# Patient Record
Sex: Male | Born: 1945
Health system: Southern US, Community
[De-identification: ages and names within clinical notes are randomized; demographics above are authoritative.]

## PROBLEM LIST (undated history)

## (undated) DIAGNOSIS — I2510 Atherosclerotic heart disease of native coronary artery without angina pectoris: Secondary | ICD-10-CM

## (undated) DIAGNOSIS — F32A Depression, unspecified: Secondary | ICD-10-CM

## (undated) DIAGNOSIS — G20A1 Parkinson's disease without dyskinesia, without mention of fluctuations: Secondary | ICD-10-CM

## (undated) DIAGNOSIS — I4891 Unspecified atrial fibrillation: Secondary | ICD-10-CM

## (undated) DIAGNOSIS — R0600 Dyspnea, unspecified: Secondary | ICD-10-CM

## (undated) DIAGNOSIS — F329 Major depressive disorder, single episode, unspecified: Secondary | ICD-10-CM

## (undated) DIAGNOSIS — M1990 Unspecified osteoarthritis, unspecified site: Secondary | ICD-10-CM

## (undated) DIAGNOSIS — I499 Cardiac arrhythmia, unspecified: Secondary | ICD-10-CM

## (undated) DIAGNOSIS — E119 Type 2 diabetes mellitus without complications: Secondary | ICD-10-CM

## (undated) DIAGNOSIS — I639 Cerebral infarction, unspecified: Secondary | ICD-10-CM

## (undated) DIAGNOSIS — F419 Anxiety disorder, unspecified: Secondary | ICD-10-CM

## (undated) DIAGNOSIS — K219 Gastro-esophageal reflux disease without esophagitis: Secondary | ICD-10-CM

## (undated) DIAGNOSIS — E785 Hyperlipidemia, unspecified: Secondary | ICD-10-CM

## (undated) DIAGNOSIS — J189 Pneumonia, unspecified organism: Secondary | ICD-10-CM

## (undated) DIAGNOSIS — G20 Parkinson's disease: Secondary | ICD-10-CM

## (undated) DIAGNOSIS — G4730 Sleep apnea, unspecified: Secondary | ICD-10-CM

## (undated) DIAGNOSIS — G709 Myoneural disorder, unspecified: Secondary | ICD-10-CM

## (undated) DIAGNOSIS — C801 Malignant (primary) neoplasm, unspecified: Secondary | ICD-10-CM

## (undated) DIAGNOSIS — I10 Essential (primary) hypertension: Secondary | ICD-10-CM

## (undated) DIAGNOSIS — I509 Heart failure, unspecified: Secondary | ICD-10-CM

## (undated) HISTORY — PX: SKIN CANCER EXCISION: SHX779

## (undated) HISTORY — DX: Atherosclerotic heart disease of native coronary artery without angina pectoris: I25.10

## (undated) HISTORY — DX: Malignant (primary) neoplasm, unspecified: C80.1

## (undated) HISTORY — DX: Essential (primary) hypertension: I10

## (undated) HISTORY — PX: ANKLE FUSION: SHX881

## (undated) HISTORY — DX: Hyperlipidemia, unspecified: E78.5

## (undated) HISTORY — DX: Type 2 diabetes mellitus without complications: E11.9

## (undated) HISTORY — DX: Parkinson's disease without dyskinesia, without mention of fluctuations: G20.A1

## (undated) HISTORY — DX: Cerebral infarction, unspecified: I63.9

## (undated) HISTORY — PX: EYE SURGERY: SHX253

## (undated) HISTORY — DX: Heart failure, unspecified: I50.9

## (undated) HISTORY — DX: Parkinson's disease: G20

## (undated) HISTORY — PX: REPLACEMENT TOTAL KNEE BILATERAL: SUR1225

## (undated) HISTORY — PX: CARDIAC CATHETERIZATION: SHX172

## (undated) HISTORY — DX: Unspecified atrial fibrillation: I48.91

---

## 2014-06-23 HISTORY — PX: JOINT REPLACEMENT: SHX530

## 2014-06-23 HISTORY — PX: LUNG LOBECTOMY: SHX167

## 2014-07-18 DIAGNOSIS — Q6100 Congenital renal cyst, unspecified: Secondary | ICD-10-CM | POA: Diagnosis not present

## 2014-07-18 DIAGNOSIS — L900 Lichen sclerosus et atrophicus: Secondary | ICD-10-CM | POA: Diagnosis not present

## 2014-07-18 DIAGNOSIS — N3941 Urge incontinence: Secondary | ICD-10-CM | POA: Diagnosis not present

## 2014-07-18 DIAGNOSIS — R3915 Urgency of urination: Secondary | ICD-10-CM | POA: Diagnosis not present

## 2014-07-18 DIAGNOSIS — R350 Frequency of micturition: Secondary | ICD-10-CM | POA: Diagnosis not present

## 2014-07-18 DIAGNOSIS — R358 Other polyuria: Secondary | ICD-10-CM | POA: Diagnosis not present

## 2014-07-18 DIAGNOSIS — R351 Nocturia: Secondary | ICD-10-CM | POA: Diagnosis not present

## 2014-07-18 DIAGNOSIS — Q5564 Hidden penis: Secondary | ICD-10-CM | POA: Diagnosis not present

## 2014-07-27 DIAGNOSIS — N4883 Acquired buried penis: Secondary | ICD-10-CM | POA: Diagnosis not present

## 2014-08-08 DIAGNOSIS — R918 Other nonspecific abnormal finding of lung field: Secondary | ICD-10-CM | POA: Diagnosis not present

## 2014-08-08 DIAGNOSIS — C3492 Malignant neoplasm of unspecified part of left bronchus or lung: Secondary | ICD-10-CM | POA: Diagnosis not present

## 2014-08-08 DIAGNOSIS — E039 Hypothyroidism, unspecified: Secondary | ICD-10-CM | POA: Diagnosis not present

## 2014-08-08 DIAGNOSIS — R0602 Shortness of breath: Secondary | ICD-10-CM | POA: Diagnosis not present

## 2014-08-08 DIAGNOSIS — I509 Heart failure, unspecified: Secondary | ICD-10-CM | POA: Diagnosis not present

## 2014-08-08 DIAGNOSIS — Z902 Acquired absence of lung [part of]: Secondary | ICD-10-CM | POA: Diagnosis not present

## 2014-08-08 DIAGNOSIS — Z7982 Long term (current) use of aspirin: Secondary | ICD-10-CM | POA: Diagnosis not present

## 2014-08-08 DIAGNOSIS — C3412 Malignant neoplasm of upper lobe, left bronchus or lung: Secondary | ICD-10-CM | POA: Diagnosis not present

## 2014-08-08 DIAGNOSIS — Z8589 Personal history of malignant neoplasm of other organs and systems: Secondary | ICD-10-CM | POA: Diagnosis not present

## 2014-08-08 DIAGNOSIS — E780 Pure hypercholesterolemia: Secondary | ICD-10-CM | POA: Diagnosis not present

## 2014-08-08 DIAGNOSIS — E119 Type 2 diabetes mellitus without complications: Secondary | ICD-10-CM | POA: Diagnosis not present

## 2014-08-08 DIAGNOSIS — I2510 Atherosclerotic heart disease of native coronary artery without angina pectoris: Secondary | ICD-10-CM | POA: Diagnosis not present

## 2014-08-08 DIAGNOSIS — R05 Cough: Secondary | ICD-10-CM | POA: Diagnosis not present

## 2014-08-17 DIAGNOSIS — R358 Other polyuria: Secondary | ICD-10-CM | POA: Diagnosis not present

## 2014-08-17 DIAGNOSIS — Q5564 Hidden penis: Secondary | ICD-10-CM | POA: Diagnosis not present

## 2014-08-17 DIAGNOSIS — L900 Lichen sclerosus et atrophicus: Secondary | ICD-10-CM | POA: Diagnosis not present

## 2014-08-17 DIAGNOSIS — R351 Nocturia: Secondary | ICD-10-CM | POA: Diagnosis not present

## 2014-08-17 DIAGNOSIS — E669 Obesity, unspecified: Secondary | ICD-10-CM | POA: Diagnosis not present

## 2014-08-17 DIAGNOSIS — R3915 Urgency of urination: Secondary | ICD-10-CM | POA: Diagnosis not present

## 2014-08-17 DIAGNOSIS — R350 Frequency of micturition: Secondary | ICD-10-CM | POA: Diagnosis not present

## 2014-08-17 DIAGNOSIS — N3941 Urge incontinence: Secondary | ICD-10-CM | POA: Diagnosis not present

## 2014-08-30 DIAGNOSIS — I2510 Atherosclerotic heart disease of native coronary artery without angina pectoris: Secondary | ICD-10-CM | POA: Diagnosis not present

## 2014-08-30 DIAGNOSIS — I4891 Unspecified atrial fibrillation: Secondary | ICD-10-CM | POA: Diagnosis not present

## 2014-09-14 DIAGNOSIS — I4891 Unspecified atrial fibrillation: Secondary | ICD-10-CM | POA: Diagnosis not present

## 2014-09-14 DIAGNOSIS — I2510 Atherosclerotic heart disease of native coronary artery without angina pectoris: Secondary | ICD-10-CM | POA: Diagnosis not present

## 2014-12-05 DIAGNOSIS — I10 Essential (primary) hypertension: Secondary | ICD-10-CM | POA: Diagnosis not present

## 2014-12-05 DIAGNOSIS — E119 Type 2 diabetes mellitus without complications: Secondary | ICD-10-CM | POA: Diagnosis not present

## 2014-12-05 DIAGNOSIS — E780 Pure hypercholesterolemia: Secondary | ICD-10-CM | POA: Diagnosis not present

## 2014-12-05 DIAGNOSIS — Z7982 Long term (current) use of aspirin: Secondary | ICD-10-CM | POA: Diagnosis not present

## 2014-12-05 DIAGNOSIS — Z0389 Encounter for observation for other suspected diseases and conditions ruled out: Secondary | ICD-10-CM | POA: Diagnosis not present

## 2014-12-05 DIAGNOSIS — C3492 Malignant neoplasm of unspecified part of left bronchus or lung: Secondary | ICD-10-CM | POA: Diagnosis not present

## 2014-12-05 DIAGNOSIS — R0602 Shortness of breath: Secondary | ICD-10-CM | POA: Diagnosis not present

## 2014-12-05 DIAGNOSIS — R918 Other nonspecific abnormal finding of lung field: Secondary | ICD-10-CM | POA: Diagnosis not present

## 2014-12-05 DIAGNOSIS — Z8673 Personal history of transient ischemic attack (TIA), and cerebral infarction without residual deficits: Secondary | ICD-10-CM | POA: Diagnosis not present

## 2014-12-05 DIAGNOSIS — Z86718 Personal history of other venous thrombosis and embolism: Secondary | ICD-10-CM | POA: Diagnosis not present

## 2014-12-05 DIAGNOSIS — Z7901 Long term (current) use of anticoagulants: Secondary | ICD-10-CM | POA: Diagnosis not present

## 2014-12-05 DIAGNOSIS — I509 Heart failure, unspecified: Secondary | ICD-10-CM | POA: Diagnosis not present

## 2014-12-05 DIAGNOSIS — E039 Hypothyroidism, unspecified: Secondary | ICD-10-CM | POA: Diagnosis not present

## 2014-12-05 DIAGNOSIS — I4891 Unspecified atrial fibrillation: Secondary | ICD-10-CM | POA: Diagnosis not present

## 2015-01-04 DIAGNOSIS — Q5564 Hidden penis: Secondary | ICD-10-CM | POA: Diagnosis not present

## 2015-01-04 DIAGNOSIS — L900 Lichen sclerosus et atrophicus: Secondary | ICD-10-CM | POA: Diagnosis not present

## 2015-01-04 DIAGNOSIS — R3915 Urgency of urination: Secondary | ICD-10-CM | POA: Diagnosis not present

## 2015-01-04 DIAGNOSIS — N3941 Urge incontinence: Secondary | ICD-10-CM | POA: Diagnosis not present

## 2015-01-04 DIAGNOSIS — E669 Obesity, unspecified: Secondary | ICD-10-CM | POA: Diagnosis not present

## 2015-01-04 DIAGNOSIS — R350 Frequency of micturition: Secondary | ICD-10-CM | POA: Diagnosis not present

## 2015-01-04 DIAGNOSIS — R351 Nocturia: Secondary | ICD-10-CM | POA: Diagnosis not present

## 2015-01-17 DIAGNOSIS — Q5564 Hidden penis: Secondary | ICD-10-CM | POA: Diagnosis not present

## 2015-02-06 DIAGNOSIS — E1165 Type 2 diabetes mellitus with hyperglycemia: Secondary | ICD-10-CM | POA: Diagnosis not present

## 2015-02-06 DIAGNOSIS — Z7901 Long term (current) use of anticoagulants: Secondary | ICD-10-CM | POA: Diagnosis not present

## 2015-02-06 DIAGNOSIS — I10 Essential (primary) hypertension: Secondary | ICD-10-CM | POA: Diagnosis not present

## 2015-02-06 DIAGNOSIS — E782 Mixed hyperlipidemia: Secondary | ICD-10-CM | POA: Diagnosis not present

## 2015-02-06 DIAGNOSIS — I259 Chronic ischemic heart disease, unspecified: Secondary | ICD-10-CM | POA: Diagnosis not present

## 2015-02-06 DIAGNOSIS — I4891 Unspecified atrial fibrillation: Secondary | ICD-10-CM | POA: Diagnosis not present

## 2015-02-28 DIAGNOSIS — M19012 Primary osteoarthritis, left shoulder: Secondary | ICD-10-CM | POA: Diagnosis not present

## 2015-04-12 DIAGNOSIS — Z23 Encounter for immunization: Secondary | ICD-10-CM | POA: Diagnosis not present

## 2015-04-12 DIAGNOSIS — E1165 Type 2 diabetes mellitus with hyperglycemia: Secondary | ICD-10-CM | POA: Diagnosis not present

## 2015-04-12 DIAGNOSIS — J209 Acute bronchitis, unspecified: Secondary | ICD-10-CM | POA: Diagnosis not present

## 2015-04-12 DIAGNOSIS — Z125 Encounter for screening for malignant neoplasm of prostate: Secondary | ICD-10-CM | POA: Diagnosis not present

## 2015-04-12 DIAGNOSIS — Z7901 Long term (current) use of anticoagulants: Secondary | ICD-10-CM | POA: Diagnosis not present

## 2015-04-25 DIAGNOSIS — I10 Essential (primary) hypertension: Secondary | ICD-10-CM | POA: Insufficient documentation

## 2015-04-25 DIAGNOSIS — E782 Mixed hyperlipidemia: Secondary | ICD-10-CM | POA: Insufficient documentation

## 2015-04-25 DIAGNOSIS — E119 Type 2 diabetes mellitus without complications: Secondary | ICD-10-CM | POA: Insufficient documentation

## 2015-04-25 DIAGNOSIS — I4891 Unspecified atrial fibrillation: Secondary | ICD-10-CM

## 2015-04-25 DIAGNOSIS — C3492 Malignant neoplasm of unspecified part of left bronchus or lung: Secondary | ICD-10-CM

## 2015-04-25 DIAGNOSIS — I482 Chronic atrial fibrillation, unspecified: Secondary | ICD-10-CM

## 2015-04-25 HISTORY — DX: Malignant neoplasm of unspecified part of left bronchus or lung: C34.92

## 2015-04-25 HISTORY — DX: Mixed hyperlipidemia: E78.2

## 2015-04-25 HISTORY — DX: Essential (primary) hypertension: I10

## 2015-04-25 HISTORY — DX: Type 2 diabetes mellitus without complications: E11.9

## 2015-04-25 HISTORY — DX: Unspecified atrial fibrillation: I48.91

## 2015-04-25 HISTORY — DX: Chronic atrial fibrillation, unspecified: I48.20

## 2015-05-15 DIAGNOSIS — S8012XA Contusion of left lower leg, initial encounter: Secondary | ICD-10-CM | POA: Diagnosis not present

## 2015-05-22 DIAGNOSIS — I482 Chronic atrial fibrillation: Secondary | ICD-10-CM | POA: Diagnosis not present

## 2015-06-05 DIAGNOSIS — E7800 Pure hypercholesterolemia, unspecified: Secondary | ICD-10-CM | POA: Diagnosis not present

## 2015-06-05 DIAGNOSIS — Z7984 Long term (current) use of oral hypoglycemic drugs: Secondary | ICD-10-CM | POA: Diagnosis not present

## 2015-06-05 DIAGNOSIS — I2510 Atherosclerotic heart disease of native coronary artery without angina pectoris: Secondary | ICD-10-CM | POA: Diagnosis not present

## 2015-06-05 DIAGNOSIS — Z08 Encounter for follow-up examination after completed treatment for malignant neoplasm: Secondary | ICD-10-CM | POA: Diagnosis not present

## 2015-06-05 DIAGNOSIS — E039 Hypothyroidism, unspecified: Secondary | ICD-10-CM | POA: Diagnosis not present

## 2015-06-05 DIAGNOSIS — I110 Hypertensive heart disease with heart failure: Secondary | ICD-10-CM | POA: Diagnosis not present

## 2015-06-05 DIAGNOSIS — Z86711 Personal history of pulmonary embolism: Secondary | ICD-10-CM | POA: Diagnosis not present

## 2015-06-05 DIAGNOSIS — Z7982 Long term (current) use of aspirin: Secondary | ICD-10-CM | POA: Diagnosis not present

## 2015-06-05 DIAGNOSIS — Z902 Acquired absence of lung [part of]: Secondary | ICD-10-CM | POA: Diagnosis not present

## 2015-06-05 DIAGNOSIS — E119 Type 2 diabetes mellitus without complications: Secondary | ICD-10-CM | POA: Diagnosis not present

## 2015-06-05 DIAGNOSIS — Z85118 Personal history of other malignant neoplasm of bronchus and lung: Secondary | ICD-10-CM | POA: Diagnosis not present

## 2015-06-05 DIAGNOSIS — Z7901 Long term (current) use of anticoagulants: Secondary | ICD-10-CM | POA: Diagnosis not present

## 2015-06-05 DIAGNOSIS — Z9221 Personal history of antineoplastic chemotherapy: Secondary | ICD-10-CM | POA: Diagnosis not present

## 2015-06-05 DIAGNOSIS — Z923 Personal history of irradiation: Secondary | ICD-10-CM | POA: Diagnosis not present

## 2015-06-05 DIAGNOSIS — I509 Heart failure, unspecified: Secondary | ICD-10-CM | POA: Diagnosis not present

## 2015-06-05 DIAGNOSIS — I4891 Unspecified atrial fibrillation: Secondary | ICD-10-CM | POA: Diagnosis not present

## 2015-06-05 DIAGNOSIS — C3492 Malignant neoplasm of unspecified part of left bronchus or lung: Secondary | ICD-10-CM | POA: Diagnosis not present

## 2015-07-09 DIAGNOSIS — Z5181 Encounter for therapeutic drug level monitoring: Secondary | ICD-10-CM | POA: Diagnosis not present

## 2015-07-09 DIAGNOSIS — E782 Mixed hyperlipidemia: Secondary | ICD-10-CM | POA: Diagnosis not present

## 2015-07-09 DIAGNOSIS — N390 Urinary tract infection, site not specified: Secondary | ICD-10-CM | POA: Diagnosis not present

## 2015-07-09 DIAGNOSIS — E1165 Type 2 diabetes mellitus with hyperglycemia: Secondary | ICD-10-CM | POA: Diagnosis not present

## 2015-07-09 DIAGNOSIS — I10 Essential (primary) hypertension: Secondary | ICD-10-CM | POA: Diagnosis not present

## 2015-08-02 DIAGNOSIS — M19012 Primary osteoarthritis, left shoulder: Secondary | ICD-10-CM | POA: Diagnosis not present

## 2015-08-06 DIAGNOSIS — M19012 Primary osteoarthritis, left shoulder: Secondary | ICD-10-CM | POA: Diagnosis not present

## 2015-08-06 DIAGNOSIS — M7552 Bursitis of left shoulder: Secondary | ICD-10-CM | POA: Diagnosis not present

## 2015-08-06 DIAGNOSIS — M25412 Effusion, left shoulder: Secondary | ICD-10-CM | POA: Diagnosis not present

## 2015-08-06 DIAGNOSIS — Z79899 Other long term (current) drug therapy: Secondary | ICD-10-CM | POA: Diagnosis not present

## 2015-08-06 DIAGNOSIS — Z01818 Encounter for other preprocedural examination: Secondary | ICD-10-CM | POA: Diagnosis not present

## 2015-08-06 DIAGNOSIS — M25512 Pain in left shoulder: Secondary | ICD-10-CM | POA: Diagnosis not present

## 2015-08-06 DIAGNOSIS — Z01810 Encounter for preprocedural cardiovascular examination: Secondary | ICD-10-CM | POA: Diagnosis not present

## 2015-08-06 DIAGNOSIS — E559 Vitamin D deficiency, unspecified: Secondary | ICD-10-CM | POA: Diagnosis not present

## 2015-08-06 DIAGNOSIS — M79609 Pain in unspecified limb: Secondary | ICD-10-CM | POA: Diagnosis not present

## 2015-08-07 DIAGNOSIS — Z01818 Encounter for other preprocedural examination: Secondary | ICD-10-CM | POA: Diagnosis not present

## 2015-08-15 DIAGNOSIS — R52 Pain, unspecified: Secondary | ICD-10-CM | POA: Diagnosis not present

## 2015-08-15 DIAGNOSIS — Z01818 Encounter for other preprocedural examination: Secondary | ICD-10-CM | POA: Diagnosis not present

## 2015-08-15 DIAGNOSIS — E559 Vitamin D deficiency, unspecified: Secondary | ICD-10-CM | POA: Diagnosis not present

## 2015-08-15 DIAGNOSIS — Z79899 Other long term (current) drug therapy: Secondary | ICD-10-CM | POA: Diagnosis not present

## 2015-08-20 DIAGNOSIS — E119 Type 2 diabetes mellitus without complications: Secondary | ICD-10-CM | POA: Diagnosis not present

## 2015-08-20 DIAGNOSIS — I10 Essential (primary) hypertension: Secondary | ICD-10-CM | POA: Diagnosis not present

## 2015-08-20 DIAGNOSIS — I482 Chronic atrial fibrillation: Secondary | ICD-10-CM | POA: Diagnosis not present

## 2015-08-20 DIAGNOSIS — E782 Mixed hyperlipidemia: Secondary | ICD-10-CM | POA: Diagnosis not present

## 2015-08-20 DIAGNOSIS — Z87891 Personal history of nicotine dependence: Secondary | ICD-10-CM | POA: Insufficient documentation

## 2015-08-20 DIAGNOSIS — C3492 Malignant neoplasm of unspecified part of left bronchus or lung: Secondary | ICD-10-CM | POA: Diagnosis not present

## 2015-08-20 DIAGNOSIS — Z01810 Encounter for preprocedural cardiovascular examination: Secondary | ICD-10-CM | POA: Diagnosis not present

## 2015-08-20 HISTORY — DX: Personal history of nicotine dependence: Z87.891

## 2015-08-23 DIAGNOSIS — Z87891 Personal history of nicotine dependence: Secondary | ICD-10-CM | POA: Diagnosis not present

## 2015-08-23 DIAGNOSIS — I482 Chronic atrial fibrillation: Secondary | ICD-10-CM | POA: Diagnosis not present

## 2015-08-23 DIAGNOSIS — Z01810 Encounter for preprocedural cardiovascular examination: Secondary | ICD-10-CM | POA: Diagnosis not present

## 2015-08-23 DIAGNOSIS — E119 Type 2 diabetes mellitus without complications: Secondary | ICD-10-CM | POA: Diagnosis not present

## 2015-08-23 DIAGNOSIS — I10 Essential (primary) hypertension: Secondary | ICD-10-CM | POA: Diagnosis not present

## 2015-08-23 DIAGNOSIS — C3492 Malignant neoplasm of unspecified part of left bronchus or lung: Secondary | ICD-10-CM | POA: Diagnosis not present

## 2015-08-23 DIAGNOSIS — E782 Mixed hyperlipidemia: Secondary | ICD-10-CM | POA: Diagnosis not present

## 2015-08-30 DIAGNOSIS — I10 Essential (primary) hypertension: Secondary | ICD-10-CM | POA: Diagnosis not present

## 2015-08-30 DIAGNOSIS — I482 Chronic atrial fibrillation: Secondary | ICD-10-CM | POA: Diagnosis not present

## 2015-08-30 DIAGNOSIS — Z01810 Encounter for preprocedural cardiovascular examination: Secondary | ICD-10-CM | POA: Diagnosis not present

## 2015-08-30 DIAGNOSIS — C3492 Malignant neoplasm of unspecified part of left bronchus or lung: Secondary | ICD-10-CM | POA: Diagnosis not present

## 2015-08-30 DIAGNOSIS — E119 Type 2 diabetes mellitus without complications: Secondary | ICD-10-CM | POA: Diagnosis not present

## 2015-08-30 DIAGNOSIS — Z87891 Personal history of nicotine dependence: Secondary | ICD-10-CM | POA: Diagnosis not present

## 2015-08-30 DIAGNOSIS — E782 Mixed hyperlipidemia: Secondary | ICD-10-CM | POA: Diagnosis not present

## 2015-09-05 DIAGNOSIS — E782 Mixed hyperlipidemia: Secondary | ICD-10-CM | POA: Diagnosis not present

## 2015-09-05 DIAGNOSIS — C3492 Malignant neoplasm of unspecified part of left bronchus or lung: Secondary | ICD-10-CM | POA: Diagnosis not present

## 2015-09-05 DIAGNOSIS — I25118 Atherosclerotic heart disease of native coronary artery with other forms of angina pectoris: Secondary | ICD-10-CM | POA: Diagnosis not present

## 2015-09-05 DIAGNOSIS — Z87891 Personal history of nicotine dependence: Secondary | ICD-10-CM | POA: Diagnosis not present

## 2015-09-05 DIAGNOSIS — E119 Type 2 diabetes mellitus without complications: Secondary | ICD-10-CM | POA: Diagnosis not present

## 2015-09-05 DIAGNOSIS — I10 Essential (primary) hypertension: Secondary | ICD-10-CM | POA: Diagnosis not present

## 2015-09-05 DIAGNOSIS — I501 Left ventricular failure: Secondary | ICD-10-CM | POA: Diagnosis not present

## 2015-09-05 DIAGNOSIS — Z9889 Other specified postprocedural states: Secondary | ICD-10-CM | POA: Diagnosis not present

## 2015-09-05 DIAGNOSIS — I482 Chronic atrial fibrillation: Secondary | ICD-10-CM | POA: Diagnosis not present

## 2015-09-05 DIAGNOSIS — Z7984 Long term (current) use of oral hypoglycemic drugs: Secondary | ICD-10-CM | POA: Diagnosis not present

## 2015-09-05 DIAGNOSIS — Z7982 Long term (current) use of aspirin: Secondary | ICD-10-CM | POA: Diagnosis not present

## 2015-09-05 DIAGNOSIS — Z79899 Other long term (current) drug therapy: Secondary | ICD-10-CM | POA: Diagnosis not present

## 2015-09-06 DIAGNOSIS — I482 Chronic atrial fibrillation: Secondary | ICD-10-CM | POA: Diagnosis not present

## 2015-09-06 DIAGNOSIS — I25118 Atherosclerotic heart disease of native coronary artery with other forms of angina pectoris: Secondary | ICD-10-CM | POA: Diagnosis not present

## 2015-09-06 DIAGNOSIS — E119 Type 2 diabetes mellitus without complications: Secondary | ICD-10-CM | POA: Diagnosis not present

## 2015-09-06 DIAGNOSIS — I10 Essential (primary) hypertension: Secondary | ICD-10-CM | POA: Diagnosis not present

## 2015-09-06 DIAGNOSIS — E782 Mixed hyperlipidemia: Secondary | ICD-10-CM | POA: Diagnosis not present

## 2015-09-06 DIAGNOSIS — I2510 Atherosclerotic heart disease of native coronary artery without angina pectoris: Secondary | ICD-10-CM | POA: Diagnosis not present

## 2015-09-06 DIAGNOSIS — Z87891 Personal history of nicotine dependence: Secondary | ICD-10-CM | POA: Diagnosis not present

## 2015-09-07 DIAGNOSIS — I482 Chronic atrial fibrillation: Secondary | ICD-10-CM | POA: Diagnosis not present

## 2015-09-07 DIAGNOSIS — I2510 Atherosclerotic heart disease of native coronary artery without angina pectoris: Secondary | ICD-10-CM | POA: Insufficient documentation

## 2015-09-07 DIAGNOSIS — E119 Type 2 diabetes mellitus without complications: Secondary | ICD-10-CM | POA: Diagnosis not present

## 2015-09-07 DIAGNOSIS — I10 Essential (primary) hypertension: Secondary | ICD-10-CM | POA: Diagnosis not present

## 2015-09-07 DIAGNOSIS — E782 Mixed hyperlipidemia: Secondary | ICD-10-CM | POA: Diagnosis not present

## 2015-09-07 DIAGNOSIS — I25118 Atherosclerotic heart disease of native coronary artery with other forms of angina pectoris: Secondary | ICD-10-CM | POA: Diagnosis not present

## 2015-09-07 DIAGNOSIS — Z87891 Personal history of nicotine dependence: Secondary | ICD-10-CM | POA: Diagnosis not present

## 2015-09-07 HISTORY — DX: Atherosclerotic heart disease of native coronary artery without angina pectoris: I25.10

## 2015-09-12 DIAGNOSIS — I482 Chronic atrial fibrillation: Secondary | ICD-10-CM | POA: Diagnosis not present

## 2015-09-12 DIAGNOSIS — Z7901 Long term (current) use of anticoagulants: Secondary | ICD-10-CM | POA: Diagnosis not present

## 2015-09-14 DIAGNOSIS — Z7901 Long term (current) use of anticoagulants: Secondary | ICD-10-CM | POA: Diagnosis not present

## 2015-09-14 DIAGNOSIS — I482 Chronic atrial fibrillation: Secondary | ICD-10-CM | POA: Diagnosis not present

## 2015-09-17 DIAGNOSIS — Z7901 Long term (current) use of anticoagulants: Secondary | ICD-10-CM | POA: Diagnosis not present

## 2015-09-17 DIAGNOSIS — I482 Chronic atrial fibrillation: Secondary | ICD-10-CM | POA: Diagnosis not present

## 2015-09-20 DIAGNOSIS — E119 Type 2 diabetes mellitus without complications: Secondary | ICD-10-CM | POA: Diagnosis not present

## 2015-09-20 DIAGNOSIS — Z794 Long term (current) use of insulin: Secondary | ICD-10-CM | POA: Diagnosis not present

## 2015-09-20 DIAGNOSIS — I10 Essential (primary) hypertension: Secondary | ICD-10-CM | POA: Diagnosis not present

## 2015-09-20 DIAGNOSIS — I482 Chronic atrial fibrillation: Secondary | ICD-10-CM | POA: Diagnosis not present

## 2015-09-20 DIAGNOSIS — I2510 Atherosclerotic heart disease of native coronary artery without angina pectoris: Secondary | ICD-10-CM | POA: Diagnosis not present

## 2015-10-01 DIAGNOSIS — E119 Type 2 diabetes mellitus without complications: Secondary | ICD-10-CM | POA: Diagnosis not present

## 2015-10-01 DIAGNOSIS — C3492 Malignant neoplasm of unspecified part of left bronchus or lung: Secondary | ICD-10-CM | POA: Diagnosis not present

## 2015-10-01 DIAGNOSIS — Z7901 Long term (current) use of anticoagulants: Secondary | ICD-10-CM | POA: Diagnosis not present

## 2015-10-01 DIAGNOSIS — I2510 Atherosclerotic heart disease of native coronary artery without angina pectoris: Secondary | ICD-10-CM | POA: Diagnosis not present

## 2015-10-01 DIAGNOSIS — Z87891 Personal history of nicotine dependence: Secondary | ICD-10-CM | POA: Diagnosis not present

## 2015-10-01 DIAGNOSIS — I482 Chronic atrial fibrillation: Secondary | ICD-10-CM | POA: Diagnosis not present

## 2015-10-01 DIAGNOSIS — E782 Mixed hyperlipidemia: Secondary | ICD-10-CM | POA: Diagnosis not present

## 2015-10-01 DIAGNOSIS — I10 Essential (primary) hypertension: Secondary | ICD-10-CM | POA: Diagnosis not present

## 2015-10-02 DIAGNOSIS — I2510 Atherosclerotic heart disease of native coronary artery without angina pectoris: Secondary | ICD-10-CM | POA: Diagnosis not present

## 2015-10-02 DIAGNOSIS — R079 Chest pain, unspecified: Secondary | ICD-10-CM | POA: Diagnosis not present

## 2015-10-15 DIAGNOSIS — M19012 Primary osteoarthritis, left shoulder: Secondary | ICD-10-CM | POA: Diagnosis not present

## 2015-10-22 DIAGNOSIS — D513 Other dietary vitamin B12 deficiency anemia: Secondary | ICD-10-CM | POA: Diagnosis not present

## 2015-10-22 DIAGNOSIS — E119 Type 2 diabetes mellitus without complications: Secondary | ICD-10-CM | POA: Diagnosis not present

## 2015-10-22 DIAGNOSIS — E782 Mixed hyperlipidemia: Secondary | ICD-10-CM | POA: Diagnosis not present

## 2015-10-22 DIAGNOSIS — I4892 Unspecified atrial flutter: Secondary | ICD-10-CM | POA: Diagnosis not present

## 2015-10-22 DIAGNOSIS — Z87891 Personal history of nicotine dependence: Secondary | ICD-10-CM | POA: Diagnosis not present

## 2015-10-22 DIAGNOSIS — Z7901 Long term (current) use of anticoagulants: Secondary | ICD-10-CM | POA: Diagnosis not present

## 2015-10-22 DIAGNOSIS — C3492 Malignant neoplasm of unspecified part of left bronchus or lung: Secondary | ICD-10-CM | POA: Diagnosis not present

## 2015-10-22 DIAGNOSIS — I2510 Atherosclerotic heart disease of native coronary artery without angina pectoris: Secondary | ICD-10-CM | POA: Diagnosis not present

## 2015-10-22 DIAGNOSIS — R5382 Chronic fatigue, unspecified: Secondary | ICD-10-CM | POA: Diagnosis not present

## 2015-10-22 DIAGNOSIS — I10 Essential (primary) hypertension: Secondary | ICD-10-CM | POA: Diagnosis not present

## 2015-10-22 DIAGNOSIS — I482 Chronic atrial fibrillation: Secondary | ICD-10-CM | POA: Diagnosis not present

## 2015-10-25 DIAGNOSIS — E1165 Type 2 diabetes mellitus with hyperglycemia: Secondary | ICD-10-CM | POA: Diagnosis not present

## 2015-10-25 DIAGNOSIS — I10 Essential (primary) hypertension: Secondary | ICD-10-CM | POA: Diagnosis not present

## 2015-10-25 DIAGNOSIS — I4891 Unspecified atrial fibrillation: Secondary | ICD-10-CM | POA: Diagnosis not present

## 2015-10-25 DIAGNOSIS — J449 Chronic obstructive pulmonary disease, unspecified: Secondary | ICD-10-CM | POA: Diagnosis not present

## 2015-10-25 DIAGNOSIS — E785 Hyperlipidemia, unspecified: Secondary | ICD-10-CM | POA: Diagnosis not present

## 2015-10-25 DIAGNOSIS — G8929 Other chronic pain: Secondary | ICD-10-CM | POA: Diagnosis not present

## 2015-10-25 DIAGNOSIS — F419 Anxiety disorder, unspecified: Secondary | ICD-10-CM | POA: Diagnosis not present

## 2015-11-05 DIAGNOSIS — I482 Chronic atrial fibrillation: Secondary | ICD-10-CM | POA: Diagnosis not present

## 2015-11-05 DIAGNOSIS — Z7901 Long term (current) use of anticoagulants: Secondary | ICD-10-CM | POA: Diagnosis not present

## 2015-11-06 DIAGNOSIS — H00019 Hordeolum externum unspecified eye, unspecified eyelid: Secondary | ICD-10-CM | POA: Diagnosis not present

## 2015-11-08 DIAGNOSIS — Z7901 Long term (current) use of anticoagulants: Secondary | ICD-10-CM | POA: Diagnosis not present

## 2015-11-08 DIAGNOSIS — I482 Chronic atrial fibrillation: Secondary | ICD-10-CM | POA: Diagnosis not present

## 2015-11-22 DIAGNOSIS — I482 Chronic atrial fibrillation: Secondary | ICD-10-CM | POA: Diagnosis not present

## 2015-11-22 DIAGNOSIS — Z7901 Long term (current) use of anticoagulants: Secondary | ICD-10-CM | POA: Diagnosis not present

## 2015-11-29 DIAGNOSIS — Z7901 Long term (current) use of anticoagulants: Secondary | ICD-10-CM | POA: Diagnosis not present

## 2015-11-29 DIAGNOSIS — I482 Chronic atrial fibrillation: Secondary | ICD-10-CM | POA: Diagnosis not present

## 2015-12-04 DIAGNOSIS — Z8673 Personal history of transient ischemic attack (TIA), and cerebral infarction without residual deficits: Secondary | ICD-10-CM | POA: Diagnosis not present

## 2015-12-04 DIAGNOSIS — E119 Type 2 diabetes mellitus without complications: Secondary | ICD-10-CM | POA: Diagnosis not present

## 2015-12-04 DIAGNOSIS — C3492 Malignant neoplasm of unspecified part of left bronchus or lung: Secondary | ICD-10-CM | POA: Diagnosis not present

## 2015-12-04 DIAGNOSIS — Z7901 Long term (current) use of anticoagulants: Secondary | ICD-10-CM | POA: Diagnosis not present

## 2015-12-04 DIAGNOSIS — Z7982 Long term (current) use of aspirin: Secondary | ICD-10-CM | POA: Diagnosis not present

## 2015-12-04 DIAGNOSIS — I10 Essential (primary) hypertension: Secondary | ICD-10-CM | POA: Diagnosis not present

## 2015-12-04 DIAGNOSIS — I509 Heart failure, unspecified: Secondary | ICD-10-CM | POA: Diagnosis not present

## 2015-12-04 DIAGNOSIS — Z86711 Personal history of pulmonary embolism: Secondary | ICD-10-CM | POA: Diagnosis not present

## 2015-12-04 DIAGNOSIS — E039 Hypothyroidism, unspecified: Secondary | ICD-10-CM | POA: Diagnosis not present

## 2015-12-04 DIAGNOSIS — Z79899 Other long term (current) drug therapy: Secondary | ICD-10-CM | POA: Diagnosis not present

## 2015-12-04 DIAGNOSIS — Z7984 Long term (current) use of oral hypoglycemic drugs: Secondary | ICD-10-CM | POA: Diagnosis not present

## 2015-12-04 DIAGNOSIS — I2510 Atherosclerotic heart disease of native coronary artery without angina pectoris: Secondary | ICD-10-CM | POA: Diagnosis not present

## 2015-12-04 DIAGNOSIS — I4891 Unspecified atrial fibrillation: Secondary | ICD-10-CM | POA: Diagnosis not present

## 2015-12-04 DIAGNOSIS — E7800 Pure hypercholesterolemia, unspecified: Secondary | ICD-10-CM | POA: Diagnosis not present

## 2015-12-13 DIAGNOSIS — R05 Cough: Secondary | ICD-10-CM | POA: Diagnosis not present

## 2015-12-13 DIAGNOSIS — I482 Chronic atrial fibrillation: Secondary | ICD-10-CM | POA: Diagnosis not present

## 2015-12-13 DIAGNOSIS — K5730 Diverticulosis of large intestine without perforation or abscess without bleeding: Secondary | ICD-10-CM | POA: Diagnosis not present

## 2015-12-13 DIAGNOSIS — R0602 Shortness of breath: Secondary | ICD-10-CM | POA: Diagnosis not present

## 2015-12-13 DIAGNOSIS — N390 Urinary tract infection, site not specified: Secondary | ICD-10-CM | POA: Diagnosis not present

## 2015-12-13 DIAGNOSIS — Z7901 Long term (current) use of anticoagulants: Secondary | ICD-10-CM | POA: Diagnosis not present

## 2015-12-13 DIAGNOSIS — N3001 Acute cystitis with hematuria: Secondary | ICD-10-CM | POA: Diagnosis not present

## 2015-12-13 DIAGNOSIS — J441 Chronic obstructive pulmonary disease with (acute) exacerbation: Secondary | ICD-10-CM | POA: Diagnosis not present

## 2015-12-20 DIAGNOSIS — Z87891 Personal history of nicotine dependence: Secondary | ICD-10-CM | POA: Diagnosis not present

## 2015-12-20 DIAGNOSIS — I10 Essential (primary) hypertension: Secondary | ICD-10-CM | POA: Diagnosis not present

## 2015-12-20 DIAGNOSIS — I2510 Atherosclerotic heart disease of native coronary artery without angina pectoris: Secondary | ICD-10-CM | POA: Diagnosis not present

## 2015-12-20 DIAGNOSIS — E119 Type 2 diabetes mellitus without complications: Secondary | ICD-10-CM | POA: Diagnosis not present

## 2015-12-20 DIAGNOSIS — I482 Chronic atrial fibrillation: Secondary | ICD-10-CM | POA: Diagnosis not present

## 2015-12-20 DIAGNOSIS — E782 Mixed hyperlipidemia: Secondary | ICD-10-CM | POA: Diagnosis not present

## 2015-12-20 DIAGNOSIS — Z794 Long term (current) use of insulin: Secondary | ICD-10-CM | POA: Diagnosis not present

## 2015-12-20 DIAGNOSIS — C3492 Malignant neoplasm of unspecified part of left bronchus or lung: Secondary | ICD-10-CM | POA: Diagnosis not present

## 2015-12-31 DIAGNOSIS — N4883 Acquired buried penis: Secondary | ICD-10-CM | POA: Diagnosis not present

## 2015-12-31 DIAGNOSIS — N471 Phimosis: Secondary | ICD-10-CM | POA: Diagnosis not present

## 2015-12-31 DIAGNOSIS — N400 Enlarged prostate without lower urinary tract symptoms: Secondary | ICD-10-CM | POA: Diagnosis not present

## 2016-01-03 DIAGNOSIS — D500 Iron deficiency anemia secondary to blood loss (chronic): Secondary | ICD-10-CM | POA: Diagnosis not present

## 2016-01-03 DIAGNOSIS — R351 Nocturia: Secondary | ICD-10-CM | POA: Diagnosis not present

## 2016-01-03 DIAGNOSIS — N401 Enlarged prostate with lower urinary tract symptoms: Secondary | ICD-10-CM | POA: Diagnosis not present

## 2016-01-03 DIAGNOSIS — I110 Hypertensive heart disease with heart failure: Secondary | ICD-10-CM | POA: Diagnosis not present

## 2016-01-03 DIAGNOSIS — E039 Hypothyroidism, unspecified: Secondary | ICD-10-CM | POA: Diagnosis not present

## 2016-01-03 DIAGNOSIS — E1165 Type 2 diabetes mellitus with hyperglycemia: Secondary | ICD-10-CM | POA: Diagnosis not present

## 2016-01-10 DIAGNOSIS — I482 Chronic atrial fibrillation: Secondary | ICD-10-CM | POA: Diagnosis not present

## 2016-01-10 DIAGNOSIS — Z7901 Long term (current) use of anticoagulants: Secondary | ICD-10-CM | POA: Diagnosis not present

## 2016-01-17 DIAGNOSIS — M19011 Primary osteoarthritis, right shoulder: Secondary | ICD-10-CM | POA: Diagnosis not present

## 2016-01-17 DIAGNOSIS — M19012 Primary osteoarthritis, left shoulder: Secondary | ICD-10-CM | POA: Diagnosis not present

## 2016-01-31 DIAGNOSIS — E119 Type 2 diabetes mellitus without complications: Secondary | ICD-10-CM | POA: Diagnosis not present

## 2016-01-31 DIAGNOSIS — H25811 Combined forms of age-related cataract, right eye: Secondary | ICD-10-CM | POA: Diagnosis not present

## 2016-01-31 DIAGNOSIS — H26492 Other secondary cataract, left eye: Secondary | ICD-10-CM | POA: Diagnosis not present

## 2016-02-04 DIAGNOSIS — Z0000 Encounter for general adult medical examination without abnormal findings: Secondary | ICD-10-CM | POA: Diagnosis not present

## 2016-02-04 DIAGNOSIS — E559 Vitamin D deficiency, unspecified: Secondary | ICD-10-CM | POA: Diagnosis not present

## 2016-02-04 DIAGNOSIS — I2510 Atherosclerotic heart disease of native coronary artery without angina pectoris: Secondary | ICD-10-CM | POA: Diagnosis not present

## 2016-02-04 DIAGNOSIS — Z9861 Coronary angioplasty status: Secondary | ICD-10-CM | POA: Diagnosis not present

## 2016-02-04 DIAGNOSIS — D500 Iron deficiency anemia secondary to blood loss (chronic): Secondary | ICD-10-CM | POA: Diagnosis not present

## 2016-02-04 DIAGNOSIS — G629 Polyneuropathy, unspecified: Secondary | ICD-10-CM | POA: Diagnosis not present

## 2016-02-07 DIAGNOSIS — M19012 Primary osteoarthritis, left shoulder: Secondary | ICD-10-CM | POA: Diagnosis not present

## 2016-02-08 DIAGNOSIS — Z7901 Long term (current) use of anticoagulants: Secondary | ICD-10-CM | POA: Diagnosis not present

## 2016-02-08 DIAGNOSIS — I482 Chronic atrial fibrillation: Secondary | ICD-10-CM | POA: Diagnosis not present

## 2016-02-19 DIAGNOSIS — Z794 Long term (current) use of insulin: Secondary | ICD-10-CM | POA: Diagnosis not present

## 2016-02-19 DIAGNOSIS — I2510 Atherosclerotic heart disease of native coronary artery without angina pectoris: Secondary | ICD-10-CM | POA: Diagnosis not present

## 2016-02-19 DIAGNOSIS — I482 Chronic atrial fibrillation: Secondary | ICD-10-CM | POA: Diagnosis not present

## 2016-02-19 DIAGNOSIS — E119 Type 2 diabetes mellitus without complications: Secondary | ICD-10-CM | POA: Diagnosis not present

## 2016-02-19 DIAGNOSIS — I10 Essential (primary) hypertension: Secondary | ICD-10-CM | POA: Diagnosis not present

## 2016-02-19 DIAGNOSIS — E782 Mixed hyperlipidemia: Secondary | ICD-10-CM | POA: Diagnosis not present

## 2016-02-19 DIAGNOSIS — C3492 Malignant neoplasm of unspecified part of left bronchus or lung: Secondary | ICD-10-CM | POA: Diagnosis not present

## 2016-03-03 DIAGNOSIS — H25811 Combined forms of age-related cataract, right eye: Secondary | ICD-10-CM | POA: Diagnosis not present

## 2016-03-03 DIAGNOSIS — Z7901 Long term (current) use of anticoagulants: Secondary | ICD-10-CM | POA: Diagnosis not present

## 2016-03-03 DIAGNOSIS — H2511 Age-related nuclear cataract, right eye: Secondary | ICD-10-CM | POA: Diagnosis not present

## 2016-03-03 DIAGNOSIS — E119 Type 2 diabetes mellitus without complications: Secondary | ICD-10-CM | POA: Diagnosis not present

## 2016-03-07 DIAGNOSIS — I482 Chronic atrial fibrillation: Secondary | ICD-10-CM | POA: Diagnosis not present

## 2016-03-07 DIAGNOSIS — Z7901 Long term (current) use of anticoagulants: Secondary | ICD-10-CM | POA: Diagnosis not present

## 2016-03-17 DIAGNOSIS — I110 Hypertensive heart disease with heart failure: Secondary | ICD-10-CM | POA: Diagnosis not present

## 2016-03-17 DIAGNOSIS — B9689 Other specified bacterial agents as the cause of diseases classified elsewhere: Secondary | ICD-10-CM | POA: Diagnosis not present

## 2016-03-17 DIAGNOSIS — J208 Acute bronchitis due to other specified organisms: Secondary | ICD-10-CM | POA: Diagnosis not present

## 2016-03-21 DIAGNOSIS — Z7901 Long term (current) use of anticoagulants: Secondary | ICD-10-CM | POA: Diagnosis not present

## 2016-03-21 DIAGNOSIS — I482 Chronic atrial fibrillation: Secondary | ICD-10-CM | POA: Diagnosis not present

## 2016-04-14 DIAGNOSIS — I2510 Atherosclerotic heart disease of native coronary artery without angina pectoris: Secondary | ICD-10-CM | POA: Diagnosis not present

## 2016-04-14 DIAGNOSIS — Z23 Encounter for immunization: Secondary | ICD-10-CM | POA: Diagnosis not present

## 2016-04-14 DIAGNOSIS — Z9861 Coronary angioplasty status: Secondary | ICD-10-CM | POA: Diagnosis not present

## 2016-04-14 DIAGNOSIS — E1129 Type 2 diabetes mellitus with other diabetic kidney complication: Secondary | ICD-10-CM | POA: Diagnosis not present

## 2016-04-14 DIAGNOSIS — I110 Hypertensive heart disease with heart failure: Secondary | ICD-10-CM | POA: Diagnosis not present

## 2016-04-28 DIAGNOSIS — Z7901 Long term (current) use of anticoagulants: Secondary | ICD-10-CM | POA: Diagnosis not present

## 2016-04-28 DIAGNOSIS — I482 Chronic atrial fibrillation: Secondary | ICD-10-CM | POA: Diagnosis not present

## 2016-05-07 DIAGNOSIS — M19011 Primary osteoarthritis, right shoulder: Secondary | ICD-10-CM | POA: Diagnosis not present

## 2016-05-20 DIAGNOSIS — I482 Chronic atrial fibrillation: Secondary | ICD-10-CM | POA: Diagnosis not present

## 2016-05-20 DIAGNOSIS — Z7901 Long term (current) use of anticoagulants: Secondary | ICD-10-CM | POA: Diagnosis not present

## 2016-05-26 DIAGNOSIS — I2510 Atherosclerotic heart disease of native coronary artery without angina pectoris: Secondary | ICD-10-CM | POA: Diagnosis not present

## 2016-05-26 DIAGNOSIS — E782 Mixed hyperlipidemia: Secondary | ICD-10-CM | POA: Diagnosis not present

## 2016-05-26 DIAGNOSIS — I10 Essential (primary) hypertension: Secondary | ICD-10-CM | POA: Diagnosis not present

## 2016-05-26 DIAGNOSIS — I482 Chronic atrial fibrillation: Secondary | ICD-10-CM | POA: Diagnosis not present

## 2016-05-26 DIAGNOSIS — Z794 Long term (current) use of insulin: Secondary | ICD-10-CM | POA: Diagnosis not present

## 2016-05-26 DIAGNOSIS — E119 Type 2 diabetes mellitus without complications: Secondary | ICD-10-CM | POA: Diagnosis not present

## 2016-05-26 DIAGNOSIS — C3492 Malignant neoplasm of unspecified part of left bronchus or lung: Secondary | ICD-10-CM | POA: Diagnosis not present

## 2016-05-30 DIAGNOSIS — L900 Lichen sclerosus et atrophicus: Secondary | ICD-10-CM | POA: Diagnosis not present

## 2016-05-30 DIAGNOSIS — R3915 Urgency of urination: Secondary | ICD-10-CM | POA: Diagnosis not present

## 2016-05-30 DIAGNOSIS — R350 Frequency of micturition: Secondary | ICD-10-CM | POA: Diagnosis not present

## 2016-05-30 DIAGNOSIS — N4883 Acquired buried penis: Secondary | ICD-10-CM | POA: Diagnosis not present

## 2016-05-30 DIAGNOSIS — R309 Painful micturition, unspecified: Secondary | ICD-10-CM | POA: Diagnosis not present

## 2016-05-30 DIAGNOSIS — E669 Obesity, unspecified: Secondary | ICD-10-CM | POA: Diagnosis not present

## 2016-05-30 DIAGNOSIS — R351 Nocturia: Secondary | ICD-10-CM | POA: Diagnosis not present

## 2016-05-30 DIAGNOSIS — R358 Other polyuria: Secondary | ICD-10-CM | POA: Diagnosis not present

## 2016-05-30 DIAGNOSIS — N3941 Urge incontinence: Secondary | ICD-10-CM | POA: Diagnosis not present

## 2016-06-03 DIAGNOSIS — J329 Chronic sinusitis, unspecified: Secondary | ICD-10-CM | POA: Diagnosis not present

## 2016-06-03 DIAGNOSIS — E7800 Pure hypercholesterolemia, unspecified: Secondary | ICD-10-CM | POA: Diagnosis not present

## 2016-06-03 DIAGNOSIS — Z7984 Long term (current) use of oral hypoglycemic drugs: Secondary | ICD-10-CM | POA: Diagnosis not present

## 2016-06-03 DIAGNOSIS — Z79899 Other long term (current) drug therapy: Secondary | ICD-10-CM | POA: Diagnosis not present

## 2016-06-03 DIAGNOSIS — C3492 Malignant neoplasm of unspecified part of left bronchus or lung: Secondary | ICD-10-CM | POA: Diagnosis not present

## 2016-06-03 DIAGNOSIS — I509 Heart failure, unspecified: Secondary | ICD-10-CM | POA: Diagnosis not present

## 2016-06-03 DIAGNOSIS — I110 Hypertensive heart disease with heart failure: Secondary | ICD-10-CM | POA: Diagnosis not present

## 2016-06-03 DIAGNOSIS — I2510 Atherosclerotic heart disease of native coronary artery without angina pectoris: Secondary | ICD-10-CM | POA: Diagnosis not present

## 2016-06-03 DIAGNOSIS — Z7982 Long term (current) use of aspirin: Secondary | ICD-10-CM | POA: Diagnosis not present

## 2016-06-03 DIAGNOSIS — Z7902 Long term (current) use of antithrombotics/antiplatelets: Secondary | ICD-10-CM | POA: Diagnosis not present

## 2016-06-03 DIAGNOSIS — Z7901 Long term (current) use of anticoagulants: Secondary | ICD-10-CM | POA: Diagnosis not present

## 2016-06-03 DIAGNOSIS — Z8673 Personal history of transient ischemic attack (TIA), and cerebral infarction without residual deficits: Secondary | ICD-10-CM | POA: Diagnosis not present

## 2016-06-03 DIAGNOSIS — E119 Type 2 diabetes mellitus without complications: Secondary | ICD-10-CM | POA: Diagnosis not present

## 2016-06-03 DIAGNOSIS — I4891 Unspecified atrial fibrillation: Secondary | ICD-10-CM | POA: Diagnosis not present

## 2016-06-03 DIAGNOSIS — E039 Hypothyroidism, unspecified: Secondary | ICD-10-CM | POA: Diagnosis not present

## 2016-06-05 DIAGNOSIS — E7800 Pure hypercholesterolemia, unspecified: Secondary | ICD-10-CM | POA: Insufficient documentation

## 2016-06-05 DIAGNOSIS — E039 Hypothyroidism, unspecified: Secondary | ICD-10-CM | POA: Insufficient documentation

## 2016-06-05 DIAGNOSIS — F411 Generalized anxiety disorder: Secondary | ICD-10-CM | POA: Insufficient documentation

## 2016-06-05 DIAGNOSIS — F419 Anxiety disorder, unspecified: Secondary | ICD-10-CM | POA: Insufficient documentation

## 2016-06-05 HISTORY — DX: Generalized anxiety disorder: F41.1

## 2016-06-05 HISTORY — DX: Pure hypercholesterolemia, unspecified: E78.00

## 2016-06-05 HISTORY — DX: Hypothyroidism, unspecified: E03.9

## 2016-06-09 DIAGNOSIS — E119 Type 2 diabetes mellitus without complications: Secondary | ICD-10-CM | POA: Diagnosis not present

## 2016-06-09 DIAGNOSIS — E782 Mixed hyperlipidemia: Secondary | ICD-10-CM | POA: Diagnosis not present

## 2016-06-09 DIAGNOSIS — I2510 Atherosclerotic heart disease of native coronary artery without angina pectoris: Secondary | ICD-10-CM | POA: Diagnosis not present

## 2016-06-09 DIAGNOSIS — I482 Chronic atrial fibrillation: Secondary | ICD-10-CM | POA: Diagnosis not present

## 2016-06-09 DIAGNOSIS — I10 Essential (primary) hypertension: Secondary | ICD-10-CM | POA: Diagnosis not present

## 2016-06-10 DIAGNOSIS — E119 Type 2 diabetes mellitus without complications: Secondary | ICD-10-CM | POA: Diagnosis not present

## 2016-06-10 DIAGNOSIS — E782 Mixed hyperlipidemia: Secondary | ICD-10-CM | POA: Diagnosis not present

## 2016-06-10 DIAGNOSIS — I2510 Atherosclerotic heart disease of native coronary artery without angina pectoris: Secondary | ICD-10-CM | POA: Diagnosis not present

## 2016-06-10 DIAGNOSIS — I10 Essential (primary) hypertension: Secondary | ICD-10-CM | POA: Diagnosis not present

## 2016-06-10 DIAGNOSIS — I482 Chronic atrial fibrillation: Secondary | ICD-10-CM | POA: Diagnosis not present

## 2016-06-10 DIAGNOSIS — R079 Chest pain, unspecified: Secondary | ICD-10-CM | POA: Diagnosis not present

## 2016-06-12 DIAGNOSIS — I25119 Atherosclerotic heart disease of native coronary artery with unspecified angina pectoris: Secondary | ICD-10-CM | POA: Insufficient documentation

## 2016-06-12 DIAGNOSIS — R9439 Abnormal result of other cardiovascular function study: Secondary | ICD-10-CM

## 2016-06-12 HISTORY — DX: Abnormal result of other cardiovascular function study: R94.39

## 2016-06-12 HISTORY — DX: Atherosclerotic heart disease of native coronary artery with unspecified angina pectoris: I25.119

## 2016-06-17 DIAGNOSIS — Z7901 Long term (current) use of anticoagulants: Secondary | ICD-10-CM | POA: Diagnosis not present

## 2016-06-17 DIAGNOSIS — I25119 Atherosclerotic heart disease of native coronary artery with unspecified angina pectoris: Secondary | ICD-10-CM | POA: Diagnosis not present

## 2016-06-17 DIAGNOSIS — I2510 Atherosclerotic heart disease of native coronary artery without angina pectoris: Secondary | ICD-10-CM | POA: Diagnosis not present

## 2016-06-17 DIAGNOSIS — I482 Chronic atrial fibrillation: Secondary | ICD-10-CM | POA: Diagnosis not present

## 2016-06-17 DIAGNOSIS — I10 Essential (primary) hypertension: Secondary | ICD-10-CM | POA: Diagnosis not present

## 2016-06-17 DIAGNOSIS — R9439 Abnormal result of other cardiovascular function study: Secondary | ICD-10-CM | POA: Diagnosis not present

## 2016-06-17 DIAGNOSIS — E782 Mixed hyperlipidemia: Secondary | ICD-10-CM | POA: Diagnosis not present

## 2016-06-17 DIAGNOSIS — R918 Other nonspecific abnormal finding of lung field: Secondary | ICD-10-CM | POA: Diagnosis not present

## 2016-06-19 DIAGNOSIS — E119 Type 2 diabetes mellitus without complications: Secondary | ICD-10-CM | POA: Diagnosis not present

## 2016-06-19 DIAGNOSIS — I482 Chronic atrial fibrillation: Secondary | ICD-10-CM | POA: Diagnosis not present

## 2016-06-19 DIAGNOSIS — C3492 Malignant neoplasm of unspecified part of left bronchus or lung: Secondary | ICD-10-CM | POA: Diagnosis not present

## 2016-06-19 DIAGNOSIS — R079 Chest pain, unspecified: Secondary | ICD-10-CM | POA: Diagnosis not present

## 2016-06-19 DIAGNOSIS — I25118 Atherosclerotic heart disease of native coronary artery with other forms of angina pectoris: Secondary | ICD-10-CM | POA: Diagnosis not present

## 2016-06-19 DIAGNOSIS — I2510 Atherosclerotic heart disease of native coronary artery without angina pectoris: Secondary | ICD-10-CM | POA: Diagnosis not present

## 2016-06-19 DIAGNOSIS — I10 Essential (primary) hypertension: Secondary | ICD-10-CM | POA: Diagnosis not present

## 2016-06-19 DIAGNOSIS — I712 Thoracic aortic aneurysm, without rupture: Secondary | ICD-10-CM | POA: Diagnosis not present

## 2016-06-19 DIAGNOSIS — R0602 Shortness of breath: Secondary | ICD-10-CM | POA: Diagnosis not present

## 2016-06-19 DIAGNOSIS — Z79899 Other long term (current) drug therapy: Secondary | ICD-10-CM | POA: Diagnosis not present

## 2016-06-27 DIAGNOSIS — Z7901 Long term (current) use of anticoagulants: Secondary | ICD-10-CM | POA: Diagnosis not present

## 2016-06-27 DIAGNOSIS — I482 Chronic atrial fibrillation: Secondary | ICD-10-CM | POA: Diagnosis not present

## 2016-07-11 DIAGNOSIS — G4733 Obstructive sleep apnea (adult) (pediatric): Secondary | ICD-10-CM

## 2016-07-11 DIAGNOSIS — K219 Gastro-esophageal reflux disease without esophagitis: Secondary | ICD-10-CM | POA: Insufficient documentation

## 2016-07-11 HISTORY — DX: Obstructive sleep apnea (adult) (pediatric): G47.33

## 2016-07-14 DIAGNOSIS — Q5564 Hidden penis: Secondary | ICD-10-CM | POA: Diagnosis not present

## 2016-07-14 DIAGNOSIS — E7800 Pure hypercholesterolemia, unspecified: Secondary | ICD-10-CM | POA: Diagnosis not present

## 2016-07-14 DIAGNOSIS — E669 Obesity, unspecified: Secondary | ICD-10-CM | POA: Diagnosis not present

## 2016-07-14 DIAGNOSIS — G4733 Obstructive sleep apnea (adult) (pediatric): Secondary | ICD-10-CM | POA: Diagnosis not present

## 2016-07-14 DIAGNOSIS — N400 Enlarged prostate without lower urinary tract symptoms: Secondary | ICD-10-CM | POA: Diagnosis not present

## 2016-07-14 DIAGNOSIS — Z87891 Personal history of nicotine dependence: Secondary | ICD-10-CM | POA: Diagnosis not present

## 2016-07-14 DIAGNOSIS — I2510 Atherosclerotic heart disease of native coronary artery without angina pectoris: Secondary | ICD-10-CM | POA: Diagnosis not present

## 2016-07-14 DIAGNOSIS — Z85118 Personal history of other malignant neoplasm of bronchus and lung: Secondary | ICD-10-CM | POA: Diagnosis not present

## 2016-07-14 DIAGNOSIS — Z7902 Long term (current) use of antithrombotics/antiplatelets: Secondary | ICD-10-CM | POA: Diagnosis not present

## 2016-07-14 DIAGNOSIS — F329 Major depressive disorder, single episode, unspecified: Secondary | ICD-10-CM | POA: Diagnosis not present

## 2016-07-14 DIAGNOSIS — Z955 Presence of coronary angioplasty implant and graft: Secondary | ICD-10-CM | POA: Diagnosis not present

## 2016-07-14 DIAGNOSIS — L905 Scar conditions and fibrosis of skin: Secondary | ICD-10-CM | POA: Diagnosis not present

## 2016-07-14 DIAGNOSIS — K219 Gastro-esophageal reflux disease without esophagitis: Secondary | ICD-10-CM | POA: Diagnosis not present

## 2016-07-14 DIAGNOSIS — E039 Hypothyroidism, unspecified: Secondary | ICD-10-CM | POA: Diagnosis not present

## 2016-07-14 DIAGNOSIS — F419 Anxiety disorder, unspecified: Secondary | ICD-10-CM | POA: Diagnosis not present

## 2016-07-14 DIAGNOSIS — I509 Heart failure, unspecified: Secondary | ICD-10-CM | POA: Diagnosis not present

## 2016-07-14 DIAGNOSIS — Z6841 Body Mass Index (BMI) 40.0 and over, adult: Secondary | ICD-10-CM | POA: Diagnosis not present

## 2016-07-14 DIAGNOSIS — I110 Hypertensive heart disease with heart failure: Secondary | ICD-10-CM | POA: Diagnosis not present

## 2016-07-14 DIAGNOSIS — Z7982 Long term (current) use of aspirin: Secondary | ICD-10-CM | POA: Diagnosis not present

## 2016-07-14 DIAGNOSIS — Z8673 Personal history of transient ischemic attack (TIA), and cerebral infarction without residual deficits: Secondary | ICD-10-CM | POA: Diagnosis not present

## 2016-07-14 DIAGNOSIS — Z7901 Long term (current) use of anticoagulants: Secondary | ICD-10-CM | POA: Diagnosis not present

## 2016-07-14 DIAGNOSIS — N4883 Acquired buried penis: Secondary | ICD-10-CM | POA: Diagnosis not present

## 2016-07-14 DIAGNOSIS — Z79899 Other long term (current) drug therapy: Secondary | ICD-10-CM | POA: Diagnosis not present

## 2016-07-14 DIAGNOSIS — I4891 Unspecified atrial fibrillation: Secondary | ICD-10-CM | POA: Diagnosis not present

## 2016-07-14 DIAGNOSIS — E119 Type 2 diabetes mellitus without complications: Secondary | ICD-10-CM | POA: Diagnosis not present

## 2016-07-14 DIAGNOSIS — M1990 Unspecified osteoarthritis, unspecified site: Secondary | ICD-10-CM | POA: Diagnosis not present

## 2016-07-14 DIAGNOSIS — N4889 Other specified disorders of penis: Secondary | ICD-10-CM | POA: Diagnosis not present

## 2016-07-21 DIAGNOSIS — C3492 Malignant neoplasm of unspecified part of left bronchus or lung: Secondary | ICD-10-CM | POA: Diagnosis not present

## 2016-07-21 DIAGNOSIS — I10 Essential (primary) hypertension: Secondary | ICD-10-CM | POA: Diagnosis not present

## 2016-07-21 DIAGNOSIS — I2510 Atherosclerotic heart disease of native coronary artery without angina pectoris: Secondary | ICD-10-CM | POA: Diagnosis not present

## 2016-07-21 DIAGNOSIS — Z794 Long term (current) use of insulin: Secondary | ICD-10-CM | POA: Diagnosis not present

## 2016-07-21 DIAGNOSIS — E119 Type 2 diabetes mellitus without complications: Secondary | ICD-10-CM | POA: Diagnosis not present

## 2016-07-21 DIAGNOSIS — E782 Mixed hyperlipidemia: Secondary | ICD-10-CM | POA: Diagnosis not present

## 2016-07-21 DIAGNOSIS — I482 Chronic atrial fibrillation: Secondary | ICD-10-CM | POA: Diagnosis not present

## 2016-07-28 DIAGNOSIS — L300 Nummular dermatitis: Secondary | ICD-10-CM | POA: Diagnosis not present

## 2016-07-28 DIAGNOSIS — L578 Other skin changes due to chronic exposure to nonionizing radiation: Secondary | ICD-10-CM | POA: Diagnosis not present

## 2016-07-28 DIAGNOSIS — L570 Actinic keratosis: Secondary | ICD-10-CM | POA: Diagnosis not present

## 2016-07-28 DIAGNOSIS — L219 Seborrheic dermatitis, unspecified: Secondary | ICD-10-CM | POA: Diagnosis not present

## 2016-07-30 DIAGNOSIS — R52 Pain, unspecified: Secondary | ICD-10-CM | POA: Diagnosis not present

## 2016-07-30 DIAGNOSIS — M25511 Pain in right shoulder: Secondary | ICD-10-CM | POA: Diagnosis not present

## 2016-07-30 DIAGNOSIS — M79609 Pain in unspecified limb: Secondary | ICD-10-CM | POA: Diagnosis not present

## 2016-07-30 DIAGNOSIS — Z01818 Encounter for other preprocedural examination: Secondary | ICD-10-CM | POA: Diagnosis not present

## 2016-07-30 DIAGNOSIS — E559 Vitamin D deficiency, unspecified: Secondary | ICD-10-CM | POA: Diagnosis not present

## 2016-07-30 DIAGNOSIS — M25512 Pain in left shoulder: Secondary | ICD-10-CM | POA: Diagnosis not present

## 2016-07-30 DIAGNOSIS — G8929 Other chronic pain: Secondary | ICD-10-CM | POA: Diagnosis not present

## 2016-07-30 DIAGNOSIS — Z79899 Other long term (current) drug therapy: Secondary | ICD-10-CM | POA: Diagnosis not present

## 2016-07-31 DIAGNOSIS — Z01810 Encounter for preprocedural cardiovascular examination: Secondary | ICD-10-CM | POA: Diagnosis not present

## 2016-07-31 DIAGNOSIS — Z79899 Other long term (current) drug therapy: Secondary | ICD-10-CM | POA: Diagnosis not present

## 2016-07-31 DIAGNOSIS — Z01818 Encounter for other preprocedural examination: Secondary | ICD-10-CM | POA: Diagnosis not present

## 2016-07-31 DIAGNOSIS — E559 Vitamin D deficiency, unspecified: Secondary | ICD-10-CM | POA: Diagnosis not present

## 2016-07-31 DIAGNOSIS — Z01812 Encounter for preprocedural laboratory examination: Secondary | ICD-10-CM | POA: Diagnosis not present

## 2016-07-31 DIAGNOSIS — M79609 Pain in unspecified limb: Secondary | ICD-10-CM | POA: Diagnosis not present

## 2016-07-31 DIAGNOSIS — R918 Other nonspecific abnormal finding of lung field: Secondary | ICD-10-CM | POA: Diagnosis not present

## 2016-09-02 DIAGNOSIS — I482 Chronic atrial fibrillation: Secondary | ICD-10-CM | POA: Diagnosis not present

## 2016-09-02 DIAGNOSIS — Z7901 Long term (current) use of anticoagulants: Secondary | ICD-10-CM | POA: Diagnosis not present

## 2016-09-04 DIAGNOSIS — M19012 Primary osteoarthritis, left shoulder: Secondary | ICD-10-CM | POA: Diagnosis not present

## 2016-09-09 DIAGNOSIS — E7800 Pure hypercholesterolemia, unspecified: Secondary | ICD-10-CM | POA: Diagnosis present

## 2016-09-09 DIAGNOSIS — F418 Other specified anxiety disorders: Secondary | ICD-10-CM | POA: Diagnosis present

## 2016-09-09 DIAGNOSIS — Z955 Presence of coronary angioplasty implant and graft: Secondary | ICD-10-CM | POA: Diagnosis not present

## 2016-09-09 DIAGNOSIS — E039 Hypothyroidism, unspecified: Secondary | ICD-10-CM | POA: Diagnosis present

## 2016-09-09 DIAGNOSIS — Z96612 Presence of left artificial shoulder joint: Secondary | ICD-10-CM | POA: Diagnosis not present

## 2016-09-09 DIAGNOSIS — Z471 Aftercare following joint replacement surgery: Secondary | ICD-10-CM | POA: Diagnosis not present

## 2016-09-09 DIAGNOSIS — Z7902 Long term (current) use of antithrombotics/antiplatelets: Secondary | ICD-10-CM | POA: Diagnosis not present

## 2016-09-09 DIAGNOSIS — M19012 Primary osteoarthritis, left shoulder: Secondary | ICD-10-CM | POA: Diagnosis not present

## 2016-09-09 DIAGNOSIS — E669 Obesity, unspecified: Secondary | ICD-10-CM | POA: Diagnosis present

## 2016-09-09 DIAGNOSIS — Z8673 Personal history of transient ischemic attack (TIA), and cerebral infarction without residual deficits: Secondary | ICD-10-CM | POA: Diagnosis not present

## 2016-09-09 DIAGNOSIS — Z79899 Other long term (current) drug therapy: Secondary | ICD-10-CM | POA: Diagnosis not present

## 2016-09-09 DIAGNOSIS — Z85118 Personal history of other malignant neoplasm of bronchus and lung: Secondary | ICD-10-CM | POA: Diagnosis not present

## 2016-09-09 DIAGNOSIS — I2510 Atherosclerotic heart disease of native coronary artery without angina pectoris: Secondary | ICD-10-CM | POA: Diagnosis not present

## 2016-09-09 DIAGNOSIS — I119 Hypertensive heart disease without heart failure: Secondary | ICD-10-CM | POA: Diagnosis present

## 2016-09-09 DIAGNOSIS — E119 Type 2 diabetes mellitus without complications: Secondary | ICD-10-CM | POA: Diagnosis not present

## 2016-09-09 DIAGNOSIS — K219 Gastro-esophageal reflux disease without esophagitis: Secondary | ICD-10-CM | POA: Diagnosis present

## 2016-09-09 DIAGNOSIS — Z7982 Long term (current) use of aspirin: Secondary | ICD-10-CM | POA: Diagnosis not present

## 2016-09-09 DIAGNOSIS — G8918 Other acute postprocedural pain: Secondary | ICD-10-CM | POA: Diagnosis not present

## 2016-09-09 DIAGNOSIS — Z681 Body mass index (BMI) 19 or less, adult: Secondary | ICD-10-CM | POA: Diagnosis not present

## 2016-09-09 DIAGNOSIS — Z87891 Personal history of nicotine dependence: Secondary | ICD-10-CM | POA: Diagnosis not present

## 2016-09-09 DIAGNOSIS — Z7901 Long term (current) use of anticoagulants: Secondary | ICD-10-CM | POA: Diagnosis not present

## 2016-09-09 DIAGNOSIS — I10 Essential (primary) hypertension: Secondary | ICD-10-CM | POA: Diagnosis not present

## 2016-09-09 DIAGNOSIS — Z902 Acquired absence of lung [part of]: Secondary | ICD-10-CM | POA: Diagnosis not present

## 2016-09-09 DIAGNOSIS — I482 Chronic atrial fibrillation: Secondary | ICD-10-CM | POA: Diagnosis not present

## 2016-09-09 DIAGNOSIS — E1169 Type 2 diabetes mellitus with other specified complication: Secondary | ICD-10-CM | POA: Diagnosis not present

## 2016-09-23 DIAGNOSIS — M25512 Pain in left shoulder: Secondary | ICD-10-CM | POA: Diagnosis not present

## 2016-09-23 DIAGNOSIS — M19012 Primary osteoarthritis, left shoulder: Secondary | ICD-10-CM | POA: Diagnosis not present

## 2016-09-23 DIAGNOSIS — M6281 Muscle weakness (generalized): Secondary | ICD-10-CM | POA: Diagnosis not present

## 2016-09-25 DIAGNOSIS — M6281 Muscle weakness (generalized): Secondary | ICD-10-CM | POA: Diagnosis not present

## 2016-09-25 DIAGNOSIS — M25512 Pain in left shoulder: Secondary | ICD-10-CM | POA: Diagnosis not present

## 2016-09-25 DIAGNOSIS — R0600 Dyspnea, unspecified: Secondary | ICD-10-CM | POA: Diagnosis not present

## 2016-09-25 DIAGNOSIS — E1165 Type 2 diabetes mellitus with hyperglycemia: Secondary | ICD-10-CM | POA: Diagnosis not present

## 2016-09-25 DIAGNOSIS — E872 Acidosis: Secondary | ICD-10-CM | POA: Diagnosis not present

## 2016-09-25 DIAGNOSIS — M19012 Primary osteoarthritis, left shoulder: Secondary | ICD-10-CM | POA: Diagnosis not present

## 2016-09-25 DIAGNOSIS — N3000 Acute cystitis without hematuria: Secondary | ICD-10-CM | POA: Diagnosis not present

## 2016-09-25 DIAGNOSIS — R0602 Shortness of breath: Secondary | ICD-10-CM | POA: Diagnosis not present

## 2016-09-30 DIAGNOSIS — M25512 Pain in left shoulder: Secondary | ICD-10-CM | POA: Diagnosis not present

## 2016-09-30 DIAGNOSIS — M19012 Primary osteoarthritis, left shoulder: Secondary | ICD-10-CM | POA: Diagnosis not present

## 2016-09-30 DIAGNOSIS — M6281 Muscle weakness (generalized): Secondary | ICD-10-CM | POA: Diagnosis not present

## 2016-10-02 DIAGNOSIS — M25512 Pain in left shoulder: Secondary | ICD-10-CM | POA: Diagnosis not present

## 2016-10-02 DIAGNOSIS — M6281 Muscle weakness (generalized): Secondary | ICD-10-CM | POA: Diagnosis not present

## 2016-10-02 DIAGNOSIS — M19012 Primary osteoarthritis, left shoulder: Secondary | ICD-10-CM | POA: Diagnosis not present

## 2016-10-07 DIAGNOSIS — M19012 Primary osteoarthritis, left shoulder: Secondary | ICD-10-CM | POA: Diagnosis not present

## 2016-10-07 DIAGNOSIS — M6281 Muscle weakness (generalized): Secondary | ICD-10-CM | POA: Diagnosis not present

## 2016-10-07 DIAGNOSIS — M25512 Pain in left shoulder: Secondary | ICD-10-CM | POA: Diagnosis not present

## 2016-10-14 DIAGNOSIS — M25512 Pain in left shoulder: Secondary | ICD-10-CM | POA: Diagnosis not present

## 2016-10-14 DIAGNOSIS — M6281 Muscle weakness (generalized): Secondary | ICD-10-CM | POA: Diagnosis not present

## 2016-10-14 DIAGNOSIS — M19012 Primary osteoarthritis, left shoulder: Secondary | ICD-10-CM | POA: Diagnosis not present

## 2016-10-15 DIAGNOSIS — J449 Chronic obstructive pulmonary disease, unspecified: Secondary | ICD-10-CM | POA: Diagnosis not present

## 2016-10-15 DIAGNOSIS — I482 Chronic atrial fibrillation: Secondary | ICD-10-CM | POA: Diagnosis not present

## 2016-10-15 DIAGNOSIS — Z6841 Body Mass Index (BMI) 40.0 and over, adult: Secondary | ICD-10-CM | POA: Diagnosis not present

## 2016-10-15 DIAGNOSIS — Z7901 Long term (current) use of anticoagulants: Secondary | ICD-10-CM | POA: Diagnosis not present

## 2016-10-15 DIAGNOSIS — R0602 Shortness of breath: Secondary | ICD-10-CM | POA: Diagnosis not present

## 2016-10-21 DIAGNOSIS — M19012 Primary osteoarthritis, left shoulder: Secondary | ICD-10-CM | POA: Diagnosis not present

## 2016-10-21 DIAGNOSIS — M25512 Pain in left shoulder: Secondary | ICD-10-CM | POA: Diagnosis not present

## 2016-10-21 DIAGNOSIS — M6281 Muscle weakness (generalized): Secondary | ICD-10-CM | POA: Diagnosis not present

## 2016-10-22 DIAGNOSIS — Z96612 Presence of left artificial shoulder joint: Secondary | ICD-10-CM | POA: Diagnosis not present

## 2016-10-28 DIAGNOSIS — M25512 Pain in left shoulder: Secondary | ICD-10-CM | POA: Diagnosis not present

## 2016-10-28 DIAGNOSIS — M6281 Muscle weakness (generalized): Secondary | ICD-10-CM | POA: Diagnosis not present

## 2016-10-28 DIAGNOSIS — M19012 Primary osteoarthritis, left shoulder: Secondary | ICD-10-CM | POA: Diagnosis not present

## 2016-11-11 DIAGNOSIS — M25512 Pain in left shoulder: Secondary | ICD-10-CM | POA: Diagnosis not present

## 2016-11-11 DIAGNOSIS — M19012 Primary osteoarthritis, left shoulder: Secondary | ICD-10-CM | POA: Diagnosis not present

## 2016-11-11 DIAGNOSIS — M6281 Muscle weakness (generalized): Secondary | ICD-10-CM | POA: Diagnosis not present

## 2016-11-18 DIAGNOSIS — M19012 Primary osteoarthritis, left shoulder: Secondary | ICD-10-CM | POA: Diagnosis not present

## 2016-11-18 DIAGNOSIS — I482 Chronic atrial fibrillation: Secondary | ICD-10-CM | POA: Diagnosis not present

## 2016-11-18 DIAGNOSIS — I110 Hypertensive heart disease with heart failure: Secondary | ICD-10-CM | POA: Diagnosis not present

## 2016-11-18 DIAGNOSIS — J449 Chronic obstructive pulmonary disease, unspecified: Secondary | ICD-10-CM | POA: Diagnosis not present

## 2016-11-18 DIAGNOSIS — M25512 Pain in left shoulder: Secondary | ICD-10-CM | POA: Diagnosis not present

## 2016-11-18 DIAGNOSIS — M6281 Muscle weakness (generalized): Secondary | ICD-10-CM | POA: Diagnosis not present

## 2016-11-18 DIAGNOSIS — Z7901 Long term (current) use of anticoagulants: Secondary | ICD-10-CM | POA: Diagnosis not present

## 2016-11-18 DIAGNOSIS — I5022 Chronic systolic (congestive) heart failure: Secondary | ICD-10-CM | POA: Diagnosis not present

## 2016-11-20 DIAGNOSIS — E782 Mixed hyperlipidemia: Secondary | ICD-10-CM | POA: Diagnosis not present

## 2016-11-20 DIAGNOSIS — I482 Chronic atrial fibrillation: Secondary | ICD-10-CM | POA: Diagnosis not present

## 2016-11-20 DIAGNOSIS — E119 Type 2 diabetes mellitus without complications: Secondary | ICD-10-CM | POA: Diagnosis not present

## 2016-11-20 DIAGNOSIS — I2510 Atherosclerotic heart disease of native coronary artery without angina pectoris: Secondary | ICD-10-CM | POA: Diagnosis not present

## 2016-11-20 DIAGNOSIS — I25119 Atherosclerotic heart disease of native coronary artery with unspecified angina pectoris: Secondary | ICD-10-CM | POA: Diagnosis not present

## 2016-12-27 DIAGNOSIS — R102 Pelvic and perineal pain: Secondary | ICD-10-CM | POA: Diagnosis not present

## 2016-12-27 DIAGNOSIS — M5441 Lumbago with sciatica, right side: Secondary | ICD-10-CM | POA: Diagnosis not present

## 2016-12-27 DIAGNOSIS — M545 Low back pain: Secondary | ICD-10-CM | POA: Diagnosis not present

## 2016-12-27 DIAGNOSIS — M5442 Lumbago with sciatica, left side: Secondary | ICD-10-CM | POA: Diagnosis not present

## 2016-12-27 DIAGNOSIS — M160 Bilateral primary osteoarthritis of hip: Secondary | ICD-10-CM | POA: Diagnosis not present

## 2016-12-27 DIAGNOSIS — M47816 Spondylosis without myelopathy or radiculopathy, lumbar region: Secondary | ICD-10-CM | POA: Diagnosis not present

## 2017-01-05 DIAGNOSIS — Z1212 Encounter for screening for malignant neoplasm of rectum: Secondary | ICD-10-CM | POA: Diagnosis not present

## 2017-01-05 DIAGNOSIS — Z1211 Encounter for screening for malignant neoplasm of colon: Secondary | ICD-10-CM | POA: Diagnosis not present

## 2017-01-08 DIAGNOSIS — M25551 Pain in right hip: Secondary | ICD-10-CM | POA: Diagnosis not present

## 2017-01-08 DIAGNOSIS — M25552 Pain in left hip: Secondary | ICD-10-CM | POA: Diagnosis not present

## 2017-01-08 DIAGNOSIS — M5136 Other intervertebral disc degeneration, lumbar region: Secondary | ICD-10-CM | POA: Diagnosis not present

## 2017-01-09 DIAGNOSIS — M545 Low back pain: Secondary | ICD-10-CM | POA: Diagnosis not present

## 2017-01-13 DIAGNOSIS — Z902 Acquired absence of lung [part of]: Secondary | ICD-10-CM | POA: Diagnosis not present

## 2017-01-13 DIAGNOSIS — C3492 Malignant neoplasm of unspecified part of left bronchus or lung: Secondary | ICD-10-CM | POA: Insufficient documentation

## 2017-01-13 HISTORY — DX: Malignant neoplasm of unspecified part of left bronchus or lung: C34.92

## 2017-01-14 DIAGNOSIS — M545 Low back pain: Secondary | ICD-10-CM | POA: Diagnosis not present

## 2017-01-20 DIAGNOSIS — M545 Low back pain: Secondary | ICD-10-CM | POA: Diagnosis not present

## 2017-01-26 DIAGNOSIS — Z5181 Encounter for therapeutic drug level monitoring: Secondary | ICD-10-CM | POA: Diagnosis not present

## 2017-01-26 DIAGNOSIS — R195 Other fecal abnormalities: Secondary | ICD-10-CM | POA: Diagnosis not present

## 2017-01-26 DIAGNOSIS — Z7901 Long term (current) use of anticoagulants: Secondary | ICD-10-CM | POA: Diagnosis not present

## 2017-02-12 DIAGNOSIS — Z0000 Encounter for general adult medical examination without abnormal findings: Secondary | ICD-10-CM | POA: Diagnosis not present

## 2017-02-12 DIAGNOSIS — E1129 Type 2 diabetes mellitus with other diabetic kidney complication: Secondary | ICD-10-CM | POA: Diagnosis not present

## 2017-02-12 DIAGNOSIS — Z1159 Encounter for screening for other viral diseases: Secondary | ICD-10-CM | POA: Diagnosis not present

## 2017-02-12 DIAGNOSIS — E039 Hypothyroidism, unspecified: Secondary | ICD-10-CM | POA: Diagnosis not present

## 2017-02-12 DIAGNOSIS — I482 Chronic atrial fibrillation: Secondary | ICD-10-CM | POA: Diagnosis not present

## 2017-02-12 DIAGNOSIS — J449 Chronic obstructive pulmonary disease, unspecified: Secondary | ICD-10-CM | POA: Diagnosis not present

## 2017-02-12 DIAGNOSIS — I2510 Atherosclerotic heart disease of native coronary artery without angina pectoris: Secondary | ICD-10-CM | POA: Diagnosis not present

## 2017-02-12 DIAGNOSIS — I110 Hypertensive heart disease with heart failure: Secondary | ICD-10-CM | POA: Diagnosis not present

## 2017-02-12 DIAGNOSIS — R809 Proteinuria, unspecified: Secondary | ICD-10-CM | POA: Diagnosis not present

## 2017-02-12 DIAGNOSIS — I5022 Chronic systolic (congestive) heart failure: Secondary | ICD-10-CM | POA: Diagnosis not present

## 2017-02-18 DIAGNOSIS — Z7901 Long term (current) use of anticoagulants: Secondary | ICD-10-CM | POA: Diagnosis not present

## 2017-02-19 DIAGNOSIS — Z8673 Personal history of transient ischemic attack (TIA), and cerebral infarction without residual deficits: Secondary | ICD-10-CM | POA: Diagnosis not present

## 2017-02-19 DIAGNOSIS — I10 Essential (primary) hypertension: Secondary | ICD-10-CM | POA: Diagnosis not present

## 2017-02-19 DIAGNOSIS — K5730 Diverticulosis of large intestine without perforation or abscess without bleeding: Secondary | ICD-10-CM | POA: Diagnosis not present

## 2017-02-19 DIAGNOSIS — C20 Malignant neoplasm of rectum: Secondary | ICD-10-CM | POA: Diagnosis not present

## 2017-02-19 DIAGNOSIS — R195 Other fecal abnormalities: Secondary | ICD-10-CM | POA: Diagnosis not present

## 2017-02-19 DIAGNOSIS — E119 Type 2 diabetes mellitus without complications: Secondary | ICD-10-CM | POA: Diagnosis not present

## 2017-02-19 DIAGNOSIS — Z955 Presence of coronary angioplasty implant and graft: Secondary | ICD-10-CM | POA: Diagnosis not present

## 2017-02-19 DIAGNOSIS — D128 Benign neoplasm of rectum: Secondary | ICD-10-CM | POA: Diagnosis not present

## 2017-02-25 DIAGNOSIS — Z7901 Long term (current) use of anticoagulants: Secondary | ICD-10-CM | POA: Diagnosis not present

## 2017-02-27 DIAGNOSIS — Z85048 Personal history of other malignant neoplasm of rectum, rectosigmoid junction, and anus: Secondary | ICD-10-CM | POA: Diagnosis not present

## 2017-02-27 DIAGNOSIS — Z7984 Long term (current) use of oral hypoglycemic drugs: Secondary | ICD-10-CM | POA: Diagnosis not present

## 2017-02-27 DIAGNOSIS — Z7901 Long term (current) use of anticoagulants: Secondary | ICD-10-CM | POA: Diagnosis not present

## 2017-02-27 DIAGNOSIS — I10 Essential (primary) hypertension: Secondary | ICD-10-CM | POA: Diagnosis not present

## 2017-02-27 DIAGNOSIS — E119 Type 2 diabetes mellitus without complications: Secondary | ICD-10-CM | POA: Diagnosis not present

## 2017-02-27 DIAGNOSIS — C801 Malignant (primary) neoplasm, unspecified: Secondary | ICD-10-CM | POA: Diagnosis not present

## 2017-02-27 DIAGNOSIS — Z85118 Personal history of other malignant neoplasm of bronchus and lung: Secondary | ICD-10-CM | POA: Diagnosis not present

## 2017-02-27 DIAGNOSIS — Z8673 Personal history of transient ischemic attack (TIA), and cerebral infarction without residual deficits: Secondary | ICD-10-CM | POA: Diagnosis not present

## 2017-02-27 DIAGNOSIS — Z79899 Other long term (current) drug therapy: Secondary | ICD-10-CM | POA: Diagnosis not present

## 2017-03-02 DIAGNOSIS — Z7901 Long term (current) use of anticoagulants: Secondary | ICD-10-CM | POA: Diagnosis not present

## 2017-03-03 DIAGNOSIS — K921 Melena: Secondary | ICD-10-CM | POA: Diagnosis not present

## 2017-03-10 DIAGNOSIS — K921 Melena: Secondary | ICD-10-CM | POA: Diagnosis not present

## 2017-03-13 DIAGNOSIS — Z5181 Encounter for therapeutic drug level monitoring: Secondary | ICD-10-CM | POA: Diagnosis not present

## 2017-03-13 DIAGNOSIS — Z7901 Long term (current) use of anticoagulants: Secondary | ICD-10-CM | POA: Diagnosis not present

## 2017-03-17 DIAGNOSIS — C20 Malignant neoplasm of rectum: Secondary | ICD-10-CM

## 2017-03-17 HISTORY — DX: Malignant neoplasm of rectum: C20

## 2017-03-30 ENCOUNTER — Other Ambulatory Visit: Payer: Self-pay | Admitting: Cardiology

## 2017-03-31 ENCOUNTER — Encounter: Payer: Self-pay | Admitting: Cardiology

## 2017-03-31 ENCOUNTER — Ambulatory Visit (INDEPENDENT_AMBULATORY_CARE_PROVIDER_SITE_OTHER): Payer: Medicare Other | Admitting: Cardiology

## 2017-03-31 VITALS — BP 120/66 | HR 64 | Resp 14 | Ht 72.0 in | Wt 301.0 lb

## 2017-03-31 DIAGNOSIS — E7800 Pure hypercholesterolemia, unspecified: Secondary | ICD-10-CM | POA: Diagnosis not present

## 2017-03-31 DIAGNOSIS — I25119 Atherosclerotic heart disease of native coronary artery with unspecified angina pectoris: Secondary | ICD-10-CM | POA: Diagnosis not present

## 2017-03-31 DIAGNOSIS — I482 Chronic atrial fibrillation, unspecified: Secondary | ICD-10-CM

## 2017-03-31 DIAGNOSIS — R0602 Shortness of breath: Secondary | ICD-10-CM | POA: Diagnosis not present

## 2017-03-31 DIAGNOSIS — I10 Essential (primary) hypertension: Secondary | ICD-10-CM

## 2017-03-31 DIAGNOSIS — C20 Malignant neoplasm of rectum: Secondary | ICD-10-CM | POA: Diagnosis not present

## 2017-03-31 NOTE — Progress Notes (Signed)
Cardiology Office Note:    Date:  03/31/2017   ID:  Travis Watts, DOB [birth year], MRN [id number]  PCP:  Cyndy Freeze, MD  Cardiologist:  [name], MD    Referring MD: No ref. provider found   Chief Complaint  Patient presents with  . Follow-up  I'm weak and tired  History of Present Illness:    Travis Watts is a 71 y.o. male  with history of coronary artery disease, chronic atrial fibrillation, history of lung cancer, recently diagnosed rectal cancer. He did have colonoscopy done 3 polyps removed 1 show adenocarcinoma. After the colectomy was considered however because of his comorbidity this plan has been abandoned. He is doing well but complained of being weak tired and exhausted having weak spells. When he got good day he can do things he can go on the tractor and works some button that days he had difficulty going to the restroom. Of course the biggest question is what part of this is related to his lung and lung resection and what part of this is related to his heart. I will ask him to have proBNP done today as well as echocardiogram to assess left ventricular ejection fraction tried to answer this question.  Past Medical History:  Diagnosis Date  . A-fib ([place])   . Cancer ([place])   . CHF (congestive heart failure) ([place])   . Coronary artery disease   . Diabetes mellitus without complication ([place])   . Hyperlipidemia   . Hypertension   . Stroke (cerebrum) [facility])     Past Surgical History:  Procedure Laterality Date  . ANKLE FUSION    . CARDIAC CATHETERIZATION    . LUNG LOBECTOMY    . REPLACEMENT TOTAL KNEE BILATERAL    . SKIN CANCER EXCISION      Current Medications: Current Meds  Medication Sig  . aspirin EC 81 MG tablet Take 81 mg by mouth daily.  . celecoxib (CELEBREX) 200 MG capsule Take 200 mg by mouth 2 (two) times daily.  . Cholecalciferol (VITAMIN D) 2000 units tablet Take 1 tablet by mouth daily.  . clopidogrel (PLAVIX) 75 MG tablet Take 75 mg  by mouth daily.  . digoxin (LANOXIN) 0.125 MG tablet Take 2 tablets by mouth daily.  . DULoxetine (CYMBALTA) 60 MG capsule Take 60 mg by mouth daily.  . furosemide (LASIX) 80 MG tablet Take 80 mg by mouth. 1 tablet 3 times a week  . gabapentin (NEURONTIN) 400 MG capsule Take 800 mg by mouth 3 (three) times daily.  Marland Kitchen glipiZIDE (GLUCOTROL) 10 MG tablet Take 10 mg by mouth 2 (two) times daily.  Marland Kitchen levothyroxine (SYNTHROID, LEVOTHROID) 50 MCG tablet Take 1 tablet by mouth daily.  Marland Kitchen lisinopril (PRINIVIL,ZESTRIL) 20 MG tablet Take 20 mg by mouth daily.  . magnesium oxide (MAG-OX) 400 MG tablet Take 400 mg by mouth daily.  . metFORMIN (GLUCOPHAGE) 1000 MG tablet Take 1,000 mg by mouth 2 (two) times daily.  . metoprolol succinate (TOPROL-XL) 25 MG 24 hr tablet Take 25 mg by mouth daily.  Marland Kitchen omeprazole (PRILOSEC) 20 MG capsule Take 20 mg by mouth 2 (two) times daily.  . QUEtiapine (SEROQUEL) 100 MG tablet Take 100 mg by mouth daily.  . solifenacin (VESICARE) 5 MG tablet Take 10 mg by mouth daily.  . tamsulosin (FLOMAX) 0.4 MG CAPS capsule Take 0.4 mg by mouth daily.  Marland Kitchen warfarin (COUMADIN) 5 MG tablet TAKE 1 TABLET BY MOUTH DAILY ALTERNATING WITH 1 & 1/2 TABLETS DAILY  or as directed     Allergies:   Patient has no known allergies.   Social History   Social History  . Marital status: Married    Spouse name: N/A  . Number of children: N/A  . Years of education: N/A   Social History Main Topics  . Smoking status: Former Research scientist (life sciences)  . Smokeless tobacco: Never Used  . Alcohol use No  . Drug use: No  . Sexual activity: Not Asked   Other Topics Concern  . None   Social History Narrative  . None     Family History: The patient's family history includes Depression in his mother; Early death in his brother, father, maternal grandmother, and maternal uncle; Heart attack in his brother, father, and paternal uncle; Heart disease in his brother and father; Heart failure in his brother and father;  Hyperlipidemia in his brother and father; Hypertension in his brother and father; Osteoarthritis in his father; Rheum arthritis in his father; Stroke in his maternal grandmother and maternal uncle. ROS:   Please see the history of present illness.    All 14 point review of systems negative except as described per history of present illness  EKGs/Labs/Other Studies Reviewed:      Recent Labs: No results found for requested labs within last 8760 hours.  Recent Lipid Panel No results found for: CHOL, TRIG, HDL, CHOLHDL, VLDL, LDLCALC, LDLDIRECT  Physical Exam:    VS:  BP 120/66   Pulse 64   Resp 14   Ht 6' (1.829 m)   Wt (!) 301 lb (136.5 kg)   BMI 40.82 kg/m     Wt Readings from Last 3 Encounters:  03/31/17 (!) 301 lb (136.5 kg)     GEN:  Well nourished, well developed in no acute distress HEENT: Normal NECK: No JVD; No carotid bruits LYMPHATICS: No lymphadenopathy CARDIAC: Irregularly irregular, no murmurs, no rubs, no gallops RESPIRATORY:  Clear to auscultation without rales, wheezing or rhonchi  ABDOMEN: Soft, non-tender, non-distended MUSCULOSKELETAL:  No edema; No deformity  SKIN: Warm and dry LOWER EXTREMITIES: no swelling NEUROLOGIC:  Alert and oriented x 3 PSYCHIATRIC:  Normal affect   ASSESSMENT:    1. Atherosclerosis of native coronary artery of native heart with angina pectoris ([place])   2. Chronic atrial fibrillation (HCC)   3. Essential hypertension   4. Hypercholesterolemia   5. Rectal cancer ([place])    PLAN:    In order of problems listed above:  1. Coronary artery disease: Asymptomatic recent cardiac catheterization done last year showing patent stent. 2. Chronic atrial fibrillation: Right appears to be controlled we'll continue present management. Will continue anticoagulation. 3. Essential hypertension: Blood pressure well controlled we'll continue present management. 4. Fatigue and shortness of breath echocardiogram will be done to assess left  ventricular ejection fraction, will check proBNP as well as Chem-7. 5. Dyslipidemia I will call primary care physician to get fasting lipid profile 6. Rectal cancer: Discussion as above   Medication Adjustments/Labs and Tests Ordered: Current medicines are reviewed at length with the patient today.  Concerns regarding medicines are outlined above.  No orders of the defined types were placed in this encounter.  Medication changes: No orders of the defined types were placed in this encounter.   Signed, Park Liter, MD, [facility] 03/31/2017 9:51 AM    [place]

## 2017-03-31 NOTE — Patient Instructions (Signed)
Medication Instructions:  Your physician recommends that you continue on your current medications as directed. Please refer to the Current Medication list given to you today.  1. Avoid all over-the-counter antihistamines except Claritin/Loratadine and Zyrtec/Cetrizine. 2. Avoid all combination including cold sinus allergies flu decongestant and sleep medications 3. You can use Robitussin DM Mucinex and Mucinex DM for cough. 4. can use Tylenol aspirin ibuprofen and naproxen but no combinations such as sleep or sinus.  Labwork: Your physician recommends that you have lab work today to check your fluid congestion level and Kidney function as well as sodium level and potassium.   Testing/Procedures: Your physician has requested that you have an echocardiogram. Echocardiography is a painless test that uses sound waves to create images of your heart. It provides your doctor with information about the size and shape of your heart and how well your heart's chambers and valves are working. This procedure takes approximately one hour. There are no restrictions for this procedure.  Follow-Up: Your physician recommends that you schedule a follow-up appointment in: 1 month   Any Other Special Instructions Will Be Listed Below (If Applicable).  Please note that any paperwork needing to be filled out by the provider will need to be addressed at the front desk prior to seeing the provider. Please note that any paperwork FMLA, Disability or other documents regarding health condition is subject to a $25.00 charge that must be received prior to completion of paperwork in the form of a money order or check.    If you need a refill on your cardiac medications before your next appointment, please call your pharmacy.

## 2017-04-01 LAB — BASIC METABOLIC PANEL
BUN / CREAT RATIO: 9 — AB (ref 10–24)
BUN: 10 mg/dL (ref 8–27)
CO2: 22 mmol/L (ref 20–29)
CREATININE: 1.12 mg/dL (ref 0.76–1.27)
Calcium: 8.8 mg/dL (ref 8.6–10.2)
Chloride: 98 mmol/L (ref 96–106)
GFR, EST AFRICAN AMERICAN: 77 mL/min/{1.73_m2} (ref >59)
GFR, EST NON AFRICAN AMERICAN: 66 mL/min/{1.73_m2} (ref >59)
GLUCOSE: 216 mg/dL — AB (ref 65–99)
Potassium: 4.1 mmol/L (ref 3.5–5.2)
Sodium: 140 mmol/L (ref 134–144)

## 2017-04-01 LAB — PRO B NATRIURETIC PEPTIDE: NT-Pro BNP: 383 pg/mL — ABNORMAL HIGH (ref 0–376)

## 2017-04-02 ENCOUNTER — Other Ambulatory Visit: Payer: Self-pay | Admitting: Cardiology

## 2017-04-07 DIAGNOSIS — Z23 Encounter for immunization: Secondary | ICD-10-CM | POA: Diagnosis not present

## 2017-04-13 ENCOUNTER — Other Ambulatory Visit: Payer: Self-pay | Admitting: Cardiology

## 2017-04-23 ENCOUNTER — Ambulatory Visit (HOSPITAL_BASED_OUTPATIENT_CLINIC_OR_DEPARTMENT_OTHER)
Admission: RE | Admit: 2017-04-23 | Discharge: 2017-04-23 | Disposition: A | Discharge status: Home / Self Care | Payer: Medicare Other | Source: Ambulatory Visit | Attending: Cardiology | Admitting: Cardiology

## 2017-04-23 DIAGNOSIS — I482 Chronic atrial fibrillation, unspecified: Secondary | ICD-10-CM

## 2017-04-23 DIAGNOSIS — I10 Essential (primary) hypertension: Secondary | ICD-10-CM | POA: Diagnosis not present

## 2017-04-23 DIAGNOSIS — I509 Heart failure, unspecified: Secondary | ICD-10-CM | POA: Insufficient documentation

## 2017-04-23 DIAGNOSIS — I2510 Atherosclerotic heart disease of native coronary artery without angina pectoris: Secondary | ICD-10-CM | POA: Diagnosis not present

## 2017-04-23 DIAGNOSIS — I110 Hypertensive heart disease with heart failure: Secondary | ICD-10-CM | POA: Insufficient documentation

## 2017-04-23 DIAGNOSIS — E119 Type 2 diabetes mellitus without complications: Secondary | ICD-10-CM | POA: Diagnosis not present

## 2017-04-23 DIAGNOSIS — E669 Obesity, unspecified: Secondary | ICD-10-CM | POA: Insufficient documentation

## 2017-04-23 MED ORDER — PERFLUTREN LIPID MICROSPHERE
1.0000 mL | INTRAVENOUS | Status: AC | PRN
  Administered 2017-04-23: 2 mL via INTRAVENOUS
  Filled 2017-04-23: qty 10

## 2017-04-23 NOTE — Progress Notes (Signed)
  Echocardiogram 2D Echocardiogram with Definity has been performed.  Tresa Res 04/23/2017, 4:10 PM

## 2017-04-27 MED FILL — Perflutren Lipid Microsphere IV Susp 1.1 MG/ML: INTRAVENOUS | Qty: 10 | Status: AC

## 2017-04-28 ENCOUNTER — Ambulatory Visit (INDEPENDENT_AMBULATORY_CARE_PROVIDER_SITE_OTHER): Payer: Medicare Other | Admitting: Cardiology

## 2017-04-28 ENCOUNTER — Encounter: Payer: Self-pay | Admitting: Cardiology

## 2017-04-28 VITALS — BP 138/82 | HR 71 | Resp 16 | Ht 72.0 in | Wt 281.1 lb

## 2017-04-28 DIAGNOSIS — I10 Essential (primary) hypertension: Secondary | ICD-10-CM | POA: Diagnosis not present

## 2017-04-28 DIAGNOSIS — C20 Malignant neoplasm of rectum: Secondary | ICD-10-CM | POA: Diagnosis not present

## 2017-04-28 DIAGNOSIS — I25119 Atherosclerotic heart disease of native coronary artery with unspecified angina pectoris: Secondary | ICD-10-CM

## 2017-04-28 DIAGNOSIS — I482 Chronic atrial fibrillation, unspecified: Secondary | ICD-10-CM

## 2017-04-28 DIAGNOSIS — C3492 Malignant neoplasm of unspecified part of left bronchus or lung: Secondary | ICD-10-CM | POA: Diagnosis not present

## 2017-04-28 DIAGNOSIS — G4733 Obstructive sleep apnea (adult) (pediatric): Secondary | ICD-10-CM

## 2017-04-28 NOTE — Progress Notes (Signed)
Cardiology Office Note:    Date:  04/28/2017   ID:  Travis Watts, DOB 1945-08-15, MRN [id number]  PCP:  Travis Freeze, MD  Cardiologist:  Travis Campus, MD    Referring MD: Travis Freeze, MD   Chief Complaint  Patient presents with  . Follow-up    discuss echo results; no changes in the way he feels   Feeling weak and tired  History of Present Illness:    Travis Watts is a 71 y.o. male with multiple medical problems.  He does complain of fatigue tiredness and shortness of breath.  I did echocardiogram to check left ventricular ejection fraction is ejection fraction was normal.  I did proBNP which was minimally elevated asking to increase dose of furosemide just for a few days but he cannot tell me if he feels any better.  Therefore we went back to the previous dose of medications and will continue that.  I think significant portion of his problem is some depression.  He always likely be depressed and he did have posttraumatic stress disorder previously managed by psychotherapist and psychiatrist.  We talked at length about this issue and I strongly recommended for him to go and see somebody for it.  He will try to go and see his psychotherapist that he seen before.  His wife was with him in the room and she is thinking the same way.  Past Medical History:  Diagnosis Date  . A-fib ([place])   . Cancer ([place])   . CHF (congestive heart failure) ([place])   . Coronary artery disease   . Diabetes mellitus without complication ([place])   . Hyperlipidemia   . Hypertension   . Stroke (cerebrum) [facility])     Past Surgical History:  Procedure Laterality Date  . ANKLE FUSION    . CARDIAC CATHETERIZATION    . LUNG LOBECTOMY    . REPLACEMENT TOTAL KNEE BILATERAL    . SKIN CANCER EXCISION      Current Medications: Current Meds  Medication Sig  . aspirin EC 81 MG tablet Take 81 mg by mouth daily.  . celecoxib (CELEBREX) 200 MG capsule Take 200 mg by mouth 2 (two) times daily.  .  Cholecalciferol (VITAMIN D) 2000 units tablet Take 1 tablet by mouth daily.  . clopidogrel (PLAVIX) 75 MG tablet Take 75 mg by mouth daily.  . digoxin (LANOXIN) 0.125 MG tablet Take 2 tablets by mouth daily.  . DULoxetine (CYMBALTA) 60 MG capsule Take 60 mg by mouth daily.  . furosemide (LASIX) 80 MG tablet Take 80 mg by mouth. 1 tablet 3 times a week  . gabapentin (NEURONTIN) 400 MG capsule Take 800 mg by mouth 3 (three) times daily.  Marland Kitchen glipiZIDE (GLUCOTROL) 10 MG tablet Take 10 mg by mouth 2 (two) times daily.  Marland Kitchen lisinopril (PRINIVIL,ZESTRIL) 20 MG tablet Take 20 mg by mouth daily.  . magnesium oxide (MAG-OX) 400 MG tablet Take 400 mg by mouth daily.  . metFORMIN (GLUCOPHAGE) 1000 MG tablet Take 1,000 mg by mouth 2 (two) times daily.  . metoprolol succinate (TOPROL-XL) 25 MG 24 hr tablet Take 25 mg by mouth daily.  Marland Kitchen omeprazole (PRILOSEC) 20 MG capsule Take 20 mg by mouth 2 (two) times daily.  . QUEtiapine (SEROQUEL) 100 MG tablet Take 100 mg by mouth daily.  . solifenacin (VESICARE) 5 MG tablet Take 10 mg by mouth daily.  . tamsulosin (FLOMAX) 0.4 MG CAPS capsule Take 0.4 mg by mouth daily.  Marland Kitchen warfarin (COUMADIN) 5  MG tablet TAKE 1 TABLET BY MOUTH DAILY ALTERNATING WITH 1 & 1/2 TABLETS DAILY or as directed     Allergies:   Patient has no known allergies.   Social History   Socioeconomic History  . Marital status: Married    Spouse name: None  . Number of children: None  . Years of education: None  . Highest education level: None  Social Needs  . Financial resource strain: None  . Food insecurity - worry: None  . Food insecurity - inability: None  . Transportation needs - medical: None  . Transportation needs - non-medical: None  Occupational History  . None  Tobacco Use  . Smoking status: Former Research scientist (life sciences)  . Smokeless tobacco: Never Used  Substance and Sexual Activity  . Alcohol use: No  . Drug use: No  . Sexual activity: None  Other Topics Concern  . None  Social History  Narrative  . None     Family History: The patient's family history includes Depression in his mother; Early death in his brother, father, maternal grandmother, and maternal uncle; Heart attack in his brother, father, and paternal uncle; Heart disease in his brother and father; Heart failure in his brother and father; Hyperlipidemia in his brother and father; Hypertension in his brother and father; Osteoarthritis in his father; Rheum arthritis in his father; Stroke in his maternal grandmother and maternal uncle. ROS:   Please see the history of present illness.    All 14 point review of systems negative except as described per history of present illness  EKGs/Labs/Other Studies Reviewed:      Recent Labs: 03/31/2017: BUN 10; Creatinine, Ser 1.12; NT-Pro BNP 383; Potassium 4.1; Sodium 140  Recent Lipid Panel No results found for: CHOL, TRIG, HDL, CHOLHDL, VLDL, LDLCALC, LDLDIRECT  Physical Exam:    VS:  BP 138/82 (BP Location: Left Arm, Patient Position: Sitting, Cuff Size: Large)   Pulse 71   Resp 16   Ht 6' (1.829 m)   Wt 281 lb 1.3 oz (127.5 kg)   BMI 38.12 kg/m     Wt Readings from Last 3 Encounters:  04/28/17 281 lb 1.3 oz (127.5 kg)  03/31/17 (!) 301 lb (136.5 kg)     GEN:  Well nourished, well developed in no acute distress HEENT: Normal NECK: No JVD; No carotid bruits LYMPHATICS: No lymphadenopathy CARDIAC: Irregularly irregular, no murmurs, no rubs, no gallops RESPIRATORY:  Clear to auscultation without rales, wheezing or rhonchi  ABDOMEN: Soft, non-tender, non-distended MUSCULOSKELETAL:  No edema; No deformity  SKIN: Warm and dry LOWER EXTREMITIES: no swelling NEUROLOGIC:  Alert and oriented x 3 PSYCHIATRIC:  Normal affect   ASSESSMENT:    1. Atherosclerosis of native coronary artery of native heart with angina pectoris ([place])   2. Chronic atrial fibrillation (HCC)   3. Essential hypertension   4. OSA (obstructive sleep apnea)   5. Primary malignant neoplasm  of left lung ([place])   6. Rectal cancer ([place])    PLAN:    In order of problems listed above:  1. Coronary artery disease: Asymptomatic continue present management 2. Chronic atrial fibrillation will continue with Coumadin, continue with AV blocking agents her rate is controlled. 3. Essential hypertension: Blood pressure well controlled continue present management. 4. Obstructive sleep apnea: By primary care physician. 5. Lung and rectal cancer: Managed by oncology team.   Medication Adjustments/Labs and Tests Ordered: Current medicines are reviewed at length with the patient today.  Concerns regarding medicines are outlined above.  No  orders of the defined types were placed in this encounter.  Medication changes: No orders of the defined types were placed in this encounter.   Signed, Park Liter, MD, [facility] 04/28/2017 9:27 AM    [place]

## 2017-04-28 NOTE — Patient Instructions (Addendum)
Medication Instructions:  Your physician recommends that you continue on your current medications as directed. Please refer to the Current Medication list given to you today.  Labwork: None   Testing/Procedures: Your physician has requested that you have a carotid duplex. This test is an ultrasound of the carotid arteries in your neck. It looks at blood flow through these arteries that supply the brain with blood. Allow one hour for this exam. There are no restrictions or special instructions.  Follow-Up: Your physician recommends that you schedule a follow-up appointment in: 3 months   Any Other Special Instructions Will Be Listed Below (If Applicable).  Please note that any paperwork needing to be filled out by the provider will need to be addressed at the front desk prior to seeing the provider. Please note that any paperwork FMLA, Disability or other documents regarding health condition is subject to a $25.00 charge that must be received prior to completion of paperwork in the form of a money order or check.    If you need a refill on your cardiac medications before your next appointment, please call your pharmacy.

## 2017-05-12 DIAGNOSIS — M5416 Radiculopathy, lumbar region: Secondary | ICD-10-CM | POA: Diagnosis not present

## 2017-05-20 ENCOUNTER — Ambulatory Visit (HOSPITAL_BASED_OUTPATIENT_CLINIC_OR_DEPARTMENT_OTHER)
Admission: RE | Admit: 2017-05-20 | Discharge: 2017-05-20 | Disposition: A | Discharge status: Home / Self Care | Payer: Medicare Other | Source: Ambulatory Visit | Attending: Cardiology | Admitting: Cardiology

## 2017-05-20 DIAGNOSIS — I10 Essential (primary) hypertension: Secondary | ICD-10-CM | POA: Insufficient documentation

## 2017-05-20 DIAGNOSIS — Z87891 Personal history of nicotine dependence: Secondary | ICD-10-CM | POA: Diagnosis not present

## 2017-05-20 DIAGNOSIS — E785 Hyperlipidemia, unspecified: Secondary | ICD-10-CM | POA: Insufficient documentation

## 2017-05-20 DIAGNOSIS — I25119 Atherosclerotic heart disease of native coronary artery with unspecified angina pectoris: Secondary | ICD-10-CM

## 2017-05-20 LAB — VAS US CAROTID
LCCADSYS: 77 cm/s
LCCAPDIAS: 17 cm/s
LEFT ECA DIAS: -16 cm/s
LEFT VERTEBRAL DIAS: -8 cm/s
LICADDIAS: 17 cm/s
LICADSYS: 74 cm/s
Left CCA dist dias: 16 cm/s
Left CCA prox sys: 89 cm/s
Left ICA prox dias: 13 cm/s
Left ICA prox sys: 40 cm/s
RCCADSYS: -62 cm/s
RCCAPSYS: 79 cm/s
RIGHT ECA DIAS: -19 cm/s
RIGHT VERTEBRAL DIAS: 7 cm/s
Right CCA prox dias: 18 cm/s

## 2017-05-20 NOTE — Progress Notes (Signed)
Tech completed carotid duplex, No significant ICA stenosis identified, intimal thickening is noted in the CCA bilaterally. Travis Watts

## 2017-05-22 DIAGNOSIS — M5416 Radiculopathy, lumbar region: Secondary | ICD-10-CM | POA: Diagnosis not present

## 2017-05-22 DIAGNOSIS — M545 Low back pain: Secondary | ICD-10-CM | POA: Diagnosis not present

## 2017-05-22 DIAGNOSIS — M48061 Spinal stenosis, lumbar region without neurogenic claudication: Secondary | ICD-10-CM | POA: Diagnosis not present

## 2017-06-03 DIAGNOSIS — M4316 Spondylolisthesis, lumbar region: Secondary | ICD-10-CM | POA: Diagnosis not present

## 2017-06-03 DIAGNOSIS — M48061 Spinal stenosis, lumbar region without neurogenic claudication: Secondary | ICD-10-CM | POA: Diagnosis present

## 2017-06-03 DIAGNOSIS — M48062 Spinal stenosis, lumbar region with neurogenic claudication: Secondary | ICD-10-CM | POA: Diagnosis not present

## 2017-06-03 DIAGNOSIS — I10 Essential (primary) hypertension: Secondary | ICD-10-CM | POA: Diagnosis not present

## 2017-06-03 HISTORY — DX: Spinal stenosis, lumbar region without neurogenic claudication: M48.061

## 2017-06-04 ENCOUNTER — Other Ambulatory Visit: Payer: Self-pay | Admitting: Neurosurgery

## 2017-06-17 ENCOUNTER — Encounter: Payer: Self-pay | Admitting: Cardiology

## 2017-06-17 ENCOUNTER — Ambulatory Visit (INDEPENDENT_AMBULATORY_CARE_PROVIDER_SITE_OTHER): Payer: Medicare Other | Admitting: Cardiology

## 2017-06-17 VITALS — BP 110/64 | HR 52 | Ht 72.0 in | Wt 303.8 lb

## 2017-06-17 DIAGNOSIS — I482 Chronic atrial fibrillation, unspecified: Secondary | ICD-10-CM

## 2017-06-17 DIAGNOSIS — G4733 Obstructive sleep apnea (adult) (pediatric): Secondary | ICD-10-CM

## 2017-06-17 DIAGNOSIS — I10 Essential (primary) hypertension: Secondary | ICD-10-CM

## 2017-06-17 DIAGNOSIS — Z01818 Encounter for other preprocedural examination: Secondary | ICD-10-CM | POA: Insufficient documentation

## 2017-06-17 DIAGNOSIS — C20 Malignant neoplasm of rectum: Secondary | ICD-10-CM | POA: Diagnosis not present

## 2017-06-17 DIAGNOSIS — I25119 Atherosclerotic heart disease of native coronary artery with unspecified angina pectoris: Secondary | ICD-10-CM

## 2017-06-17 HISTORY — DX: Encounter for other preprocedural examination: Z01.818

## 2017-06-17 NOTE — Progress Notes (Signed)
Cardiology Office Note:    Date:  06/17/2017   ID:  Travis Watts [place], DOB 1946/06/08, MRN [id number]  PCP:  Travis Freeze, MD  Cardiologist:  Travis Campus, MD    Referring MD: Travis Freeze, MD   Chief Complaint  Patient presents with  . Pre-op Exam  I am doing well  History of Present Illness:    Travis Watts is a 71 y.o. male with multiple medical problems.  Apparently he got chronic back pain and conversation regarding surgery have been initiated.  He was referred to me for evaluation before the surgery.  Review his records carefully and he does have multiple issues.  Last evaluation of his coronary artery status was done in December 2017 when he had cardiac catheterization.  Cardiac catheterization revealed 60% mid LAD, multiple levels 40% RCA.  Circumflex was normal.  Recently he had echocardiogram which showed preserved left ventricular ejection fraction.  Also recent evaluation of his carotid artery did not show any critical lesions.  His ability to exercise is limited because of chronic lung condition and shortness of breath.  He denies have any chest pain tightness squeezing pressure burning chest but he is diabetic for long time.  Overall I told him that to fully assess his risk before surgery he need to have a stress test.  He was diagnosed with rectal cancer and he was disqualified as a candidate for surgery because of comorbidities.  However, back surgery carries slightly low risk for an open abdominal surgery.  He told me straight that he cannot live with this amount of pain that he has and he wants to have his back surgery.  Past Medical History:  Diagnosis Date  . A-fib (Travis Watts)   . Cancer (Travis Watts)   . CHF (congestive heart failure) (Travis Watts)   . Coronary artery disease   . Diabetes mellitus without complication (Travis Watts)   . Hyperlipidemia   . Hypertension   . Stroke (cerebrum) Travis Watts)     Past Surgical History:  Procedure Laterality Date  . ANKLE FUSION    . CARDIAC  CATHETERIZATION    . LUNG LOBECTOMY    . REPLACEMENT TOTAL KNEE BILATERAL    . SKIN CANCER EXCISION      Current Medications: Current Meds  Medication Sig  . aspirin EC 81 MG tablet Take 81 mg by mouth daily.  Marland Kitchen atorvastatin (LIPITOR) 40 MG tablet Take 20 mg by mouth daily.  . celecoxib (CELEBREX) 200 MG capsule Take 200 mg by mouth 2 (two) times daily.  . Cholecalciferol (VITAMIN D) 2000 units tablet Take 2,000 Units by mouth daily.   . clopidogrel (PLAVIX) 75 MG tablet Take 75 mg by mouth daily.  . digoxin (LANOXIN) 0.125 MG tablet Take 0.125 mg by mouth at bedtime.   . DULoxetine (CYMBALTA) 60 MG capsule Take 60 mg by mouth daily.  . furosemide (LASIX) 80 MG tablet Take 80 mg by mouth 3 (three) times a week.   . gabapentin (NEURONTIN) 400 MG capsule Take 400-800 mg by mouth See admin instructions. Take 400 mg by mouth in the morning and take 800 mg by mouth at bedtime  . glipiZIDE (GLUCOTROL) 10 MG tablet Take 10 mg by mouth 2 (two) times daily.  Marland Kitchen HYDROcodone-acetaminophen (NORCO/VICODIN) 5-325 MG tablet Take 1 tablet by mouth 2 (two) times daily as needed for moderate pain.   Marland Kitchen lisinopril (PRINIVIL,ZESTRIL) 20 MG tablet Take 20 mg by mouth daily.  . magnesium oxide (MAG-OX) 400 MG tablet Take 400 mg  by mouth daily.  . metFORMIN (GLUCOPHAGE) 1000 MG tablet Take 1,000 mg by mouth 2 (two) times daily.  . metoprolol succinate (TOPROL-XL) 25 MG 24 hr tablet Take 25 mg by mouth daily.  Marland Kitchen omeprazole (PRILOSEC) 20 MG capsule Take 20 mg by mouth 2 (two) times daily.  . QUEtiapine (SEROQUEL) 100 MG tablet Take 150 mg by mouth at bedtime.   . solifenacin (VESICARE) 5 MG tablet Take 5 mg by mouth daily.   . tamsulosin (FLOMAX) 0.4 MG CAPS capsule Take 0.4 mg by mouth at bedtime.   Marland Kitchen warfarin (COUMADIN) 5 MG tablet Take 7.5 mg by mouth daily     Allergies:   Patient has no known allergies.   Social History   Socioeconomic History  . Marital status: Married    Spouse name: None  . Number  of children: None  . Years of education: None  . Highest education level: None  Social Needs  . Financial resource strain: None  . Food insecurity - worry: None  . Food insecurity - inability: None  . Transportation needs - medical: None  . Transportation needs - non-medical: None  Occupational History  . None  Tobacco Use  . Smoking status: Former Research scientist (life sciences)  . Smokeless tobacco: Never Used  Substance and Sexual Activity  . Alcohol use: No  . Drug use: No  . Sexual activity: None  Other Topics Concern  . None  Social History Narrative  . None     Family History: The patient's family history includes Depression in his mother; Early death in his brother, father, maternal grandmother, and maternal uncle; Heart attack in his brother, father, and paternal uncle; Heart disease in his brother and father; Heart failure in his brother and father; Hyperlipidemia in his brother and father; Hypertension in his brother and father; Osteoarthritis in his father; Rheum arthritis in his father; Stroke in his maternal grandmother and maternal uncle. ROS:   Please see the history of present illness.    All 14 point review of systems negative except as described per history of present illness  EKGs/Labs/Other Studies Reviewed:      Recent Labs: 03/31/2017: BUN 10; Creatinine, Ser 1.12; NT-Pro BNP 383; Potassium 4.1; Sodium 140  Recent Lipid Panel No results found for: CHOL, TRIG, HDL, CHOLHDL, VLDL, LDLCALC, LDLDIRECT  Physical Exam:    VS:  BP 110/64   Pulse (!) 52   Ht 6' (1.829 m)   Wt (!) 303 lb 12.8 oz (137.8 kg)   SpO2 96%   BMI 41.20 kg/m     Wt Readings from Last 3 Encounters:  06/17/17 (!) 303 lb 12.8 oz (137.8 kg)  04/28/17 281 lb 1.3 oz (127.5 kg)  03/31/17 (!) 301 lb (136.5 kg)     GEN:  Well nourished, well developed in no acute distress HEENT: Normal NECK: No JVD; No carotid bruits LYMPHATICS: No lymphadenopathy CARDIAC: Irregularly irregular, no murmurs, no rubs, no  gallops RESPIRATORY:  Clear to auscultation without rales, wheezing or rhonchi  ABDOMEN: Soft, non-tender, non-distended MUSCULOSKELETAL:  No edema; No deformity  SKIN: Warm and dry LOWER EXTREMITIES: no swelling NEUROLOGIC:  Alert and oriented x 3 PSYCHIATRIC:  Normal affect   ASSESSMENT:    1. Atherosclerosis of native coronary artery of native heart with angina pectoris ([place])   2. Chronic atrial fibrillation (H[facility])   3. Essential hypertension   4. OSA (obstructive sleep apnea)   5. Rectal cancer ([place])    PLAN:    In order of  problems listed above:  1. Coronary artery disease: Cardiac catheterization December of last year showing 60% LAD, multiple 40% lesions of RCA, normal circumflex.  In my opinion before proceeding with surgery he need to have a stress test to evaluate and assess his heart.  Overall in my opinion surgery car significant risk from multiple comorbidities that include his respiratory status, advanced COPD, status post lung surgery: Lung cancer.  He understands that issue and he still willing to proceed. 2. Chronic atrial fibrillation: He is anticoagulated which I will continue.  He is also on aspirin and Plavix.  I will ask him to discontinue his aspirin, in terms of anticoagulation he needs to be bridged with Lovenox around surgical time. 3. Essential hypertension: Blood pressure well controlled continue present management 4. Structure sleep apnea: Managed by internal medicine team. 5. Rectal cancer: He will be scheduled to have colonoscopy again.   Medication Adjustments/Labs and Tests Ordered: Current medicines are reviewed at length with the patient today.  Concerns regarding medicines are outlined above.  No orders of the defined types were placed in this encounter.  Medication changes: No orders of the defined types were placed in this encounter.   Signed, Park Liter, MD, [facility] 06/17/2017 8:26 AM    [place]

## 2017-06-17 NOTE — Patient Instructions (Addendum)
Medication Instructions:  Your physician recommends that you continue on your current medications as directed. Please refer to the Current Medication list given to you today.  Labwork: None  Testing/Procedures: Your physician has requested that you have a dobutamine myoview. For furth information please visit [email or web address]. Please follow instruction sheet, as given.   Follow-Up: Your physician recommends that you schedule a follow-up appointment in: 2 months  Any Other Special Instructions Will Be Listed Below (If Applicable).     If you need a refill on your cardiac medications before your next appointment, please call your pharmacy.   [place], RN, BSN

## 2017-06-17 NOTE — Pre-Procedure Instructions (Signed)
Travis Watts  06/17/2017      [place], [place] [address] Phone: [phone] Fax: [phone]    Your procedure is scheduled on Thursday January 3.  Report to [facility] Admitting at 8:15 A.M.  Call this number if you have problems the morning of surgery:  [phone]   Remember:  Do not eat food or drink liquids after midnight.  Take these medicines the morning of surgery with A SIP OF WATER:   Metoprolol (toprol-XL) Omeprazole (prilosec) Duloxetine (cymbalta) Gabapentin (neurontin) Solifenacin (vesicare) Hydrocodone (Norco) if needed  DO NOT TAKE metformin (glucophage) or glipizide (Glucotrol) the day of surgery.   DO NOT TAKE glipizide (glucotrol) the night before sugery.   7 days prior to surgery STOP taking any celebrex (celecoxib), Aleve, Naproxen, Ibuprofen, Motrin, Advil, Goody's, BC's, all herbal medications, fish oil, and all vitamins  **FOLLOW Surgeon's instructions on stopping Aspirin, Plavix (clopidogrel), and warfarin (coumadin). If no instructions were given, please call surgeons office. **      How to Manage Your Diabetes Before and After Surgery  Why is it important to control my blood sugar before and after surgery? . Improving blood sugar levels before and after surgery helps healing and can limit problems. . A way of improving blood sugar control is eating a healthy diet by: o  Eating less sugar and carbohydrates o  Increasing activity/exercise o  Talking with your doctor about reaching your blood sugar goals . High blood sugars (greater than 180 mg/dL) can raise your risk of infections and slow your recovery, so you will need to focus on controlling your diabetes during the weeks before surgery. . Make sure that the doctor who takes care of your diabetes knows about your planned surgery including the date and location.  How do I manage my blood  sugar before surgery? . Check your blood sugar at least 4 times a day, starting 2 days before surgery, to make sure that the level is not too high or low. o Check your blood sugar the morning of your surgery when you wake up and every 2 hours until you get to the Short Stay unit. . If your blood sugar is less than 70 mg/dL, you will need to treat for low blood sugar: o Do not take insulin. o Treat a low blood sugar (less than 70 mg/dL) with  cup of clear juice (cranberry or apple), 4 glucose tablets, OR glucose gel. Recheck blood sugar in 15 minutes after treatment (to make sure it is greater than 70 mg/dL). If your blood sugar is not greater than 70 mg/dL on recheck, call [phone] o  for further instructions. . Report your blood sugar to the short stay nurse when you get to Short Stay.  . If you are admitted to the hospital after surgery: o Your blood sugar will be checked by the staff and you will probably be given insulin after surgery (instead of oral diabetes medicines) to make sure you have good blood sugar levels. o The goal for blood sugar control after surgery is 80-180 mg/dL.              Do not wear jewelry  Do not wear lotions, powders, or colognes, or deodorant.  Do not shave 48 hours prior to surgery.  Men may shave face and neck.  Do not bring valuables to the hospital.  [facility] is  not responsible for any belongings or valuables.  Contacts, dentures or bridgework may not be worn into surgery.  Leave your suitcase in the car.  After surgery it may be brought to your room.  For patients admitted to the hospital, discharge time will be determined by your treatment team.  Patients discharged the day of surgery will not be allowed to drive home.    Special instructions:    [place]- Preparing For Surgery  Before surgery, you can play an important role. Because skin is not sterile, your skin needs to be as free of germs as possible. You can reduce the  number of germs on your skin by washing with CHG (chlorahexidine gluconate) Soap before surgery.  CHG is an antiseptic cleaner which kills germs and bonds with the skin to continue killing germs even after washing.  Please do not use if you have an allergy to CHG or antibacterial soaps. If your skin becomes reddened/irritated stop using the CHG.  Do not shave (including legs and underarms) for at least 48 hours prior to first CHG shower. It is OK to shave your face.  Please follow these instructions carefully.   1. Shower the NIGHT BEFORE SURGERY and the MORNING OF SURGERY with CHG.   2. If you chose to wash your hair, wash your hair first as usual with your normal shampoo.  3. After you shampoo, rinse your hair and body thoroughly to remove the shampoo.  4. Use CHG as you would any other liquid soap. You can apply CHG directly to the skin and wash gently with a scrungie or a clean washcloth.   5. Apply the CHG Soap to your body ONLY FROM THE NECK DOWN.  Do not use on open wounds or open sores. Avoid contact with your eyes, ears, mouth and genitals (private parts). Wash Face and genitals (private parts)  with your normal soap.  6. Wash thoroughly, paying special attention to the area where your surgery will be performed.  7. Thoroughly rinse your body with warm water from the neck down.  8. DO NOT shower/wash with your normal soap after using and rinsing off the CHG Soap.  9. Pat yourself dry with a CLEAN TOWEL.  10. Wear CLEAN PAJAMAS to bed the night before surgery, wear comfortable clothes the morning of surgery  11. Place CLEAN SHEETS on your bed the night of your first shower and DO NOT SLEEP WITH PETS.    Day of Surgery: Do not apply any deodorants/lotions. Please wear clean clothes to the hospital/surgery center.      Please read over the following fact sheets that you were given. Coughing and Deep Breathing, MRSA Information and Surgical Site Infection  Prevention

## 2017-06-18 ENCOUNTER — Other Ambulatory Visit: Payer: Self-pay

## 2017-06-18 ENCOUNTER — Encounter (HOSPITAL_COMMUNITY): Payer: Self-pay

## 2017-06-18 ENCOUNTER — Inpatient Hospital Stay (HOSPITAL_COMMUNITY)
Admission: RE | Admit: 2017-06-18 | Discharge: 2017-06-18 | Disposition: A | Discharge status: Home / Self Care | Payer: Medicare Other | Source: Ambulatory Visit

## 2017-06-18 DIAGNOSIS — Z01818 Encounter for other preprocedural examination: Secondary | ICD-10-CM | POA: Diagnosis not present

## 2017-06-18 DIAGNOSIS — Z01810 Encounter for preprocedural cardiovascular examination: Secondary | ICD-10-CM | POA: Diagnosis not present

## 2017-06-18 DIAGNOSIS — I25119 Atherosclerotic heart disease of native coronary artery with unspecified angina pectoris: Secondary | ICD-10-CM | POA: Diagnosis not present

## 2017-06-18 HISTORY — DX: Anxiety disorder, unspecified: F41.9

## 2017-06-18 HISTORY — DX: Depression, unspecified: F32.A

## 2017-06-18 HISTORY — DX: Cardiac arrhythmia, unspecified: I49.9

## 2017-06-18 HISTORY — DX: Gastro-esophageal reflux disease without esophagitis: K21.9

## 2017-06-18 HISTORY — DX: Major depressive disorder, single episode, unspecified: F32.9

## 2017-06-18 HISTORY — DX: Sleep apnea, unspecified: G47.30

## 2017-06-18 HISTORY — DX: Unspecified osteoarthritis, unspecified site: M19.90

## 2017-06-18 HISTORY — DX: Myoneural disorder, unspecified: G70.9

## 2017-06-18 HISTORY — DX: Dyspnea, unspecified: R06.00

## 2017-06-18 HISTORY — DX: Pneumonia, unspecified organism: J18.9

## 2017-06-18 NOTE — Progress Notes (Signed)
Pt. No show for PAT, called & reached wife of pt. & she reports that they are at [facility]. Now for Stress test this a.m. History, meds. reviewed over the phone , in addition instructions given for DOS with exception, no discussion regarding blood thinners. Pt. Is now rescheduled for bld. Draw & signing of consent for 12/28,  Per [place]. Call to [facility] for fax number for Dr. Cecelia Byars office to capture last EKG, labs, etc. Because wife reports that an EKG was done w/i 6 months & it is not seen in Epic.

## 2017-06-19 ENCOUNTER — Encounter (HOSPITAL_COMMUNITY)
Admission: RE | Admit: 2017-06-19 | Discharge: 2017-06-19 | Disposition: A | Discharge status: Home / Self Care | Payer: Medicare Other | Source: Ambulatory Visit | Attending: Neurosurgery | Admitting: Neurosurgery

## 2017-06-19 ENCOUNTER — Other Ambulatory Visit: Payer: Self-pay

## 2017-06-19 DIAGNOSIS — Z7901 Long term (current) use of anticoagulants: Secondary | ICD-10-CM | POA: Insufficient documentation

## 2017-06-19 DIAGNOSIS — Z7984 Long term (current) use of oral hypoglycemic drugs: Secondary | ICD-10-CM | POA: Diagnosis not present

## 2017-06-19 DIAGNOSIS — G4733 Obstructive sleep apnea (adult) (pediatric): Secondary | ICD-10-CM | POA: Diagnosis not present

## 2017-06-19 DIAGNOSIS — Z8673 Personal history of transient ischemic attack (TIA), and cerebral infarction without residual deficits: Secondary | ICD-10-CM | POA: Insufficient documentation

## 2017-06-19 DIAGNOSIS — I509 Heart failure, unspecified: Secondary | ICD-10-CM | POA: Insufficient documentation

## 2017-06-19 DIAGNOSIS — E119 Type 2 diabetes mellitus without complications: Secondary | ICD-10-CM | POA: Insufficient documentation

## 2017-06-19 DIAGNOSIS — Z87891 Personal history of nicotine dependence: Secondary | ICD-10-CM | POA: Diagnosis not present

## 2017-06-19 DIAGNOSIS — E785 Hyperlipidemia, unspecified: Secondary | ICD-10-CM | POA: Insufficient documentation

## 2017-06-19 DIAGNOSIS — I4891 Unspecified atrial fibrillation: Secondary | ICD-10-CM | POA: Insufficient documentation

## 2017-06-19 DIAGNOSIS — M4316 Spondylolisthesis, lumbar region: Secondary | ICD-10-CM | POA: Insufficient documentation

## 2017-06-19 DIAGNOSIS — K219 Gastro-esophageal reflux disease without esophagitis: Secondary | ICD-10-CM | POA: Insufficient documentation

## 2017-06-19 DIAGNOSIS — I2510 Atherosclerotic heart disease of native coronary artery without angina pectoris: Secondary | ICD-10-CM | POA: Insufficient documentation

## 2017-06-19 DIAGNOSIS — Z79899 Other long term (current) drug therapy: Secondary | ICD-10-CM | POA: Insufficient documentation

## 2017-06-19 DIAGNOSIS — I110 Hypertensive heart disease with heart failure: Secondary | ICD-10-CM | POA: Diagnosis not present

## 2017-06-19 DIAGNOSIS — Z7982 Long term (current) use of aspirin: Secondary | ICD-10-CM | POA: Diagnosis not present

## 2017-06-19 DIAGNOSIS — Z01812 Encounter for preprocedural laboratory examination: Secondary | ICD-10-CM | POA: Insufficient documentation

## 2017-06-19 LAB — CBC
HEMATOCRIT: 47.1 % (ref 39.0–52.0)
HEMOGLOBIN: 16.5 g/dL (ref 13.0–17.0)
MCH: 30.3 pg (ref 26.0–34.0)
MCHC: 35 g/dL (ref 30.0–36.0)
MCV: 86.6 fL (ref 78.0–100.0)
PLATELETS: 224 10*3/uL (ref 150–400)
RBC: 5.44 MIL/uL (ref 4.22–5.81)
RDW: 14.3 % (ref 11.5–15.5)
WBC: 9.3 10*3/uL (ref 4.0–10.5)

## 2017-06-19 LAB — BASIC METABOLIC PANEL
ANION GAP: 10 (ref 5–15)
BUN: 10 mg/dL (ref 6–20)
CO2: 26 mmol/L (ref 22–32)
Calcium: 8.9 mg/dL (ref 8.9–10.3)
Chloride: 101 mmol/L (ref 101–111)
Creatinine, Ser: 0.91 mg/dL (ref 0.61–1.24)
GFR calc Af Amer: >60 mL/min (ref >60)
GLUCOSE: 149 mg/dL — AB (ref 65–99)
POTASSIUM: 4 mmol/L (ref 3.5–5.1)
Sodium: 137 mmol/L (ref 135–145)

## 2017-06-19 LAB — TYPE AND SCREEN
ABO/RH(D): O NEG
Antibody Screen: NEGATIVE

## 2017-06-19 LAB — ABO/RH: ABO/RH(D): O NEG

## 2017-06-19 LAB — SURGICAL PCR SCREEN
MRSA, PCR: NEGATIVE
Staphylococcus aureus: POSITIVE — AB

## 2017-06-19 LAB — GLUCOSE, CAPILLARY: Glucose-Capillary: 155 mg/dL — ABNORMAL HIGH (ref 65–99)

## 2017-06-19 NOTE — Progress Notes (Signed)
I called a prescription for Mupirocin ointment to [facility], [place], [place].

## 2017-06-19 NOTE — Pre-Procedure Instructions (Signed)
Broderic Bara Stepter  06/19/2017      [place], [place]  [address] Phone: [phone] Fax: [phone]    Your procedure is scheduled on Thursday January 3.  Report to [facility] Admitting at 8:15 A.M.  Call this number if you have problems the morning of surgery:  [phone]   Remember:  Do not eat food or drink liquids after midnight.  Take these medicines the morning of surgery with A SIP OF WATER:   Metoprolol (toprol-XL) Omeprazole (prilosec) Duloxetine (cymbalta) Gabapentin (neurontin) Hydrocodone (Norco) if needed  DO NOT TAKE metformin (glucophage) or glipizide (Glucotrol) the day of surgery.   DO NOT TAKE glipizide (glucotrol) the night before sugery.    prior to surgery today 06/19/17 STOP taking any celebrex (celecoxib), Aleve, Naproxen, Ibuprofen, Motrin, Advil, Goody's, BC's, all herbal medications, fish oil, and all vitamins  **FOLLOW Surgeon's instructions on stopping Aspirin, Plavix (clopidogrel), and warfarin (coumadin). If no instructions were given, please call surgeons office. **    How to Manage Your Diabetes Before and After Surgery  Why is it important to control my blood sugar before and after surgery? . Improving blood sugar levels before and after surgery helps healing and can limit problems. . A way of improving blood sugar control is eating a healthy diet by: o  Eating less sugar and carbohydrates o  Increasing activity/exercise o  Talking with your doctor about reaching your blood sugar goals . High blood sugars (greater than 180 mg/dL) can raise your risk of infections and slow your recovery, so you will need to focus on controlling your diabetes during the weeks before surgery. . Make sure that the doctor who takes care of your diabetes knows about your planned surgery including the date and location.  How do I manage my blood sugar before  surgery? . Check your blood sugar at least 4 times a day, starting 2 days before surgery, to make sure that the level is not too high or low. o Check your blood sugar the morning of your surgery when you wake up and every 2 hours until you get to the Short Stay unit. . If your blood sugar is less than 70 mg/dL, you will need to treat for low blood sugar: o Do not take insulin. o Treat a low blood sugar (less than 70 mg/dL) with  cup of clear juice (cranberry or apple), 4 glucose tablets, OR glucose gel. Recheck blood sugar in 15 minutes after treatment (to make sure it is greater than 70 mg/dL). If your blood sugar is not greater than 70 mg/dL on recheck, call [phone] o  for further instructions. . Report your blood sugar to the short stay nurse when you get to Short Stay.  . If you are admitted to the hospital after surgery: o Your blood sugar will be checked by the staff and you will probably be given insulin after surgery (instead of oral diabetes medicines) to make sure you have good blood sugar levels. o The goal for blood sugar control after surgery is 80-180 mg/dL.     Do not wear jewelry  Do not wear lotions, powders, or colognes, or deodorant.  Do not shave 48 hours prior to surgery.  Men may shave face and neck.  Do not bring valuables to the hospital.  [facility] is not responsible for any belongings or valuables.  Contacts, dentures or bridgework  may not be worn into surgery.  Leave your suitcase in the car.  After surgery it may be brought to your room.  For patients admitted to the hospital, discharge time will be determined by your treatment team.  Patients discharged the day of surgery will not be allowed to drive home.    Special instructions:    [place]- Preparing For Surgery  Before surgery, you can play an important role. Because skin is not sterile, your skin needs to be as free of germs as possible. You can reduce the number of germs on your skin by  washing with CHG (chlorahexidine gluconate) Soap before surgery.  CHG is an antiseptic cleaner which kills germs and bonds with the skin to continue killing germs even after washing.  Please do not use if you have an allergy to CHG or antibacterial soaps. If your skin becomes reddened/irritated stop using the CHG.  Do not shave (including legs and underarms) for at least 48 hours prior to first CHG shower. It is OK to shave your face.  Please follow these instructions carefully.   1. Shower the NIGHT BEFORE SURGERY and the MORNING OF SURGERY with CHG.   2. If you chose to wash your hair, wash your hair first as usual with your normal shampoo.  3. After you shampoo, rinse your hair and body thoroughly to remove the shampoo.  4. Use CHG as you would any other liquid soap. You can apply CHG directly to the skin and wash gently with a scrungie or a clean washcloth.   5. Apply the CHG Soap to your body ONLY FROM THE NECK DOWN.  Do not use on open wounds or open sores. Avoid contact with your eyes, ears, mouth and genitals (private parts). Wash Face and genitals (private parts)  with your normal soap.  6. Wash thoroughly, paying special attention to the area where your surgery will be performed.  7. Thoroughly rinse your body with warm water from the neck down.  8. DO NOT shower/wash with your normal soap after using and rinsing off the CHG Soap.  9. Pat yourself dry with a CLEAN TOWEL.  10. Wear CLEAN PAJAMAS to bed the night before surgery, wear comfortable clothes the morning of surgery  11. Place CLEAN SHEETS on your bed the night of your first shower and DO NOT SLEEP WITH PETS.    Day of Surgery: Do not apply any deodorants/lotions. Please wear clean clothes to the hospital/surgery center.      Please read over the fact sheets that you were given.

## 2017-06-19 NOTE — Pre-Procedure Instructions (Signed)
Travis Watts  06/19/2017      [place], [place] [address] Phone: [phone] Fax: [phone]    Your procedure is scheduled on Thursday January 3.  Report to [facility] Admitting at 8:15 A.M.  Call this number if you have problems the morning of surgery:  [phone]   Remember:  Do not eat food or drink liquids after midnight.  Take these medicines the morning of surgery with A SIP OF WATER:   Metoprolol (toprol-XL) Omeprazole (prilosec) Duloxetine (cymbalta) Gabapentin (neurontin) Hydrocodone (Norco) if needed  DO NOT TAKE metformin (glucophage) or glipizide (Glucotrol) the day of surgery.   DO NOT TAKE glipizide (glucotrol) the night before sugery.    prior to surgery today 06/19/17 STOP taking any celebrex (celecoxib), Aleve, Naproxen, Ibuprofen, Motrin, Advil, Goody's, BC's, all herbal medications, fish oil, and all vitamins  **FOLLOW Surgeon's instructions on stopping Aspirin, Plavix (clopidogrel), and warfarin (coumadin). If no instructions were given, please call surgeons office. **    How to Manage Your Diabetes Before and After Surgery  Why is it important to control my blood sugar before and after surgery? . Improving blood sugar levels before and after surgery helps healing and can limit problems. . A way of improving blood sugar control is eating a healthy diet by: o  Eating less sugar and carbohydrates o  Increasing activity/exercise o  Talking with your doctor about reaching your blood sugar goals . High blood sugars (greater than 180 mg/dL) can raise your risk of infections and slow your recovery, so you will need to focus on controlling your diabetes during the weeks before surgery. . Make sure that the doctor who takes care of your diabetes knows about your planned surgery including the date and location.  How do I manage my blood sugar before  surgery? . Check your blood sugar at least 4 times a day, starting 2 days before surgery, to make sure that the level is not too high or low. o Check your blood sugar the morning of your surgery when you wake up and every 2 hours until you get to the Short Stay unit. . If your blood sugar is less than 70 mg/dL, you will need to treat for low blood sugar: o Do not take insulin. o Treat a low blood sugar (less than 70 mg/dL) with  cup of clear juice (cranberry or apple), 4 glucose tablets, OR glucose gel. Recheck blood sugar in 15 minutes after treatment (to make sure it is greater than 70 mg/dL). If your blood sugar is not greater than 70 mg/dL on recheck, call [phone] o  for further instructions. . Report your blood sugar to the short stay nurse when you get to Short Stay.  . If you are admitted to the hospital after surgery: o Your blood sugar will be checked by the staff and you will probably be given insulin after surgery (instead of oral diabetes medicines) to make sure you have good blood sugar levels. o The goal for blood sugar control after surgery is 80-180 mg/dL.     Do not wear jewelry  Do not wear lotions, powders, or colognes, or deodorant.  Do not shave 48 hours prior to surgery.  Men may shave face and neck.  Do not bring valuables to the hospital.  [facility] is not responsible for any belongings or valuables.  Contacts, dentures or bridgework  may not be worn into surgery.  Leave your suitcase in the car.  After surgery it may be brought to your room.  For patients admitted to the hospital, discharge time will be determined by your treatment team.  Patients discharged the day of surgery will not be allowed to drive home.    Special instructions:    [place]- Preparing For Surgery  Before surgery, you can play an important role. Because skin is not sterile, your skin needs to be as free of germs as possible. You can reduce the number of germs on your skin by  washing with CHG (chlorahexidine gluconate) Soap before surgery.  CHG is an antiseptic cleaner which kills germs and bonds with the skin to continue killing germs even after washing.  Please do not use if you have an allergy to CHG or antibacterial soaps. If your skin becomes reddened/irritated stop using the CHG.  Do not shave (including legs and underarms) for at least 48 hours prior to first CHG shower. It is OK to shave your face.  Please follow these instructions carefully.   1. Shower the NIGHT BEFORE SURGERY and the MORNING OF SURGERY with CHG.   2. If you chose to wash your hair, wash your hair first as usual with your normal shampoo.  3. After you shampoo, rinse your hair and body thoroughly to remove the shampoo.  4. Use CHG as you would any other liquid soap. You can apply CHG directly to the skin and wash gently with a scrungie or a clean washcloth.   5. Apply the CHG Soap to your body ONLY FROM THE NECK DOWN.  Do not use on open wounds or open sores. Avoid contact with your eyes, ears, mouth and genitals (private parts). Wash Face and genitals (private parts)  with your normal soap.  6. Wash thoroughly, paying special attention to the area where your surgery will be performed.  7. Thoroughly rinse your body with warm water from the neck down.  8. DO NOT shower/wash with your normal soap after using and rinsing off the CHG Soap.  9. Pat yourself dry with a CLEAN TOWEL.  10. Wear CLEAN PAJAMAS to bed the night before surgery, wear comfortable clothes the morning of surgery  11. Place CLEAN SHEETS on your bed the night of your first shower and DO NOT SLEEP WITH PETS.    Day of Surgery: Do not apply any deodorants/lotions. Please wear clean clothes to the hospital/surgery center.      Please read over the fact sheets that you were given.

## 2017-06-22 ENCOUNTER — Telehealth: Payer: Self-pay | Admitting: Cardiology

## 2017-06-22 MED ORDER — CLOPIDOGREL BISULFATE 75 MG PO TABS: 75.0000 mg | ORAL_TABLET | Freq: Every day | ORAL | 11 refills | Status: DC

## 2017-06-22 NOTE — Progress Notes (Signed)
Anesthesia Chart Review:  Pt is a 71 year old male scheduled for L4-5 PLIF, interbody prosthesis, posterior instrumentation; L2-3, L3-4 laminectomy/laminotomy on 06/25/2016 with Newman Pies, M.D.  - PCP is Cyndy Freeze, MD  - Cardiologist is [name], MD. Per note in Epic by Warner Mccreedy, RN 06/22/17 notes "Patient advised stress test reviewed by Dr. Agustin Cree from [facility] as stable. Patient advised to hold plavix starting today. Patient stopped his warfarin himself and had Lovenox 150 mg left from his last surgery. He has been taking the Lovenox twice daily. Per Dr. Agustin Cree no need to check pt/inr as patient has been off warfarin for 6 days and taking Lovenox. Advised patient to stop Lovenox 24 hours prior to surgery."   PMH includes: CAD, CHF, atrial fibrillation, stroke, HTN, DM, hyperlipidemia, OSA, GERD. Former smoker. BMI 41.  Medications include: ASA 81 mg, Lipitor, plavix, digoxin, Lasix, glipizide, lisinopril, metformin, metoprolol, Prilosec, Seroquel, Coumadin.  BP (!) 163/87   Pulse (!) 46 Comment: will inform Ulis Rias, RN  Temp 36.8 C (Oral)   Resp 20   Ht 6' (1.829 m)   Wt (!) 300 lb 7.8 oz (136.3 kg)   SpO2 98%   BMI 40.75 kg/m   Preoperative labs reviewed.   - glucose 149 - PT/INR will be obtained day of surgery  EKG 09/25/16 ([place]): - Atrial fibrillation (80 bpm) with premature ventricular or aberrantly conducted complexes. RAD. Septal infarct, age undetermined. ST & T wave abnormality, consider inferior ischemia or digitalis effect.  Nuclear stress test 06/18/17 ([place]): 1. Area of prior infarct in the anterior lateral wall with peri-infarct ischemia. The degree of reversibility is less than that seen on the prior exam which showed near complete reversibility. No other area fixed or reversible abnormality noted 2. Normal LV wall motion. EF 59%. 3. Noninvasive risk stratification: Intermediate  Carotid duplex 05/20/17:  -  Right Carotid: There is no evidence of stenosis in the right ICA. There was no evidence of thrombus, dissection, atherosclerotic plaque or stenosis in the cervical carotid system. - Left Carotid: There is no evidence of stenosis in the left ICA. There was no evidence of thrombus, dissection, atherosclerotic plaque or stenosis in the cervical carotid system. - Vertebrals: Both vertebral arteries were patent with antegrade flow. - Subclavians: Normal flow hemodynamics were seen in bilateral subclavian arteries.  Echo 04/23/17:  Left ventricle: The cavity size was normal. Wall thickness was increased in a pattern of moderate LVH. Systolic function was normal. Regional wall motion abnormalities cannot be excluded. The study is not technically sufficient to allow evaluation of LV diastolic function. - Left atrium: The atrium was moderately to severely dilated. - Right atrium: The atrium was moderately dilated.  Cardiac cath 06/19/16:  - LMCA: Normal appearance with 0% stenosis. - LAD: Focal stenosis. Lesion on Mid LAD: Mid subsection.60% stenosis 20 mm length . - LCx: Normal appearance with 0% stenosis. - RCA: Multiple stenosis. Lesion on Dist RCA: Mid subsection.40% stenosis 8 mm length .  If no changes, I anticipate pt can proceed with surgery as scheduled.   Willeen Cass, FNP-BC [facility] Short Stay Surgical Center/Anesthesiology Phone: [phone] 06/22/2017 3:11 PM

## 2017-06-22 NOTE — Telephone Encounter (Signed)
Patient would like results of tests and discussion of meds.

## 2017-06-22 NOTE — Telephone Encounter (Signed)
Refill sent.

## 2017-06-22 NOTE — Telephone Encounter (Signed)
Patient advised stress test reviewed by Dr. Agustin Cree from [facility] as stable. Patient advised to hold plavix starting today. Patient stopped his warfarin himself and had Lovenox 150 mg left from his last surgery. He has been taking the Lovenox twice daily. Per Dr. Agustin Cree no need to check pt/inr as patient has been off warfarin for 6 days and taking Lovenox. Advised patient to stop Lovenox 24 hours prior to surgery. Patient verbalized understanding, no further questions.

## 2017-06-22 NOTE — Telephone Encounter (Signed)
New message     *STAT* If patient is at the pharmacy, call can be transferred to refill team.   1. Which medications need to be refilled? (please list name of each medication and dose if known) clopidogrel (PLAVIX) 75 MG tablet  2. Which pharmacy/location (including street and city if local pharmacy) is medication to be sent to? [place]  3. Do they need a 30 day or 90 day supply? [place]

## 2017-06-24 ENCOUNTER — Encounter (HOSPITAL_COMMUNITY): Payer: Medicare Other

## 2017-06-24 ENCOUNTER — Ambulatory Visit (HOSPITAL_COMMUNITY): Payer: Medicare Other

## 2017-06-24 MED ORDER — DEXTROSE 5 % IV SOLN
3.0000 g | INTRAVENOUS | Status: AC
  Administered 2017-06-25: 3 g via INTRAVENOUS
  Filled 2017-06-24: qty 3

## 2017-06-25 ENCOUNTER — Inpatient Hospital Stay (HOSPITAL_COMMUNITY): Payer: Medicare Other

## 2017-06-25 ENCOUNTER — Encounter (HOSPITAL_COMMUNITY)
Admission: RE | Disposition: A | Discharge status: Home / Self Care | Payer: Self-pay | Source: Ambulatory Visit | Attending: Neurosurgery

## 2017-06-25 ENCOUNTER — Other Ambulatory Visit: Payer: Self-pay

## 2017-06-25 ENCOUNTER — Inpatient Hospital Stay (HOSPITAL_COMMUNITY)
Admission: RE | Admit: 2017-06-25 | Discharge: 2017-06-27 | DRG: 455 | Disposition: A | Discharge status: Home / Self Care | Payer: Medicare Other | Source: Ambulatory Visit | Attending: Neurosurgery | Admitting: Neurosurgery

## 2017-06-25 ENCOUNTER — Inpatient Hospital Stay (HOSPITAL_COMMUNITY): Payer: Medicare Other | Admitting: Emergency Medicine

## 2017-06-25 ENCOUNTER — Inpatient Hospital Stay (HOSPITAL_COMMUNITY): Payer: Medicare Other | Admitting: Certified Registered"

## 2017-06-25 ENCOUNTER — Encounter (HOSPITAL_COMMUNITY): Payer: Self-pay | Admitting: *Deleted

## 2017-06-25 DIAGNOSIS — Z8249 Family history of ischemic heart disease and other diseases of the circulatory system: Secondary | ICD-10-CM

## 2017-06-25 DIAGNOSIS — Z7984 Long term (current) use of oral hypoglycemic drugs: Secondary | ICD-10-CM

## 2017-06-25 DIAGNOSIS — E785 Hyperlipidemia, unspecified: Secondary | ICD-10-CM | POA: Diagnosis present

## 2017-06-25 DIAGNOSIS — M5116 Intervertebral disc disorders with radiculopathy, lumbar region: Secondary | ICD-10-CM | POA: Diagnosis not present

## 2017-06-25 DIAGNOSIS — I509 Heart failure, unspecified: Secondary | ICD-10-CM | POA: Diagnosis not present

## 2017-06-25 DIAGNOSIS — Z8701 Personal history of pneumonia (recurrent): Secondary | ICD-10-CM | POA: Diagnosis not present

## 2017-06-25 DIAGNOSIS — M545 Low back pain: Secondary | ICD-10-CM | POA: Diagnosis not present

## 2017-06-25 DIAGNOSIS — Z8261 Family history of arthritis: Secondary | ICD-10-CM | POA: Diagnosis not present

## 2017-06-25 DIAGNOSIS — M48061 Spinal stenosis, lumbar region without neurogenic claudication: Secondary | ICD-10-CM | POA: Diagnosis present

## 2017-06-25 DIAGNOSIS — Z85828 Personal history of other malignant neoplasm of skin: Secondary | ICD-10-CM | POA: Diagnosis not present

## 2017-06-25 DIAGNOSIS — I4891 Unspecified atrial fibrillation: Secondary | ICD-10-CM | POA: Diagnosis present

## 2017-06-25 DIAGNOSIS — Z96653 Presence of artificial knee joint, bilateral: Secondary | ICD-10-CM | POA: Diagnosis present

## 2017-06-25 DIAGNOSIS — Z85118 Personal history of other malignant neoplasm of bronchus and lung: Secondary | ICD-10-CM

## 2017-06-25 DIAGNOSIS — Z96669 Presence of unspecified artificial ankle joint: Secondary | ICD-10-CM | POA: Diagnosis present

## 2017-06-25 DIAGNOSIS — I110 Hypertensive heart disease with heart failure: Secondary | ICD-10-CM | POA: Diagnosis present

## 2017-06-25 DIAGNOSIS — F329 Major depressive disorder, single episode, unspecified: Secondary | ICD-10-CM | POA: Diagnosis not present

## 2017-06-25 DIAGNOSIS — Z419 Encounter for procedure for purposes other than remedying health state, unspecified: Secondary | ICD-10-CM

## 2017-06-25 DIAGNOSIS — Z7902 Long term (current) use of antithrombotics/antiplatelets: Secondary | ICD-10-CM

## 2017-06-25 DIAGNOSIS — Z7901 Long term (current) use of anticoagulants: Secondary | ICD-10-CM

## 2017-06-25 DIAGNOSIS — F419 Anxiety disorder, unspecified: Secondary | ICD-10-CM | POA: Diagnosis present

## 2017-06-25 DIAGNOSIS — Z8349 Family history of other endocrine, nutritional and metabolic diseases: Secondary | ICD-10-CM

## 2017-06-25 DIAGNOSIS — M4316 Spondylolisthesis, lumbar region: Secondary | ICD-10-CM | POA: Diagnosis present

## 2017-06-25 DIAGNOSIS — Z96612 Presence of left artificial shoulder joint: Secondary | ICD-10-CM | POA: Diagnosis present

## 2017-06-25 DIAGNOSIS — M48062 Spinal stenosis, lumbar region with neurogenic claudication: Secondary | ICD-10-CM | POA: Diagnosis present

## 2017-06-25 DIAGNOSIS — I2510 Atherosclerotic heart disease of native coronary artery without angina pectoris: Secondary | ICD-10-CM | POA: Diagnosis present

## 2017-06-25 DIAGNOSIS — G4730 Sleep apnea, unspecified: Secondary | ICD-10-CM | POA: Diagnosis present

## 2017-06-25 DIAGNOSIS — K219 Gastro-esophageal reflux disease without esophagitis: Secondary | ICD-10-CM | POA: Diagnosis present

## 2017-06-25 DIAGNOSIS — M5136 Other intervertebral disc degeneration, lumbar region: Secondary | ICD-10-CM | POA: Diagnosis not present

## 2017-06-25 DIAGNOSIS — E1342 Other specified diabetes mellitus with diabetic polyneuropathy: Secondary | ICD-10-CM | POA: Diagnosis present

## 2017-06-25 DIAGNOSIS — Z8673 Personal history of transient ischemic attack (TIA), and cerebral infarction without residual deficits: Secondary | ICD-10-CM | POA: Diagnosis not present

## 2017-06-25 DIAGNOSIS — Z79899 Other long term (current) drug therapy: Secondary | ICD-10-CM

## 2017-06-25 DIAGNOSIS — M4326 Fusion of spine, lumbar region: Secondary | ICD-10-CM | POA: Diagnosis not present

## 2017-06-25 DIAGNOSIS — Z79891 Long term (current) use of opiate analgesic: Secondary | ICD-10-CM

## 2017-06-25 DIAGNOSIS — Z823 Family history of stroke: Secondary | ICD-10-CM

## 2017-06-25 DIAGNOSIS — Z7982 Long term (current) use of aspirin: Secondary | ICD-10-CM

## 2017-06-25 DIAGNOSIS — Z818 Family history of other mental and behavioral disorders: Secondary | ICD-10-CM | POA: Diagnosis not present

## 2017-06-25 HISTORY — DX: Spondylolisthesis, lumbar region: M43.16

## 2017-06-25 LAB — GLUCOSE, CAPILLARY
GLUCOSE-CAPILLARY: 252 mg/dL — AB (ref 65–99)
GLUCOSE-CAPILLARY: 298 mg/dL — AB (ref 65–99)
Glucose-Capillary: 259 mg/dL — ABNORMAL HIGH (ref 65–99)
Glucose-Capillary: 261 mg/dL — ABNORMAL HIGH (ref 65–99)

## 2017-06-25 LAB — HEMOGLOBIN A1C
HEMOGLOBIN A1C: 7.1 % — AB (ref 4.8–5.6)
Mean Plasma Glucose: 157.07 mg/dL

## 2017-06-25 LAB — PROTIME-INR
INR: 1
Prothrombin Time: 13.1 seconds (ref 11.4–15.2)

## 2017-06-25 SURGERY — POSTERIOR LUMBAR FUSION 1 LEVEL
Anesthesia: General | Site: Spine Lumbar

## 2017-06-25 MED ORDER — GLIPIZIDE 10 MG PO TABS
10.0000 mg | ORAL_TABLET | Freq: Two times a day (BID) | ORAL | Status: DC
  Administered 2017-06-25 – 2017-06-27 (×4): 10 mg via ORAL
  Filled 2017-06-25 (×4): qty 1

## 2017-06-25 MED ORDER — INSULIN ASPART 100 UNIT/ML ~~LOC~~ SOLN
0.0000 [IU] | Freq: Three times a day (TID) | SUBCUTANEOUS | Status: DC
  Administered 2017-06-26 (×3): 4 [IU] via SUBCUTANEOUS
  Administered 2017-06-27: 7 [IU] via SUBCUTANEOUS

## 2017-06-25 MED ORDER — SODIUM CHLORIDE 0.9 % IR SOLN
Status: DC | PRN
  Administered 2017-06-25: 500 mL

## 2017-06-25 MED ORDER — THROMBIN (RECOMBINANT) 5000 UNITS EX SOLR
CUTANEOUS | Status: AC
  Filled 2017-06-25: qty 5000

## 2017-06-25 MED ORDER — OXYCODONE HCL 5 MG/5ML PO SOLN: 5.0000 mg | Freq: Once | ORAL | Status: DC | PRN

## 2017-06-25 MED ORDER — BUPIVACAINE-EPINEPHRINE (PF) 0.5% -1:200000 IJ SOLN
INTRAMUSCULAR | Status: AC
  Filled 2017-06-25: qty 30

## 2017-06-25 MED ORDER — METOPROLOL SUCCINATE ER 25 MG PO TB24
25.0000 mg | ORAL_TABLET | Freq: Every day | ORAL | Status: DC
  Administered 2017-06-26: 25 mg via ORAL
  Filled 2017-06-25: qty 1

## 2017-06-25 MED ORDER — BUPIVACAINE-EPINEPHRINE (PF) 0.5% -1:200000 IJ SOLN
INTRAMUSCULAR | Status: DC | PRN
  Administered 2017-06-25: 10 mL

## 2017-06-25 MED ORDER — BISACODYL 10 MG RE SUPP: 10.0000 mg | Freq: Every day | RECTAL | Status: DC | PRN

## 2017-06-25 MED ORDER — ONDANSETRON HCL 4 MG/2ML IJ SOLN
INTRAMUSCULAR | Status: AC
  Filled 2017-06-25: qty 2

## 2017-06-25 MED ORDER — PROPOFOL 10 MG/ML IV BOLUS
INTRAVENOUS | Status: DC | PRN
  Administered 2017-06-25: 130 mg via INTRAVENOUS

## 2017-06-25 MED ORDER — MIDAZOLAM HCL 2 MG/2ML IJ SOLN
INTRAMUSCULAR | Status: DC | PRN
  Administered 2017-06-25: 2 mg via INTRAVENOUS

## 2017-06-25 MED ORDER — EPHEDRINE 5 MG/ML INJ
INTRAVENOUS | Status: AC
  Filled 2017-06-25: qty 20

## 2017-06-25 MED ORDER — DOCUSATE SODIUM 100 MG PO CAPS
100.0000 mg | ORAL_CAPSULE | Freq: Two times a day (BID) | ORAL | Status: DC
  Administered 2017-06-25 – 2017-06-26 (×3): 100 mg via ORAL
  Filled 2017-06-25 (×3): qty 1

## 2017-06-25 MED ORDER — MAGNESIUM OXIDE 400 (241.3 MG) MG PO TABS
400.0000 mg | ORAL_TABLET | Freq: Every day | ORAL | Status: DC
  Administered 2017-06-26: 400 mg via ORAL
  Filled 2017-06-25: qty 1

## 2017-06-25 MED ORDER — LACTATED RINGERS IV SOLN
INTRAVENOUS | Status: DC | PRN
  Administered 2017-06-25 (×2): via INTRAVENOUS

## 2017-06-25 MED ORDER — LACTATED RINGERS IV SOLN
INTRAVENOUS | Status: DC
  Administered 2017-06-25 (×2): via INTRAVENOUS

## 2017-06-25 MED ORDER — ONDANSETRON HCL 4 MG/2ML IJ SOLN
INTRAMUSCULAR | Status: DC | PRN
  Administered 2017-06-25: 4 mg via INTRAVENOUS

## 2017-06-25 MED ORDER — LISINOPRIL 20 MG PO TABS
20.0000 mg | ORAL_TABLET | Freq: Every day | ORAL | Status: DC
  Administered 2017-06-26: 20 mg via ORAL
  Filled 2017-06-25: qty 1

## 2017-06-25 MED ORDER — HEMOSTATIC AGENTS (NO CHARGE) OPTIME
TOPICAL | Status: DC | PRN
  Administered 2017-06-25 (×2): 1 via TOPICAL

## 2017-06-25 MED ORDER — TAMSULOSIN HCL 0.4 MG PO CAPS
0.4000 mg | ORAL_CAPSULE | Freq: Every day | ORAL | Status: DC
  Administered 2017-06-25 – 2017-06-26 (×2): 0.4 mg via ORAL
  Filled 2017-06-25 (×2): qty 1

## 2017-06-25 MED ORDER — DIGOXIN 125 MCG PO TABS
0.1250 mg | ORAL_TABLET | Freq: Every day | ORAL | Status: DC
  Administered 2017-06-25 – 2017-06-26 (×2): 0.125 mg via ORAL
  Filled 2017-06-25 (×2): qty 1

## 2017-06-25 MED ORDER — EPHEDRINE SULFATE-NACL 50-0.9 MG/10ML-% IV SOSY
PREFILLED_SYRINGE | INTRAVENOUS | Status: DC | PRN
Start: 2017-06-25 — End: 2017-06-25
  Administered 2017-06-25 (×3): 25 mg via INTRAVENOUS

## 2017-06-25 MED ORDER — ROCURONIUM BROMIDE 10 MG/ML (PF) SYRINGE
PREFILLED_SYRINGE | INTRAVENOUS | Status: DC | PRN
  Administered 2017-06-25: 50 mg via INTRAVENOUS
  Administered 2017-06-25: 25 mg via INTRAVENOUS
  Administered 2017-06-25 (×2): 50 mg via INTRAVENOUS

## 2017-06-25 MED ORDER — OXYCODONE HCL 5 MG PO TABS: 5.0000 mg | ORAL_TABLET | Freq: Once | ORAL | Status: DC | PRN

## 2017-06-25 MED ORDER — FUROSEMIDE 40 MG PO TABS
80.0000 mg | ORAL_TABLET | ORAL | Status: DC
  Administered 2017-06-26: 80 mg via ORAL
  Filled 2017-06-25: qty 2

## 2017-06-25 MED ORDER — DARIFENACIN HYDROBROMIDE ER 7.5 MG PO TB24
7.5000 mg | ORAL_TABLET | Freq: Every day | ORAL | Status: DC
  Administered 2017-06-26 – 2017-06-27 (×2): 7.5 mg via ORAL
  Filled 2017-06-25 (×2): qty 1

## 2017-06-25 MED ORDER — THROMBIN (RECOMBINANT) 5000 UNITS EX SOLR
CUTANEOUS | Status: DC | PRN
  Administered 2017-06-25 (×2): 5000 [IU] via TOPICAL

## 2017-06-25 MED ORDER — SODIUM CHLORIDE 0.9% FLUSH: 3.0000 mL | INTRAVENOUS | Status: DC | PRN

## 2017-06-25 MED ORDER — GABAPENTIN 400 MG PO CAPS
400.0000 mg | ORAL_CAPSULE | Freq: Every day | ORAL | Status: DC
  Administered 2017-06-25 – 2017-06-27 (×3): 400 mg via ORAL
  Filled 2017-06-25 (×3): qty 1

## 2017-06-25 MED ORDER — PHENYLEPHRINE 40 MCG/ML (10ML) SYRINGE FOR IV PUSH (FOR BLOOD PRESSURE SUPPORT)
PREFILLED_SYRINGE | INTRAVENOUS | Status: AC
  Filled 2017-06-25: qty 10

## 2017-06-25 MED ORDER — HYDROMORPHONE HCL 1 MG/ML IJ SOLN
INTRAMUSCULAR | Status: AC
  Filled 2017-06-25: qty 0.5

## 2017-06-25 MED ORDER — LIDOCAINE 2% (20 MG/ML) 5 ML SYRINGE
INTRAMUSCULAR | Status: AC
  Filled 2017-06-25: qty 5

## 2017-06-25 MED ORDER — HYDROMORPHONE HCL 1 MG/ML IJ SOLN: 0.2500 mg | INTRAMUSCULAR | Status: DC | PRN

## 2017-06-25 MED ORDER — GABAPENTIN 400 MG PO CAPS
800.0000 mg | ORAL_CAPSULE | Freq: Every day | ORAL | Status: DC
  Administered 2017-06-25 – 2017-06-26 (×2): 800 mg via ORAL
  Filled 2017-06-25 (×2): qty 2

## 2017-06-25 MED ORDER — CEFAZOLIN SODIUM-DEXTROSE 2-4 GM/100ML-% IV SOLN
2.0000 g | Freq: Three times a day (TID) | INTRAVENOUS | Status: AC
  Administered 2017-06-25 – 2017-06-26 (×2): 2 g via INTRAVENOUS
  Filled 2017-06-25 (×2): qty 100

## 2017-06-25 MED ORDER — FENTANYL CITRATE (PF) 250 MCG/5ML IJ SOLN
INTRAMUSCULAR | Status: AC
  Filled 2017-06-25: qty 5

## 2017-06-25 MED ORDER — ARTIFICIAL TEARS OPHTHALMIC OINT
TOPICAL_OINTMENT | OPHTHALMIC | Status: DC | PRN
  Administered 2017-06-25: 1 via OPHTHALMIC

## 2017-06-25 MED ORDER — VANCOMYCIN HCL 1000 MG IV SOLR
INTRAVENOUS | Status: DC | PRN
  Administered 2017-06-25: 1000 mg via TOPICAL

## 2017-06-25 MED ORDER — SODIUM CHLORIDE 0.9% FLUSH
3.0000 mL | Freq: Two times a day (BID) | INTRAVENOUS | Status: DC
  Administered 2017-06-25: 3 mL via INTRAVENOUS

## 2017-06-25 MED ORDER — ROCURONIUM BROMIDE 10 MG/ML (PF) SYRINGE
PREFILLED_SYRINGE | INTRAVENOUS | Status: AC
  Filled 2017-06-25: qty 10

## 2017-06-25 MED ORDER — MIDAZOLAM HCL 2 MG/2ML IJ SOLN
INTRAMUSCULAR | Status: AC
  Filled 2017-06-25: qty 2

## 2017-06-25 MED ORDER — INSULIN ASPART 100 UNIT/ML ~~LOC~~ SOLN
0.0000 [IU] | Freq: Every day | SUBCUTANEOUS | Status: DC
  Administered 2017-06-25: 3 [IU] via SUBCUTANEOUS

## 2017-06-25 MED ORDER — CYCLOBENZAPRINE HCL 10 MG PO TABS
10.0000 mg | ORAL_TABLET | Freq: Three times a day (TID) | ORAL | Status: DC | PRN
  Administered 2017-06-25 – 2017-06-26 (×2): 10 mg via ORAL
  Filled 2017-06-25 (×2): qty 1

## 2017-06-25 MED ORDER — MORPHINE SULFATE (PF) 4 MG/ML IV SOLN
4.0000 mg | INTRAVENOUS | Status: DC | PRN
  Administered 2017-06-25: 4 mg via INTRAVENOUS
  Filled 2017-06-25: qty 1

## 2017-06-25 MED ORDER — VANCOMYCIN HCL 1000 MG IV SOLR
INTRAVENOUS | Status: AC
  Filled 2017-06-25: qty 1000

## 2017-06-25 MED ORDER — SODIUM CHLORIDE 0.9 % IV SOLN: 250.0000 mL | INTRAVENOUS | Status: DC

## 2017-06-25 MED ORDER — PHENYLEPHRINE HCL 10 MG/ML IJ SOLN
INTRAVENOUS | Status: DC | PRN
  Administered 2017-06-25: 30 ug/min via INTRAVENOUS

## 2017-06-25 MED ORDER — ACETAMINOPHEN 650 MG RE SUPP
650.0000 mg | RECTAL | Status: DC | PRN
Start: 2017-06-25 — End: 2017-06-27

## 2017-06-25 MED ORDER — FENTANYL CITRATE (PF) 250 MCG/5ML IJ SOLN
INTRAMUSCULAR | Status: DC | PRN
  Administered 2017-06-25 (×2): 50 ug via INTRAVENOUS
  Administered 2017-06-25: 150 ug via INTRAVENOUS
  Administered 2017-06-25 (×2): 50 ug via INTRAVENOUS

## 2017-06-25 MED ORDER — PROPOFOL 10 MG/ML IV BOLUS
INTRAVENOUS | Status: AC
  Filled 2017-06-25: qty 20

## 2017-06-25 MED ORDER — CHLORHEXIDINE GLUCONATE CLOTH 2 % EX PADS: 6.0000 | MEDICATED_PAD | Freq: Once | CUTANEOUS | Status: DC

## 2017-06-25 MED ORDER — ALBUMIN HUMAN 5 % IV SOLN
INTRAVENOUS | Status: DC | PRN
  Administered 2017-06-25: 11:00:00 via INTRAVENOUS

## 2017-06-25 MED ORDER — HYDROCODONE-ACETAMINOPHEN 10-325 MG PO TABS: 1.0000 | ORAL_TABLET | ORAL | Status: DC | PRN

## 2017-06-25 MED ORDER — SUGAMMADEX SODIUM 200 MG/2ML IV SOLN
INTRAVENOUS | Status: DC | PRN
  Administered 2017-06-25: 200 mg via INTRAVENOUS

## 2017-06-25 MED ORDER — BACITRACIN ZINC 500 UNIT/GM EX OINT
TOPICAL_OINTMENT | CUTANEOUS | Status: AC
  Filled 2017-06-25: qty 28.35

## 2017-06-25 MED ORDER — HYDROMORPHONE HCL 1 MG/ML IJ SOLN
INTRAMUSCULAR | Status: DC | PRN
  Administered 2017-06-25: 0.5 mg via INTRAVENOUS

## 2017-06-25 MED ORDER — PHENYLEPHRINE HCL 10 MG/ML IJ SOLN
INTRAMUSCULAR | Status: AC
  Filled 2017-06-25: qty 1

## 2017-06-25 MED ORDER — PHENOL 1.4 % MT LIQD: 1.0000 | OROMUCOSAL | Status: DC | PRN

## 2017-06-25 MED ORDER — VITAMIN D3 25 MCG (1000 UNIT) PO TABS
2000.0000 [IU] | ORAL_TABLET | Freq: Every day | ORAL | Status: DC
  Administered 2017-06-26: 2000 [IU] via ORAL
  Filled 2017-06-25 (×3): qty 2

## 2017-06-25 MED ORDER — DEXAMETHASONE SODIUM PHOSPHATE 10 MG/ML IJ SOLN
INTRAMUSCULAR | Status: AC
  Filled 2017-06-25: qty 1

## 2017-06-25 MED ORDER — OXYCODONE HCL 5 MG PO TABS
10.0000 mg | ORAL_TABLET | ORAL | Status: DC | PRN
  Administered 2017-06-25 – 2017-06-26 (×6): 10 mg via ORAL
  Filled 2017-06-25 (×6): qty 2

## 2017-06-25 MED ORDER — PHENYLEPHRINE 40 MCG/ML (10ML) SYRINGE FOR IV PUSH (FOR BLOOD PRESSURE SUPPORT)
PREFILLED_SYRINGE | INTRAVENOUS | Status: DC | PRN
  Administered 2017-06-25: 80 ug via INTRAVENOUS
  Administered 2017-06-25: 120 ug via INTRAVENOUS
  Administered 2017-06-25: 200 ug via INTRAVENOUS

## 2017-06-25 MED ORDER — LIDOCAINE 2% (20 MG/ML) 5 ML SYRINGE
INTRAMUSCULAR | Status: DC | PRN
  Administered 2017-06-25: 60 mg via INTRAVENOUS

## 2017-06-25 MED ORDER — PANTOPRAZOLE SODIUM 40 MG PO TBEC
40.0000 mg | DELAYED_RELEASE_TABLET | Freq: Every day | ORAL | Status: DC
  Administered 2017-06-26: 40 mg via ORAL
  Filled 2017-06-25: qty 1

## 2017-06-25 MED ORDER — ACETAMINOPHEN 325 MG PO TABS
650.0000 mg | ORAL_TABLET | ORAL | Status: DC | PRN
  Administered 2017-06-25 – 2017-06-26 (×2): 650 mg via ORAL
  Filled 2017-06-25 (×2): qty 2

## 2017-06-25 MED ORDER — ATORVASTATIN CALCIUM 20 MG PO TABS
20.0000 mg | ORAL_TABLET | Freq: Every day | ORAL | Status: DC
  Administered 2017-06-25 – 2017-06-26 (×2): 20 mg via ORAL
  Filled 2017-06-25 (×2): qty 1

## 2017-06-25 MED ORDER — ARTIFICIAL TEARS OPHTHALMIC OINT
TOPICAL_OINTMENT | OPHTHALMIC | Status: AC
  Filled 2017-06-25: qty 3.5

## 2017-06-25 MED ORDER — INSULIN ASPART 100 UNIT/ML ~~LOC~~ SOLN
0.0000 [IU] | SUBCUTANEOUS | Status: DC
  Administered 2017-06-25: 11 [IU] via SUBCUTANEOUS

## 2017-06-25 MED ORDER — MENTHOL 3 MG MT LOZG: 1.0000 | LOZENGE | OROMUCOSAL | Status: DC | PRN

## 2017-06-25 MED ORDER — ONDANSETRON HCL 4 MG/2ML IJ SOLN: 4.0000 mg | Freq: Four times a day (QID) | INTRAMUSCULAR | Status: DC | PRN

## 2017-06-25 MED ORDER — QUETIAPINE FUMARATE 100 MG PO TABS
150.0000 mg | ORAL_TABLET | Freq: Every day | ORAL | Status: DC
  Administered 2017-06-25 – 2017-06-26 (×2): 150 mg via ORAL
  Filled 2017-06-25 (×2): qty 1

## 2017-06-25 MED ORDER — ONDANSETRON HCL 4 MG PO TABS: 4.0000 mg | ORAL_TABLET | Freq: Four times a day (QID) | ORAL | Status: DC | PRN

## 2017-06-25 MED ORDER — BACITRACIN ZINC 500 UNIT/GM EX OINT
TOPICAL_OINTMENT | CUTANEOUS | Status: DC | PRN
Start: 2017-06-25 — End: 2017-06-25
  Administered 2017-06-25: 1 via TOPICAL

## 2017-06-25 MED ORDER — BUPIVACAINE LIPOSOME 1.3 % IJ SUSP
20.0000 mL | INTRAMUSCULAR | Status: AC
  Administered 2017-06-25: 20 mL
  Filled 2017-06-25: qty 20

## 2017-06-25 MED ORDER — DEXAMETHASONE SODIUM PHOSPHATE 10 MG/ML IJ SOLN
INTRAMUSCULAR | Status: DC | PRN
  Administered 2017-06-25: 10 mg via INTRAVENOUS

## 2017-06-25 MED ORDER — DULOXETINE HCL 30 MG PO CPEP
60.0000 mg | ORAL_CAPSULE | Freq: Every day | ORAL | Status: DC
  Administered 2017-06-26: 60 mg via ORAL
  Filled 2017-06-25: qty 2

## 2017-06-25 MED ORDER — SUGAMMADEX SODIUM 200 MG/2ML IV SOLN
INTRAVENOUS | Status: AC
  Filled 2017-06-25: qty 2

## 2017-06-25 MED ORDER — METFORMIN HCL 500 MG PO TABS
1000.0000 mg | ORAL_TABLET | Freq: Two times a day (BID) | ORAL | Status: DC
  Administered 2017-06-25 – 2017-06-27 (×4): 1000 mg via ORAL
  Filled 2017-06-25 (×4): qty 2

## 2017-06-25 MED ORDER — 0.9 % SODIUM CHLORIDE (POUR BTL) OPTIME
TOPICAL | Status: DC | PRN
  Administered 2017-06-25: 1000 mL

## 2017-06-25 SURGICAL SUPPLY — 74 items
BAG DECANTER FOR FLEXI CONT (MISCELLANEOUS) ×3 IMPLANT
BENZOIN TINCTURE PRP APPL 2/3 (GAUZE/BANDAGES/DRESSINGS) ×3 IMPLANT
BLADE 10 SAFETY STRL DISP (BLADE) ×3 IMPLANT
BLADE CLIPPER SURG (BLADE) IMPLANT
BUR MATCHSTICK NEURO 3.0 LAGG (BURR) ×3 IMPLANT
BUR PRECISION FLUTE 6.0 (BURR) ×3 IMPLANT
CAGE ALTERA 10X31X9-13 15D (Cage) ×2 IMPLANT
CAGE ALTERA 9-13-15-31MM (Cage) ×1 IMPLANT
CANISTER SUCT 3000ML PPV (MISCELLANEOUS) ×3 IMPLANT
CAP REVERE LOCKING (Cap) ×12 IMPLANT
CARTRIDGE OIL MAESTRO DRILL (MISCELLANEOUS) ×1 IMPLANT
CLOSURE WOUND 1/2 X4 (GAUZE/BANDAGES/DRESSINGS) ×1
CONT SPEC 4OZ CLIKSEAL STRL BL (MISCELLANEOUS) ×3 IMPLANT
COVER BACK TABLE 60X90IN (DRAPES) ×6 IMPLANT
DIFFUSER DRILL AIR PNEUMATIC (MISCELLANEOUS) ×3 IMPLANT
DRAIN JACKSON PRATT 10MM FLAT (MISCELLANEOUS) ×3 IMPLANT
DRAPE C-ARM 42X72 X-RAY (DRAPES) ×6 IMPLANT
DRAPE HALF SHEET 40X57 (DRAPES) ×3 IMPLANT
DRAPE LAPAROTOMY 100X72X124 (DRAPES) ×3 IMPLANT
DRAPE POUCH INSTRU U-SHP 10X18 (DRAPES) ×3 IMPLANT
DRAPE SURG 17X23 STRL (DRAPES) ×12 IMPLANT
ELECT BLADE 4.0 EZ CLEAN MEGAD (MISCELLANEOUS) ×6
ELECT REM PT RETURN 9FT ADLT (ELECTROSURGICAL) ×3
ELECTRODE BLDE 4.0 EZ CLN MEGD (MISCELLANEOUS) ×2 IMPLANT
ELECTRODE REM PT RTRN 9FT ADLT (ELECTROSURGICAL) ×1 IMPLANT
EVACUATOR 1/8 PVC DRAIN (DRAIN) IMPLANT
EVACUATOR SILICONE 100CC (DRAIN) ×3 IMPLANT
GAUZE SPONGE 4X4 12PLY STRL (GAUZE/BANDAGES/DRESSINGS) ×3 IMPLANT
GAUZE SPONGE 4X4 16PLY XRAY LF (GAUZE/BANDAGES/DRESSINGS) ×3 IMPLANT
GLOVE BIO SURGEON STRL SZ8 (GLOVE) ×6 IMPLANT
GLOVE BIO SURGEON STRL SZ8.5 (GLOVE) ×6 IMPLANT
GLOVE BIOGEL PI IND STRL 6.5 (GLOVE) ×3 IMPLANT
GLOVE BIOGEL PI IND STRL 7.0 (GLOVE) ×2 IMPLANT
GLOVE BIOGEL PI IND STRL 7.5 (GLOVE) ×1 IMPLANT
GLOVE BIOGEL PI INDICATOR 6.5 (GLOVE) ×6
GLOVE BIOGEL PI INDICATOR 7.0 (GLOVE) ×4
GLOVE BIOGEL PI INDICATOR 7.5 (GLOVE) ×2
GLOVE ECLIPSE 7.0 STRL STRAW (GLOVE) ×3 IMPLANT
GLOVE EXAM NITRILE LRG STRL (GLOVE) IMPLANT
GLOVE EXAM NITRILE XL STR (GLOVE) IMPLANT
GLOVE EXAM NITRILE XS STR PU (GLOVE) IMPLANT
GLOVE SURG SS PI 6.5 STRL IVOR (GLOVE) ×15 IMPLANT
GOWN STRL REUS W/ TWL LRG LVL3 (GOWN DISPOSABLE) ×5 IMPLANT
GOWN STRL REUS W/ TWL XL LVL3 (GOWN DISPOSABLE) ×2 IMPLANT
GOWN STRL REUS W/TWL 2XL LVL3 (GOWN DISPOSABLE) IMPLANT
GOWN STRL REUS W/TWL LRG LVL3 (GOWN DISPOSABLE) ×10
GOWN STRL REUS W/TWL XL LVL3 (GOWN DISPOSABLE) ×4
HEMOSTAT POWDER KIT SURGIFOAM (HEMOSTASIS) ×3 IMPLANT
KIT BASIN OR (CUSTOM PROCEDURE TRAY) ×3 IMPLANT
KIT ROOM TURNOVER OR (KITS) ×3 IMPLANT
NEEDLE HYPO 21X1.5 SAFETY (NEEDLE) ×3 IMPLANT
NEEDLE HYPO 22GX1.5 SAFETY (NEEDLE) ×3 IMPLANT
NS IRRIG 1000ML POUR BTL (IV SOLUTION) ×3 IMPLANT
OIL CARTRIDGE MAESTRO DRILL (MISCELLANEOUS) ×3
PACK LAMINECTOMY NEURO (CUSTOM PROCEDURE TRAY) ×3 IMPLANT
PAD ARMBOARD 7.5X6 YLW CONV (MISCELLANEOUS) ×18 IMPLANT
PATTIES SURGICAL .5 X1 (DISPOSABLE) IMPLANT
ROD REVERE 6.35 40MM (Rod) ×6 IMPLANT
SCREW REVERE 6.35 6.5X55MM (Screw) ×6 IMPLANT
SCREW REVERE 6.5X50MM (Screw) ×6 IMPLANT
SPONGE LAP 4X18 X RAY DECT (DISPOSABLE) IMPLANT
SPONGE NEURO XRAY DETECT 1X3 (DISPOSABLE) IMPLANT
SPONGE SURGIFOAM ABS GEL 100 (HEMOSTASIS) ×3 IMPLANT
STRIP BIOACTIVE 20CC 25X100X8 (Miscellaneous) ×3 IMPLANT
STRIP CLOSURE SKIN 1/2X4 (GAUZE/BANDAGES/DRESSINGS) ×2 IMPLANT
SUT VIC AB 1 CT1 18XBRD ANBCTR (SUTURE) ×2 IMPLANT
SUT VIC AB 1 CT1 8-18 (SUTURE) ×4
SUT VIC AB 2-0 CP2 18 (SUTURE) ×6 IMPLANT
SYR 20CC LL (SYRINGE) ×3 IMPLANT
TAPE CLOTH SURG 4X10 WHT LF (GAUZE/BANDAGES/DRESSINGS) ×3 IMPLANT
TOWEL GREEN STERILE (TOWEL DISPOSABLE) ×3 IMPLANT
TOWEL GREEN STERILE FF (TOWEL DISPOSABLE) ×3 IMPLANT
TRAY FOLEY W/METER SILVER 16FR (SET/KITS/TRAYS/PACK) ×3 IMPLANT
WATER STERILE IRR 1000ML POUR (IV SOLUTION) ×3 IMPLANT

## 2017-06-25 NOTE — Anesthesia Procedure Notes (Signed)
Procedure Name: Intubation Date/Time: 06/25/2017 9:48 AM Performed by: Teressa Lower., CRNA Pre-anesthesia Checklist: Patient identified, Emergency Drugs available, Suction available and Patient being monitored Patient Re-evaluated:Patient Re-evaluated prior to induction Oxygen Delivery Method: Circle system utilized Preoxygenation: Pre-oxygenation with 100% oxygen Induction Type: IV induction and Cricoid Pressure applied Ventilation: Mask ventilation without difficulty Laryngoscope Size: Mac and 4 Grade View: Grade II Tube type: Oral Tube size: 7.5 mm Number of attempts: 1 Airway Equipment and Method: Stylet and Oral airway Placement Confirmation: ETT inserted through vocal cords under direct vision,  positive ETCO2 and breath sounds checked- equal and bilateral Secured at: 23 cm Tube secured with: Tape Dental Injury: Teeth and Oropharynx as per pre-operative assessment

## 2017-06-25 NOTE — Progress Notes (Signed)
Subjective: The patient is alert and pleasant.  He is in no apparent distress.  He looks well.  Objective: Vital signs in last 24 hours: Temp:  [98.4 F (36.9 C)-98.8 F (37.1 C)] 98.4 F (36.9 C) (01/03 1347) Pulse Rate:  [60-128] 128 (01/03 1359) Resp:  [15-19] 15 (01/03 1359) BP: (131-145)/(57-93) 145/93 (01/03 1359) SpO2:  [94 %-100 %] 100 % (01/03 1359)  Intake/Output from previous day: No intake/output data recorded. Intake/Output this shift: Total I/O In: 2250 [I.V.:2000; IV Piggyback:250] Out: 550 [Urine:230; Drains:50; Blood:270]  Physical exam the patient is alert and pleasant.  He is moving his lower extremities well.  Lab Results: No results for input(s): WBC, HGB, HCT, PLT in the last 72 hours. BMET No results for input(s): NA, K, CL, CO2, GLUCOSE, BUN, CREATININE, CALCIUM in the last 72 hours.  Studies/Results: Dg Lumbar Spine 2-3 Views  Result Date: 06/25/2017 CLINICAL DATA:  PLIF of L4-5. FLUOROSCOPY TIME:  29 seconds. Images: 2 EXAM: LUMBAR SPINE - 2-3 VIEW COMPARISON:  None. FINDINGS: Images were obtained during pedicle screw placements at L4 and L5. A disc spacer device is placed at the same level. IMPRESSION: PLIF of L4-5. Electronically Signed   By: Dorise Bullion III M.D   On: 06/25/2017 13:26   Dg Lumbar Spine 1 View  Result Date: 06/25/2017 CLINICAL DATA:  L4-5 posterior fixation. EXAM: LUMBAR SPINE - 1 VIEW COMPARISON:  05/22/2017 MRI FINDINGS: Single lateral view labeled 1026 hours. This demonstrates a surgical pointer projecting posterior to the lumbosacral junction. Soft tissue expanders project posterior to the L4 and L5 vertebral bodies. Degenerate disc disease at multiple levels. IMPRESSION: Intraoperative localization of L4, L5, and the lumbosacral junction. Electronically Signed   By: Abigail Miyamoto M.D.   On: 06/25/2017 11:56   Dg C-arm 1-60 Min  Result Date: 06/25/2017 CLINICAL DATA:  Lumbar spine fusion. EXAM: DG C-ARM 61-120 MIN COMPARISON:   06/25/2017. FINDINGS: Posterior and interbody L4-L5 fusion. Hardware intact. Anatomic alignment . IMPRESSION: Posterior and interbody L4-L5 fusion.  Anatomic alignment . Electronically Signed   By: Marcello Moores  Register   On: 06/25/2017 13:28    Assessment/Plan: The patient is doing well.  I spoke with his family.  LOS: 0 days     [name] 06/25/2017, 2:29 PM

## 2017-06-25 NOTE — Op Note (Signed)
Brief history: The patient is a 72 year old white male who has complained of back, buttock and leg pain consistent with neurogenic claudication.  He has failed medical management and was worked up with a lumbar MRI and lumbar x-rays.  This demonstrated multilevel lumbar spinal stenosis with a L4-5 spondylolisthesis.  I discussed the various treatment options with the patient and his wife.  He has weighed the risks, benefits, and alternatives of surgery and decided to proceed with a lumbar laminectomy with an L4-5 instrumentation and fusion.  Preoperative diagnosis: L2-3, L3-4 and L4-5 spinal stenosis, L4-5 spondylolisthesis, degenerative disc disease, spinal stenosis compressing both the L3, L4 and L5  nerve roots; lumbago; lumbar radiculopathy; neurogenic claudication  Postoperative diagnosis: Same  Procedure: Bilateral L2-3 laminotomy/foraminotomies, bilateral L3-4 and L4-5 laminectomy/foraminotomies to decompress the bilateral L3, L4 and L5 nerve roots(the work required to do this was in addition to the work required to do the posterior lumbar interbody fusion because of the patient's spinal stenosis, facet arthropathy. Etc. requiring a wide decompression of the nerve roots.);  L4-5 transforaminal lumbar interbody fusion with local morselized autograft bone and Kinnex graft extender; insertion of interbody prosthesis at L4-5 (globus peek expandable interbody prosthesis); posterior nonsegmental instrumentation from L4 to L5 with globus titanium pedicle screws and rods; posterior lateral arthrodesis at L4-5 with local morselized autograft bone and Kinnex bone graft extender.  Surgeon: Dr. Earle Gell  Asst.: Dr. Kathyrn Sheriff  Anesthesia: Gen. endotracheal  Estimated blood loss: 250 cc  Drains: one 10 mm flat Jackson-Pratt drain in the epidural space  Complications: None  Description of procedure: The patient was brought to the operating room by the anesthesia team. General endotracheal anesthesia  was induced. The patient was turned to the prone position on the Wilson frame. The patient's lumbosacral region was then prepared with Betadine scrub and Betadine solution. Sterile drapes were applied.  I then injected the area to be incised with Marcaine with epinephrine solution. I then used the scalpel to make a linear midline incision over the L2-3, L3-4 and L4-5 interspace. I then used electrocautery to perform a bilateral subperiosteal dissection exposing the spinous process and lamina of 2 to L5. We then obtained intraoperative radiograph to confirm our location. We then inserted the Verstrac retractor to provide exposure.  I incised the interspinous ligament at L2-3, L3-4 and L4-5 with a scalpel.  I used a Leksell rongeur to remove the L3 and L4 spinous process.  I began the decompression by using the high speed drill to perform laminotomies at L2-3, L3-4 and L4-5 bilaterally. We then used the Kerrison punches to complete the L3-4 and L4-5 laminectomy and to widen the laminotomies bilaterally at L2-3.  We removed the ligamentum flavum at L2-3, L3-4 and L4-5. We used the Kerrison punches to remove the medial facets at L2-3, L3-4 and L4-5. We performed wide foraminotomies about the bilateral L3, L4 and L5 nerve roots completing the decompression.  We now turned our attention to the posterior lumbar interbody fusion. I used a scalpel to incise the intervertebral disc at L4-5 bilaterally. I then performed a partial intervertebral discectomy at L4-5 bilaterally using the pituitary forceps. We prepared the vertebral endplates at N0-0 bilaterally for the fusion by removing the soft tissues with the curettes. We then used the trial spacers to pick the appropriate sized interbody prosthesis. We prefilled his prosthesis with a combination of local morselized autograft bone that we obtained during the decompression as well as Kinnex bone graft extender. We inserted the prefilled prosthesis  into the interspace at  L4-5 from the right, we then expanded the prosthesis. There was a good snug fit of the prosthesis in the interspace. We then filled and the remainder of the intervertebral disc space with local morselized autograft bone and Kinnex. This completed the posterior lumbar interbody arthrodesis.  We now turned attention to the instrumentation. Under fluoroscopic guidance we cannulated the bilateral L4 and L5 pedicles with the bone probe. We then removed the bone probe. We then tapped the pedicle with a 5.5 millimeter tap. We then removed the tap. We probed inside the tapped pedicle with a ball probe to rule out cortical breaches. We then inserted a 6.5 x 50 and 55 millimeter pedicle screw into the L4 and L5 pedicles bilaterally under fluoroscopic guidance. We then palpated along the medial aspect of the pedicles to rule out cortical breaches. There were none. The nerve roots were not injured. We then connected the unilateral pedicle screws with a lordotic rod. We compressed the construct and secured the rod in place with the caps. We then tightened the caps appropriately. This completed the instrumentation from L4-5.  We now turned our attention to the posterior lateral arthrodesis at L4-5 bilaterally. We used the high-speed drill to decorticate the remainder of the facets, pars, transverse process at L4-5 bilaterally. We then applied a combination of local morselized autograft bone and Kinnex bone graft extender over these decorticated posterior lateral structures. This completed the posterior lateral arthrodesis.  We then obtained hemostasis using bipolar electrocautery. We irrigated the wound out with bacitracin solution. We inspected the thecal sac and nerve roots and noted they were well decompressed. We then removed the retractor. We placed vancomycin powder in the wound.  We placed a 10 mm flat Jackson-Pratt drain in the epidural space and tunneled it out through a separate stab wound.  We reapproximated  patient's thoracolumbar fascia with interrupted #1 Vicryl suture. We reapproximated patient's subcutaneous tissue with interrupted 2-0 Vicryl suture. The reapproximated patient's skin with Steri-Strips and benzoin. The wound was then coated with bacitracin ointment. A sterile dressing was applied. The drapes were removed. The patient was subsequently returned to the supine position where they were extubated by the anesthesia team. He was then transported to the post anesthesia care unit in stable condition. All sponge instrument and needle counts were reportedly correct at the end of this case.

## 2017-06-25 NOTE — Anesthesia Procedure Notes (Signed)
Arterial Line Insertion Start/End1/08/2017 9:00 AM, 06/25/2017 9:15 AM Performed by: Teressa Lower., CRNA, CRNA  Patient location: Pre-op. Preanesthetic checklist: patient identified, IV checked, site marked, risks and benefits discussed, surgical consent, monitors and equipment checked, pre-op evaluation, timeout performed and anesthesia consent Lidocaine 1% used for infiltration Right, radial was placed Catheter size: 20 G Hand hygiene performed , maximum sterile barriers used  and Seldinger technique used Allen's test indicative of satisfactory collateral circulation Attempts: 2 Procedure performed without using ultrasound guided technique. Following insertion, dressing applied and Biopatch. Post procedure assessment: normal and unchanged  Patient tolerated the procedure well with no immediate complications.

## 2017-06-25 NOTE — Anesthesia Preprocedure Evaluation (Addendum)
Anesthesia Evaluation  Patient identified by MRN, date of birth, ID band Patient awake    Reviewed: Allergy & Precautions, H&P , NPO status , Patient's Chart, lab work & pertinent test results  Airway Mallampati: II   Neck ROM: full    Dental   Pulmonary shortness of breath, sleep apnea , former smoker,  H/o lung CA.  S/p pneumonectomy   breath sounds clear to auscultation       Cardiovascular hypertension, + CAD, + Past MI and +CHF  + dysrhythmias Atrial Fibrillation  Rhythm:regular Rate:Normal     Neuro/Psych Anxiety Depression  Neuromuscular disease CVA    GI/Hepatic GERD  ,  Endo/Other  diabetes, Type 2Hypothyroidism Morbid obesity  Renal/GU      Musculoskeletal  (+) Arthritis ,   Abdominal   Peds  Hematology   Anesthesia Other Findings   Reproductive/Obstetrics                            Anesthesia Physical Anesthesia Plan  ASA: III  Anesthesia Plan: General   Post-op Pain Management:    Induction: Intravenous  PONV Risk Score and Plan: 2 and Ondansetron, Dexamethasone and Treatment may vary due to age or medical condition  Airway Management Planned: Oral ETT  Additional Equipment: Arterial line  Intra-op Plan:   Post-operative Plan: Extubation in OR  Informed Consent: I have reviewed the patients History and Physical, chart, labs and discussed the procedure including the risks, benefits and alternatives for the proposed anesthesia with the patient or authorized representative who has indicated his/her understanding and acceptance.     Plan Discussed with: CRNA, Anesthesiologist and Surgeon  Anesthesia Plan Comments:         Anesthesia Quick Evaluation

## 2017-06-25 NOTE — H&P (Signed)
Subjective: The patient is a 72 year old white male who has complained of back, buttock and leg pain consistent with neurogenic claudication.  He has failed medical management and was worked up with a lumbar MRI.  This demonstrated the patient had multilevel stenosis with a spondylolisthesis at L4-5.  I discussed the various treatment options with the patient including surgery.  He has weighed the risks, benefits, and alternatives of surgery and decided proceed with a lumbar laminectomy, instrumentation and fusion.  Past Medical History:  Diagnosis Date  . A-fib ([place])   . Anxiety   . Arthritis    previous to joint replacement - knees & shoulder.  Pt.'s R foot fused, traumatized from exposure to agent ORANGE  . Cancer ([place])    lung  . CHF (congestive heart failure) ([place])   . Coronary artery disease   . Depression   . Diabetes mellitus without complication ([place])    diagnosed- 34yrs.   Marland Kitchen Dyspnea   . Dysrhythmia   . GERD (gastroesophageal reflux disease)   . Hyperlipidemia   . Hypertension   . Neuromuscular disorder ([facility])    neuropathy- feet & legs  . Pneumonia    several times, but treated from his home, not hosp.   . Sleep apnea    not able to CPAP, since lung beiing removed- wife reports no problem.   . Stroke (cerebrum) [facility])    as a 72 y.o.     Past Surgical History:  Procedure Laterality Date  . ANKLE FUSION    . CARDIAC CATHETERIZATION    . EYE SURGERY Left    cataract removed   . JOINT REPLACEMENT Left 2016   shoulder replacement   . LUNG LOBECTOMY Left 2016   done at [place]    . SKIN CANCER EXCISION      No Known Allergies  Social History   Tobacco Use  . Smoking status: Former Smoker    Last attempt to quit: 06/18/1994    Years since quitting: 23.0  . Smokeless tobacco: Never Used  Substance Use Topics  . Alcohol use: No    Family History  Problem Relation Age of Onset  . Depression Mother   . Heart attack Father    . Heart disease Father   . Heart failure Father   . Hyperlipidemia Father   . Hypertension Father   . Osteoarthritis Father   . Rheum arthritis Father   . Early death Father   . Early death Brother   . Hypertension Brother   . Hyperlipidemia Brother   . Heart failure Brother   . Heart disease Brother   . Heart attack Brother   . Early death Maternal Uncle   . Stroke Maternal Uncle   . Heart attack Paternal Uncle   . Early death Maternal Grandmother   . Stroke Maternal Grandmother    Prior to Admission medications   Medication Sig Start Date End Date Taking? Authorizing Provider  aspirin EC 81 MG tablet Take 81 mg by mouth daily.   Yes [provider]  atorvastatin (LIPITOR) 40 MG tablet Take 20 mg by mouth daily.   Yes [provider]  celecoxib (CELEBREX) 200 MG capsule Take 200 mg by mouth 2 (two) times daily.   Yes [provider]  Cholecalciferol (VITAMIN D) 2000 units tablet Take 2,000 Units by mouth daily.  11/28/10  Yes [provider]  digoxin (LANOXIN) 0.125 MG tablet Take 0.125 mg by mouth  at bedtime.    Yes [provider]  DULoxetine (CYMBALTA) 60 MG capsule Take 60 mg by mouth daily.   Yes [provider]  furosemide (LASIX) 80 MG tablet Take 80 mg by mouth 3 (three) times a week.    Yes [provider]  gabapentin (NEURONTIN) 400 MG capsule Take 400-800 mg by mouth See admin instructions. Take 400 mg by mouth in the morning and take 800 mg by mouth at bedtime   Yes [provider]  glipiZIDE (GLUCOTROL) 10 MG tablet Take 10 mg by mouth 2 (two) times daily.   Yes [provider]  HYDROcodone-acetaminophen (NORCO/VICODIN) 5-325 MG tablet Take 1 tablet by mouth 2 (two) times daily as needed for moderate pain.  06/04/17  Yes [provider]  lisinopril (PRINIVIL,ZESTRIL) 20 MG tablet Take 20 mg by mouth daily.   Yes [provider]  magnesium oxide (MAG-OX) 400 MG tablet Take  400 mg by mouth daily.   Yes [provider]  metFORMIN (GLUCOPHAGE) 1000 MG tablet Take 1,000 mg by mouth 2 (two) times daily. 06/20/16 07/21/17 Yes [provider]  metoprolol succinate (TOPROL-XL) 25 MG 24 hr tablet Take 25 mg by mouth daily.   Yes [provider]  omeprazole (PRILOSEC) 20 MG capsule Take 20 mg by mouth 2 (two) times daily.   Yes [provider]  QUEtiapine (SEROQUEL) 100 MG tablet Take 150 mg by mouth at bedtime.    Yes [provider]  solifenacin (VESICARE) 5 MG tablet Take 5 mg by mouth daily.    Yes [provider]  tamsulosin (FLOMAX) 0.4 MG CAPS capsule Take 0.4 mg by mouth at bedtime.  02/02/16  Yes [provider]  warfarin (COUMADIN) 5 MG tablet Take 7.5 mg by mouth daily 11/13/16  Yes [provider]  clopidogrel (PLAVIX) 75 MG tablet Take 1 tablet (75 mg total) by mouth daily with breakfast. 06/22/17   [place] Liter, MD     Review of Systems  Positive ROS: As above  All other systems have been reviewed and were otherwise negative with the exception of those mentioned in the HPI and as above.  Objective: Vital signs in last 24 hours: Temp:  [98.8 F (37.1 C)] 98.8 F (37.1 C) (01/03 0828) Pulse Rate:  [60] 60 (01/03 0828) Resp:  [19] 19 (01/03 0828) BP: (137)/(57) 137/57 (01/03 0828) SpO2:  [94 %] 94 % (01/03 0828)  General Appearance: Alert Head: Normocephalic, without obvious abnormality, atraumatic Eyes: PERRL, conjunctiva/corneas clear, EOM's intact,    Ears: Normal  Throat: Normal  Neck: Supple, Back: unremarkable Lungs: Clear to auscultation bilaterally, respirations unlabored Heart: Regular rate and rhythm, no murmur, rub or gallop Abdomen: Soft, non-tender Extremities: Extremities normal, atraumatic, no cyanosis or edema Skin: unremarkable  NEUROLOGIC:   Mental status: alert and oriented,Motor Exam - grossly normal Sensory Exam - grossly normal Reflexes:   Coordination - grossly normal Gait - grossly normal Balance - grossly normal Cranial Nerves: I: smell Not tested  II: visual acuity  OS: Normal  OD: Normal   II: visual fields Full to confrontation  II: pupils Equal, round, reactive to light  III,VII: ptosis None  III,IV,VI: extraocular muscles  Full ROM  V: mastication Normal  V: facial light touch sensation  Normal  V,VII: corneal reflex  Present  VII: facial muscle function - upper  Normal  VII: facial muscle function - lower Normal  VIII: hearing Not tested  IX: soft palate elevation  Normal  IX,X: gag reflex Present  XI: trapezius strength  5/5  XI: sternocleidomastoid strength 5/5  XI: neck flexion strength  5/5  XII: tongue strength  Normal    Data Review Lab Results  Component Value Date   WBC 9.3 06/19/2017   HGB 16.5 06/19/2017   HCT 47.1 06/19/2017   MCV 86.6 06/19/2017   PLT 224 06/19/2017   Lab Results  Component Value Date   NA 137 06/19/2017   K 4.0 06/19/2017   CL 101 06/19/2017   CO2 26 06/19/2017   BUN 10 06/19/2017   CREATININE 0.91 06/19/2017   GLUCOSE 149 (H) 06/19/2017   Lab Results  Component Value Date   INR 1.00 06/25/2017    Assessment/Plan: L2-3, L3-4 and L4-5 spinal stenosis, L4-5 spondylolisthesis, lumbago, lumbar radiculopathy, neurogenic claudication: I have discussed the situation with the patient and his wife.  I have reviewed the imaging studies with him and pointed out the abnormalities.  We have discussed the various treatment options including surgery.  I have described the surgical treatment option of an L2-3, L3-4 and L4-5 laminectomy with instrumentation and fusion at L4-5.  I have shown them surgical models.  I have given him a surgical pamphlet.  We have discussed the risks, benefits, alternatives, expected postoperative course, and likelihood of achieving our goals with surgery.  The patient has decided proceed with surgery  Anticoagulation, cardiac issues: The patient  has stopped his Coumadin and Plavix 6 days ago.   [name] 06/25/2017 9:14 AM

## 2017-06-25 NOTE — Progress Notes (Signed)
Orthopedic Tech Progress Note Patient Details:  Travis Watts 12-19-1945 [id number]  Patient ID: Travis Watts, male   DOB: 06-Feb-1946, 72 y.o.   MRN: [id number]   Travis Watts 06/25/2017, 2:22 PMPt has brace.

## 2017-06-25 NOTE — Evaluation (Signed)
Physical Therapy Evaluation Patient Details Name: Travis Watts MRN: [id number] DOB: 1945-10-16 Today's Date: 06/25/2017   History of Present Illness  Pt is a 72 y/o male s/p L4-5 PLIF. PMH includes a fib, lung cancer, HTN, CVA, OSA, bilat TKA, and L shoulder replacement.   Clinical Impression  Patient is s/p above surgery resulting in the deficits listed below (see PT Problem List). PTA, pt was independent with functional mobility. Upon eval, pt presenting with pain in his back and L hip, and with decreased balance. REquired min guard to min A for mobility tasks this session. Educated about use of RW at home to increase stability. Reports wife will be able to assist as needed upon d/c and has all necessary DME. Patient will benefit from skilled PT to increase their independence and safety with mobility (while adhering to their precautions) to allow discharge to the venue listed below. Will continue to follow acutely to maximize functional mobility independence and safety.      Follow Up Recommendations No PT follow up;Supervision for mobility/OOB    Equipment Recommendations  None recommended by PT    Recommendations for Other Services       Precautions / Restrictions Precautions Precautions: Back Precaution Booklet Issued: Yes (comment) Precaution Comments: REviewed back precautions with pt  Required Braces or Orthoses: Spinal Brace Spinal Brace: Lumbar corset;Applied in sitting position Restrictions Weight Bearing Restrictions: No      Mobility  Bed Mobility Overal bed mobility: Needs Assistance Bed Mobility: Rolling;Sidelying to Sit Rolling: Min assist Sidelying to sit: Min assist       General bed mobility comments: Min A for rolling and trunk elevation. Verbal cues to use log roll technique.   Transfers Overall transfer level: Needs assistance Equipment used: 1 person hand held assist Transfers: Sit to/from Stand Sit to Stand: Min assist         General  transfer comment: Min A for lift assist and steadying assist. Verbal cues to power through LEs.   Ambulation/Gait Ambulation/Gait assistance: Min assist;Min guard Ambulation Distance (Feet): 100 Feet Assistive device: 1 person hand held assist(IV pole ) Gait Pattern/deviations: Step-through pattern;Decreased stride length Gait velocity: Decreased  Gait velocity interpretation: Below normal speed for age/gender General Gait Details: Slow, unsteady, guarded gait. Required use of HHA and IV pole and min to min guard assist for steadying. Educated about use of RW at home to increase stability. Educated about generalized walking program to perform at home.   Stairs            Wheelchair Mobility    Modified Rankin (Stroke Patients Only)       Balance Overall balance assessment: Needs assistance Sitting-balance support: No upper extremity supported;Feet supported Sitting balance-Leahy Scale: Good     Standing balance support: Bilateral upper extremity supported;During functional activity Standing balance-Leahy Scale: Poor Standing balance comment: Reliant on UE support.                              Pertinent Vitals/Pain Pain Assessment: Faces Faces Pain Scale: Hurts even more Pain Location: L hip and back  Pain Descriptors / Indicators: Aching;Operative site guarding Pain Intervention(s): Limited activity within patient's tolerance;Monitored during session;Repositioned    Home Living Family/patient expects to be discharged to:: Private residence Living Arrangements: Spouse/significant other Available Help at Discharge: Family;Available 24 hours/day Type of Home: House Home Access: Stairs to enter Entrance Stairs-Rails: Psychiatric nurse of Steps: 3 Home Layout: One  level Home Equipment: Walker - 2 wheels;Cane - single point;Bedside commode;Shower seat      Prior Function Level of Independence: Independent               Hand  Dominance        Extremity/Trunk Assessment   Upper Extremity Assessment Upper Extremity Assessment: Defer to OT evaluation    Lower Extremity Assessment Lower Extremity Assessment: LLE deficits/detail;Generalized weakness LLE Deficits / Details: L hip pain.     Cervical / Trunk Assessment Cervical / Trunk Assessment: Normal  Communication   Communication: No difficulties  Cognition Arousal/Alertness: Awake/alert Behavior During Therapy: WFL for tasks assessed/performed Overall Cognitive Status: Within Functional Limits for tasks assessed                                        General Comments General comments (skin integrity, edema, etc.): Pt's wife and children present during session.     Exercises     Assessment/Plan    PT Assessment Patient needs continued PT services  PT Problem List Decreased strength;Decreased balance;Decreased mobility;Decreased knowledge of use of DME;Decreased knowledge of precautions;Pain       PT Treatment Interventions DME instruction;Gait training;Stair training;Functional mobility training;Therapeutic exercise;Therapeutic activities;Balance training;Neuromuscular re-education;Patient/family education    PT Goals (Current goals can be found in the Care Plan section)  Acute Rehab PT Goals Patient Stated Goal: to go home  PT Goal Formulation: With patient Time For Goal Achievement: 07/09/17 Potential to Achieve Goals: Good    Frequency Min 5X/week   Barriers to discharge        Co-evaluation               AM-PAC PT "6 Clicks" Daily Activity  Outcome Measure Difficulty turning over in bed (including adjusting bedclothes, sheets and blankets)?: Unable Difficulty moving from lying on back to sitting on the side of the bed? : Unable Difficulty sitting down on and standing up from a chair with arms (e.g., wheelchair, bedside commode, etc,.)?: Unable Help needed moving to and from a bed to chair (including a  wheelchair)?: A Little Help needed walking in hospital room?: A Little Help needed climbing 3-5 steps with a railing? : A Lot 6 Click Score: 11    End of Session Equipment Utilized During Treatment: Gait belt;Back brace Activity Tolerance: Patient tolerated treatment well Patient left: in chair;with call bell/phone within reach;with family/visitor present Nurse Communication: Mobility status PT Visit Diagnosis: Unsteadiness on feet (R26.81);Other abnormalities of gait and mobility (R26.89);Pain Pain - Right/Left: Left Pain - part of body: Hip(back )    Time: 1287-8676 PT Time Calculation (min) (ACUTE ONLY): 23 min   Charges:   PT Evaluation $PT Eval Low Complexity: 1 Low PT Treatments $Gait Training: 8-22 mins   PT G Codes:        Leighton Ruff, PT, DPT  Acute Rehabilitation Services  Pager: [phone]   Rudean Hitt 06/25/2017, 6:09 PM

## 2017-06-25 NOTE — Transfer of Care (Signed)
Immediate Anesthesia Transfer of Care Note  Patient: Travis Watts  Procedure(s) Performed: POSTERIOR LUMBAR INTERBODY FUSION, INTERBODY PROSTHESIS, POSTERIOR INSTRUMENTATION LUMBAR FOUR- LUMBAR FIVE;; LAMINECTIMY/LAMINOTOMY LUMBAR TWO- LUMBAR THREE, LUMBAR THREE- LUMBAR FOUR (N/A Spine Lumbar)  Patient Location: PACU  Anesthesia Type:General  Level of Consciousness: drowsy  Airway & Oxygen Therapy: Patient Spontanous Breathing and Patient connected to face mask oxygen  Post-op Assessment: Report given to RN and Post -op Vital signs reviewed and stable  Post vital signs: Reviewed and stable  Last Vitals:  Vitals:   06/25/17 0828  BP: (!) 137/57  Pulse: 60  Resp: 19  Temp: 37.1 C  SpO2: 94%    Last Pain:  Vitals:   06/25/17 0828  TempSrc: Oral      Patients Stated Pain Goal: 5 (17/98/10 2548)  Complications: No apparent anesthesia complications

## 2017-06-26 LAB — GLUCOSE, CAPILLARY
GLUCOSE-CAPILLARY: 186 mg/dL — AB (ref 65–99)
Glucose-Capillary: 179 mg/dL — ABNORMAL HIGH (ref 65–99)
Glucose-Capillary: 164 mg/dL — ABNORMAL HIGH (ref 65–99)
Glucose-Capillary: 181 mg/dL — ABNORMAL HIGH (ref 65–99)

## 2017-06-26 LAB — BASIC METABOLIC PANEL
ANION GAP: 9 (ref 5–15)
BUN: 14 mg/dL (ref 6–20)
CALCIUM: 8.8 mg/dL — AB (ref 8.9–10.3)
CO2: 27 mmol/L (ref 22–32)
CREATININE: 1.29 mg/dL — AB (ref 0.61–1.24)
Chloride: 100 mmol/L — ABNORMAL LOW (ref 101–111)
GFR, EST NON AFRICAN AMERICAN: 55 mL/min — AB (ref >60)
Glucose, Bld: 198 mg/dL — ABNORMAL HIGH (ref 65–99)
Potassium: 3.7 mmol/L (ref 3.5–5.1)
SODIUM: 136 mmol/L (ref 135–145)

## 2017-06-26 LAB — CBC
HEMATOCRIT: 37.8 % — AB (ref 39.0–52.0)
Hemoglobin: 12.3 g/dL — ABNORMAL LOW (ref 13.0–17.0)
MCH: 28.9 pg (ref 26.0–34.0)
MCHC: 32.5 g/dL (ref 30.0–36.0)
MCV: 88.7 fL (ref 78.0–100.0)
PLATELETS: 205 10*3/uL (ref 150–400)
RBC: 4.26 MIL/uL (ref 4.22–5.81)
RDW: 14.9 % (ref 11.5–15.5)
WBC: 12.2 10*3/uL — ABNORMAL HIGH (ref 4.0–10.5)

## 2017-06-26 MED ORDER — OXYCODONE-ACETAMINOPHEN 5-325 MG PO TABS
1.0000 | ORAL_TABLET | ORAL | Status: DC | PRN
  Administered 2017-06-26 – 2017-06-27 (×4): 2 via ORAL
  Filled 2017-06-26 (×4): qty 2

## 2017-06-26 MED ORDER — METHOCARBAMOL 750 MG PO TABS
750.0000 mg | ORAL_TABLET | Freq: Four times a day (QID) | ORAL | Status: DC | PRN
  Administered 2017-06-26 – 2017-06-27 (×2): 750 mg via ORAL
  Filled 2017-06-26 (×2): qty 1

## 2017-06-26 MED FILL — Heparin Sodium (Porcine) Inj 1000 Unit/ML: INTRAMUSCULAR | Qty: 30 | Status: AC

## 2017-06-26 MED FILL — Sodium Chloride IV Soln 0.9%: INTRAVENOUS | Qty: 1000 | Status: AC

## 2017-06-26 NOTE — Progress Notes (Signed)
Physical Therapy Treatment Patient Details Name: Travis Watts MRN: [id number] DOB: [birth year] Today's Date: 06/26/2017    History of Present Illness Pt is a 72 y/o male s/p L4-5 PLIF. PMH includes a fib, lung cancer, HTN, CVA, OSA, bilat TKA, and L shoulder replacement.     PT Comments    Pt progressing towards physical therapy goals. Appeared to fatigue after stair training and required min assist for balance support and safety. Pt was educated on precautions, car transfer, brace application/wearing schedule, and general safety with activity progression. Will continue to follow.   Follow Up Recommendations  No PT follow up;Supervision for mobility/OOB     Equipment Recommendations  None recommended by PT    Recommendations for Other Services       Precautions / Restrictions Precautions Precautions: Back Precaution Booklet Issued: (present in room) Precaution Comments: Reviewed precautions with pt. He was able to recall 0/3 without cues.  Required Braces or Orthoses: Spinal Brace Spinal Brace: Lumbar corset;Applied in sitting position Restrictions Weight Bearing Restrictions: No    Mobility  Bed Mobility               General bed mobility comments: Pt sitting up on EOB when PT arrived. (Simultaneous filing. User may not have seen previous data.)  Transfers Overall transfer level: Needs assistance(Simultaneous filing. User may not have seen previous data.) Equipment used: None Transfers: Sit to/from Stand Sit to Stand: Min guard         General transfer comment: Hands-on guarding for safety as pt powered up to full standing position.  Ambulation/Gait Ambulation/Gait assistance: Min assist;Min guard Ambulation Distance (Feet): 400 Feet Assistive device: None Gait Pattern/deviations: Wide base of support;Trendelenburg Gait velocity: Decreased  Gait velocity interpretation: Below normal speed for age/gender General Gait Details: Slow and unsteady gait. Pt  required min guard assist initially however after stair training LE's appeared fatigued and pt required min assist for balance support.   Stairs Stairs: Yes   Stair Management: One rail Right;Step to pattern;Forwards;Sideways Number of Stairs: 10 General stair comments: Pt ascended forwards and descended sideways. Min assist provided for balance upon descent.   Wheelchair Mobility    Modified Rankin (Stroke Patients Only)       Balance Overall balance assessment: Needs assistance(Simultaneous filing. User may not have seen previous data.) Sitting-balance support: Feet supported;No upper extremity supported Sitting balance-Leahy Scale: Good     Standing balance support: No upper extremity supported;During functional activity Standing balance-Leahy Scale: Poor                              Cognition Arousal/Alertness: Awake/alert Behavior During Therapy: WFL for tasks assessed/performed Overall Cognitive Status: Within Functional Limits for tasks assessed                                        Exercises      General Comments        Pertinent Vitals/Pain Pain Assessment: Faces Faces Pain Scale: Hurts a little bit Pain Location: incision site Pain Descriptors / Indicators: Aching;Operative site guarding Pain Intervention(s): Limited activity within patient's tolerance;Monitored during session;Repositioned    Home Living                      Prior Function            PT  Goals (current goals can now be found in the care plan section) Acute Rehab PT Goals Patient Stated Goal: to go home  PT Goal Formulation: With patient/family Time For Goal Achievement: 07/09/17 Potential to Achieve Goals: Good Progress towards PT goals: Progressing toward goals    Frequency    Min 5X/week      PT Plan Current plan remains appropriate    Co-evaluation              AM-PAC PT "6 Clicks" Daily Activity  Outcome Measure   Difficulty turning over in bed (including adjusting bedclothes, sheets and blankets)?: A Little Difficulty moving from lying on back to sitting on the side of the bed? : A Little Difficulty sitting down on and standing up from a chair with arms (e.g., wheelchair, bedside commode, etc,.)?: A Little Help needed moving to and from a bed to chair (including a wheelchair)?: A Little Help needed walking in hospital room?: A Little Help needed climbing 3-5 steps with a railing? : A Little 6 Click Score: 18    End of Session Equipment Utilized During Treatment: Back brace Activity Tolerance: Patient tolerated treatment well Patient left: in chair;with call bell/phone within reach;with family/visitor present Nurse Communication: Mobility status PT Visit Diagnosis: Unsteadiness on feet (R26.81);Other abnormalities of gait and mobility (R26.89);Pain Pain - Right/Left: Left Pain - part of body: (back)     Time: 5625-6389 PT Time Calculation (min) (ACUTE ONLY): 21 min  Charges:  $Gait Training: 8-22 mins                    G Codes:       Rolinda Roan, PT, DPT Acute Rehabilitation Services Pager: [phone]    Thelma Comp 06/26/2017, 9:04 AM

## 2017-06-26 NOTE — Evaluation (Signed)
Occupational Therapy Evaluation Patient Details Name: Travis Watts MRN: [id number] DOB: 1945-08-24 Today's Date: 06/26/2017    History of Present Illness Pt is a 72 y/o male s/p L4-5 PLIF. PMH includes a fib, lung cancer, HTN, CVA, OSA, bilat TKA, and L shoulder replacement.    Clinical Impression   Pt reports he was independent with ADL PTA. Currently pt requires min guard assist for functional mobility, min assist for UB ADL in sitting, and mod assist for LB ADL. Began back, safety, and ADL education with pt and wife. Pt planning to d/c home with 24/7 supervision from family. Pt would benefit from continued skilled OT to address established goals.    Follow Up Recommendations  No OT follow up;Supervision/Assistance - 24 hour    Equipment Recommendations  None recommended by OT    Recommendations for Other Services       Precautions / Restrictions Precautions Precautions: Back Precaution Booklet Issued: No Precaution Comments: Reviewed precautions with pt and wife Required Braces or Orthoses: Spinal Brace Spinal Brace: Lumbar corset;Applied in sitting position Restrictions Weight Bearing Restrictions: No      Mobility Bed Mobility               General bed mobility comments: Pt OOB upon arrival  Transfers Overall transfer level: Needs assistance Equipment used: None Transfers: Sit to/from Stand Sit to Stand: Min guard         General transfer comment: for safety with sit to stand from EOB x1    Balance Overall balance assessment: Needs assistance Sitting-balance support: Feet supported;No upper extremity supported Sitting balance-Leahy Scale: Good     Standing balance support: No upper extremity supported;During functional activity Standing balance-Leahy Scale: Fair Standing balance comment: for static standing                           ADL either performed or assessed with clinical judgement   ADL Overall ADL's : Needs  assistance/impaired Eating/Feeding: Set up;Sitting   Grooming: Supervision/safety;Sitting Grooming Details (indicate cue type and reason): Educated on use of 2 cups for oral care Upper Body Bathing: Set up;Supervision/ safety;Sitting   Lower Body Bathing: Moderate assistance;Sit to/from stand   Upper Body Dressing : Minimal assistance;Sitting Upper Body Dressing Details (indicate cue type and reason): for brace management. Educated on management and wear scheudle Lower Body Dressing: Moderate assistance;Sit to/from stand Lower Body Dressing Details (indicate cue type and reason): for starting underwear and pants over feet and pulling up in standing. Wife to assist as needed Toilet Transfer: Min guard;Ambulation     Toileting - Clothing Manipulation Details (indicate cue type and reason): Educated on proper technique for peri care without twisting and use of wet wipes Tub/ Shower Transfer: Min guard;Walk-in shower;Ambulation;Shower Scientist, research (medical) Details (indicate cue type and reason): recommend use of shower chair initially with supervision for transfers Functional mobility during ADLs: Min guard General ADL Comments: Educated pt on maintaining back precautions during functional activities, keeping frequently used items at [employer] top height, frequent mobility thorughout the day upon return home     Vision         Perception     Praxis      Pertinent Vitals/Pain Pain Assessment: Faces Faces Pain Scale: Hurts little more Pain Location: back Pain Descriptors / Indicators: Sore Pain Intervention(s): Monitored during session;Repositioned     Hand Dominance     Extremity/Trunk Assessment Upper Extremity Assessment Upper Extremity Assessment: Overall WFL for  tasks assessed   Lower Extremity Assessment Lower Extremity Assessment: Defer to PT evaluation   Cervical / Trunk Assessment Cervical / Trunk Assessment: Other exceptions Cervical / Trunk Exceptions: s/p  PLIF   Communication Communication Communication: No difficulties   Cognition Arousal/Alertness: Awake/alert Behavior During Therapy: WFL for tasks assessed/performed Overall Cognitive Status: Within Functional Limits for tasks assessed                                     General Comments       Exercises     Shoulder Instructions      Home Living Family/patient expects to be discharged to:: Private residence Living Arrangements: Spouse/significant other Available Help at Discharge: Family;Available 24 hours/day Type of Home: House Home Access: Stairs to enter [employer] of Steps: 3 Entrance Stairs-Rails: Right;Left Home Layout: One level     Bathroom Shower/Tub: Occupational psychologist: Standard     Home Equipment: Environmental consultant - 2 wheels;Cane - single point;Bedside commode;Shower seat;Shower seat - built in          Prior Functioning/Environment Level of Independence: Independent                 OT Problem List: Decreased activity tolerance;Impaired balance (sitting and/or standing);Decreased knowledge of use of DME or AE;Decreased knowledge of precautions;Obesity;Pain      OT Treatment/Interventions: Self-care/ADL training;Energy conservation;DME and/or AE instruction;Therapeutic activities;Patient/family education;Balance training    OT Goals(Current goals can be found in the care plan section) Acute Rehab OT Goals Patient Stated Goal: to go home  OT Goal Formulation: With patient Time For Goal Achievement: 07/10/17 Potential to Achieve Goals: Good ADL Goals Pt Will Transfer to Toilet: with supervision;ambulating;regular height toilet Pt Will Perform Toileting - Clothing Manipulation and hygiene: with supervision;sit to/from stand(with or without AE) Additional ADL Goal #1: Pt will independently verbally recall 3/3 back precautions and maintain throughout ADL. Additional ADL Goal #2: Pt will don/doff back brace with set  up as precursor to ADL and mobility. Additional ADL Goal #3: Pt will perform log roll technique for bed mobility with supervision.  OT Frequency: Min 2X/week   Barriers to D/C:            Co-evaluation              AM-PAC PT "6 Clicks" Daily Activity     Outcome Measure Help from another person eating meals?: None Help from another person taking care of personal grooming?: A Little Help from another person toileting, which includes using toliet, bedpan, or urinal?: A Little Help from another person bathing (including washing, rinsing, drying)?: A Lot Help from another person to put on and taking off regular upper body clothing?: A Little Help from another person to put on and taking off regular lower body clothing?: A Lot 6 Click Score: 17   End of Session Equipment Utilized During Treatment: Gait belt;Back brace Nurse Communication: Mobility status;Other (comment)(no eqipment or f/u needs)  Activity Tolerance: Patient tolerated treatment well Patient left: Other (comment);with family/visitor present(sitting EOB)  OT Visit Diagnosis: Unsteadiness on feet (R26.81);Pain Pain - part of body: (back)                Time: 1610-9604 OT Time Calculation (min): 15 min Charges:  OT General Charges $OT Visit: 1 Visit OT Evaluation $OT Eval Moderate Complexity: 1 Mod G-Codes:     Kirk Sampley A. Ulice Brilliant, M.S., OTR/L  Pager: [place] 06/26/2017, 9:10 AM

## 2017-06-26 NOTE — Progress Notes (Signed)
Subjective: The patient is alert and pleasant.  He already feels better.  He looks well.  His wife is at the bedside.  Objective: Vital signs in last 24 hours: Temp:  [97.7 F (36.5 C)-98.8 F (37.1 C)] 97.7 F (36.5 C) (01/04 0430) Pulse Rate:  [60-128] 88 (01/04 0430) Resp:  [12-19] 18 (01/04 0430) BP: (124-150)/(57-100) 124/75 (01/04 0430) SpO2:  [92 %-100 %] 99 % (01/04 0430)  Intake/Output from previous day: 01/03 0701 - 01/04 0700 In: 2250 [I.V.:2000; IV Piggyback:250] Out: 2205 [Urine:1605; Drains:330; Blood:270] Intake/Output this shift: No intake/output data recorded.  Physical exam pleasant.  He is moving his lower extremities well.  Lab Results: Recent Labs    06/26/17 0554  WBC 12.2*  HGB 12.3*  HCT 37.8*  PLT 205   BMET Recent Labs    06/26/17 0554  NA 136  K 3.7  CL 100*  CO2 27  GLUCOSE 198*  BUN 14  CREATININE 1.29*  CALCIUM 8.8*    Studies/Results: Dg Lumbar Spine 2-3 Views  Result Date: 06/25/2017 CLINICAL DATA:  PLIF of L4-5. FLUOROSCOPY TIME:  29 seconds. Images: 2 EXAM: LUMBAR SPINE - 2-3 VIEW COMPARISON:  None. FINDINGS: Images were obtained during pedicle screw placements at L4 and L5. A disc spacer device is placed at the same level. IMPRESSION: PLIF of L4-5. Electronically Signed   By: Dorise Bullion III M.D   On: 06/25/2017 13:26   Dg Lumbar Spine 1 View  Result Date: 06/25/2017 CLINICAL DATA:  L4-5 posterior fixation. EXAM: LUMBAR SPINE - 1 VIEW COMPARISON:  05/22/2017 MRI FINDINGS: Single lateral view labeled 1026 hours. This demonstrates a surgical pointer projecting posterior to the lumbosacral junction. Soft tissue expanders project posterior to the L4 and L5 vertebral bodies. Degenerate disc disease at multiple levels. IMPRESSION: Intraoperative localization of L4, L5, and the lumbosacral junction. Electronically Signed   By: Abigail Miyamoto M.D.   On: 06/25/2017 11:56   Dg C-arm 1-60 Min  Result Date: 06/25/2017 CLINICAL DATA:   Lumbar spine fusion. EXAM: DG C-ARM 61-120 MIN COMPARISON:  06/25/2017. FINDINGS: Posterior and interbody L4-L5 fusion. Hardware intact. Anatomic alignment . IMPRESSION: Posterior and interbody L4-L5 fusion.  Anatomic alignment . Electronically Signed   By: Marcello Moores  Register   On: 06/25/2017 13:28    Assessment/Plan: Postop day #1: The patient is doing well.  We will discontinue his Jackson-Pratt drain.  He will likely go home over the weekend.  I have given them his discharge instructions and answered all their questions.  LOS: 1 day     [name] 06/26/2017, 7:51 AM     Patient ID: Travis Watts, male   DOB: [birth year], 72 y.o.   MRN: [id number]

## 2017-06-26 NOTE — Anesthesia Postprocedure Evaluation (Signed)
Anesthesia Post Note  Patient: Travis Watts  Procedure(s) Performed: POSTERIOR LUMBAR INTERBODY FUSION, INTERBODY PROSTHESIS, POSTERIOR INSTRUMENTATION LUMBAR FOUR- LUMBAR FIVE;; LAMINECTIMY/LAMINOTOMY LUMBAR TWO- LUMBAR THREE, LUMBAR THREE- LUMBAR FOUR (N/A Spine Lumbar)     Patient location during evaluation: PACU Anesthesia Type: General Level of consciousness: awake and alert Pain management: pain level controlled Vital Signs Assessment: post-procedure vital signs reviewed and stable Respiratory status: spontaneous breathing, nonlabored ventilation, respiratory function stable and patient connected to nasal cannula oxygen Cardiovascular status: blood pressure returned to baseline and stable Postop Assessment: no apparent nausea or vomiting Anesthetic complications: no    Last Vitals:  Vitals:   06/25/17 2300 06/26/17 0430  BP: 134/69 124/75  Pulse: (!) 115 88  Resp: 18 18  Temp: 36.9 C 36.5 C  SpO2: 94% 99%    Last Pain:  Vitals:   06/26/17 0532  TempSrc:   PainSc: [name]

## 2017-06-27 LAB — GLUCOSE, CAPILLARY: Glucose-Capillary: 205 mg/dL — ABNORMAL HIGH (ref 65–99)

## 2017-06-27 MED ORDER — METHOCARBAMOL 750 MG PO TABS: 750.0000 mg | ORAL_TABLET | Freq: Four times a day (QID) | ORAL | 1 refills | Status: DC | PRN

## 2017-06-27 MED ORDER — OXYCODONE-ACETAMINOPHEN 5-325 MG PO TABS: 1.0000 | ORAL_TABLET | ORAL | 0 refills | Status: DC | PRN

## 2017-06-27 NOTE — Discharge Instructions (Signed)
Wound Care Keep incision covered and dry for two days.   Do not put any creams, lotions, or ointments on incision. Leave steri-strips on back.  They will fall off by themselves. Activity Walk each and every day, increasing distance each day. No lifting greater than 5 lbs.  Avoid bending, lifting and twisting No driving for 2 weeks; may ride as a passenger locally. If provided with back brace, wear when out of bed.  It is not necessary to wear brace in bed. Diet Resume your normal diet.   Call Your Doctor If Any of These Occur Redness, drainage, or swelling at the wound.  Temperature greater than 101 degrees. Severe pain not relieved by pain medication. Incision starts to come apart. Follow Up Appt Call today for appointment in 1-2 weeks ([phone]) or for problems.  If you have any hardware placed in your spine, you will need an x-ray before your appointment.

## 2017-06-27 NOTE — Progress Notes (Signed)
Physical Therapy Treatment Patient Details Name: Travis Watts MRN: [id number] DOB: [birth year] Today's Date: 06/27/2017    History of Present Illness Pt is a 72 y/o male s/p L4-5 PLIF. PMH includes a fib, lung cancer, HTN, CVA, OSA, bilat TKA, and L shoulder replacement.     PT Comments    Patient continues to progress toward mobility goals. Supervision for bed mobility and pt able to perform log roll technique with min cues. Pt required min guard/min A for safe ambulation without AD. Pt with 1 LOB with directional change and required min A to recover. Wife reported pt is more unsteady when he hasn't slept well and this is baseline. Pt has cane at home. Pt unable to recall precautions due to short term memory deficits at baseline however wife is able to recall 3/3 precautions. Current plan remains appropriate.    Follow Up Recommendations  No PT follow up;Supervision for mobility/OOB     Equipment Recommendations  None recommended by PT    Recommendations for Other Services       Precautions / Restrictions Precautions Precautions: Back Precaution Comments: Reviewed precautions with pt and wife;wife able to recall 3/3 Required Braces or Orthoses: Spinal Brace Spinal Brace: Lumbar corset;Applied in sitting position Restrictions Weight Bearing Restrictions: No    Mobility  Bed Mobility Overal bed mobility: Needs Assistance Bed Mobility: Rolling;Sidelying to Sit Rolling: Supervision Sidelying to sit: Supervision       General bed mobility comments: cues for sequencing and pt encouraged to be more independent; HOB flat and no use of rail: supervision for safety  Transfers Overall transfer level: Needs assistance Equipment used: None Transfers: Sit to/from Stand Sit to Stand: Min guard         General transfer comment: min guard for safety  Ambulation/Gait Ambulation/Gait assistance: Min assist;Min guard Ambulation Distance (Feet): 400 Feet Assistive device:  None Gait Pattern/deviations: Wide base of support;Trendelenburg;Step-through pattern;Decreased stride length Gait velocity: Decreased    General Gait Details: slow, grossly steady gait with LOB X1 with directional change requiring min A to recover   Stairs            Wheelchair Mobility    Modified Rankin (Stroke Patients Only)       Balance Overall balance assessment: Needs assistance Sitting-balance support: Feet supported;No upper extremity supported Sitting balance-Leahy Scale: Good     Standing balance support: No upper extremity supported;During functional activity Standing balance-Leahy Scale: Fair Standing balance comment: for static standing                            Cognition Arousal/Alertness: Awake/alert Behavior During Therapy: WFL for tasks assessed/performed Overall Cognitive Status: History of cognitive impairments - at baseline Area of Impairment: Memory                     Memory: Decreased short-term memory;Decreased recall of precautions                Exercises      General Comments General comments (skin integrity, edema, etc.): wife present throughout session      Pertinent Vitals/Pain Pain Assessment: Faces Faces Pain Scale: Hurts little more Pain Location: back Pain Descriptors / Indicators: Sore Pain Intervention(s): Monitored during session;Premedicated before session;Limited activity within patient's tolerance;Repositioned    Home Living                      Prior Function  PT Goals (current goals can now be found in the care plan section) Acute Rehab PT Goals Patient Stated Goal: to go home  PT Goal Formulation: With patient/family Time For Goal Achievement: 07/09/17 Potential to Achieve Goals: Good Progress towards PT goals: Progressing toward goals    Frequency    Min 5X/week      PT Plan Current plan remains appropriate    Co-evaluation               AM-PAC PT "6 Clicks" Daily Activity  Outcome Measure  Difficulty turning over in bed (including adjusting bedclothes, sheets and blankets)?: A Little Difficulty moving from lying on back to sitting on the side of the bed? : A Little Difficulty sitting down on and standing up from a chair with arms (e.g., wheelchair, bedside commode, etc,.)?: A Little Help needed moving to and from a bed to chair (including a wheelchair)?: A Little Help needed walking in hospital room?: A Little Help needed climbing 3-5 steps with a railing? : A Little 6 Click Score: 18    End of Session Equipment Utilized During Treatment: Back brace Activity Tolerance: Patient tolerated treatment well Patient left: in chair;with call bell/phone within reach;with family/visitor present Nurse Communication: Mobility status PT Visit Diagnosis: Unsteadiness on feet (R26.81);Other abnormalities of gait and mobility (R26.89);Pain Pain - Right/Left: Left Pain - part of body: (back)     Time: 3143-8887 PT Time Calculation (min) (ACUTE ONLY): 10 min  Charges:  $Gait Training: 8-22 mins                    G Codes:       Travis Watts, PTA Pager: [phone]     Travis Watts 06/27/2017, 10:21 AM

## 2017-06-27 NOTE — Progress Notes (Signed)
Patient alert and oriented, mae's well, voiding adequate amount of urine, swallowing without difficulty,  c/o mild pain at time of discharge and medication already. Patient discharged home with family. Script and discharged instructions given to patient. Patient and family stated understanding of instructions given. Patient has an appointment with Dr. Arnoldo Morale

## 2017-06-27 NOTE — Progress Notes (Signed)
Subjective: Patient reports doing well.  Objective: Vital signs in last 24 hours: Temp:  [97.5 F (36.4 C)-98.7 F (37.1 C)] 98.5 F (36.9 C) (01/05 0336) Pulse Rate:  [72-112] 91 (01/05 0336) Resp:  [16-18] 18 (01/05 0336) BP: (104-134)/(65-85) 129/73 (01/05 0336) SpO2:  [93 %-96 %] 93 % (01/05 0336)  Intake/Output from previous day: 01/04 0701 - 01/05 0700 In: 360 [P.O.:360] Out: 500 [Urine:500] Intake/Output this shift: No intake/output data recorded.  Physical Exam: Strength full.  Numbness improved.  Dressing changed.  Lab Results: Recent Labs    06/26/17 0554  WBC 12.2*  HGB 12.3*  HCT 37.8*  PLT 205   BMET Recent Labs    06/26/17 0554  NA 136  K 3.7  CL 100*  CO2 27  GLUCOSE 198*  BUN 14  CREATININE 1.29*  CALCIUM 8.8*    Studies/Results: Dg Lumbar Spine 2-3 Views  Result Date: 06/25/2017 CLINICAL DATA:  PLIF of L4-5. FLUOROSCOPY TIME:  29 seconds. Images: 2 EXAM: LUMBAR SPINE - 2-3 VIEW COMPARISON:  None. FINDINGS: Images were obtained during pedicle screw placements at L4 and L5. A disc spacer device is placed at the same level. IMPRESSION: PLIF of L4-5. Electronically Signed   By: Dorise Bullion III M.D   On: 06/25/2017 13:26   Dg Lumbar Spine 1 View  Result Date: 06/25/2017 CLINICAL DATA:  L4-5 posterior fixation. EXAM: LUMBAR SPINE - 1 VIEW COMPARISON:  05/22/2017 MRI FINDINGS: Single lateral view labeled 1026 hours. This demonstrates a surgical pointer projecting posterior to the lumbosacral junction. Soft tissue expanders project posterior to the L4 and L5 vertebral bodies. Degenerate disc disease at multiple levels. IMPRESSION: Intraoperative localization of L4, L5, and the lumbosacral junction. Electronically Signed   By: Abigail Miyamoto M.D.   On: 06/25/2017 11:56   Dg C-arm 1-60 Min  Result Date: 06/25/2017 CLINICAL DATA:  Lumbar spine fusion. EXAM: DG C-ARM 61-120 MIN COMPARISON:  06/25/2017. FINDINGS: Posterior and interbody L4-L5 fusion.  Hardware intact. Anatomic alignment . IMPRESSION: Posterior and interbody L4-L5 fusion.  Anatomic alignment . Electronically Signed   By: Marcello Moores  Register   On: 06/25/2017 13:28    Assessment/Plan: Doing well.  Discharge home.    LOS: 2 days    Peggyann Shoals, MD 06/27/2017, 7:20 AM

## 2017-06-27 NOTE — Discharge Summary (Signed)
Physician Discharge Summary  Patient ID: Travis Watts MRN: [id number] DOB/AGE: August 21, 1945 72 y.o.  Admit date: 06/25/2017 Discharge date: 06/27/2017  Admission Diagnoses:Lumbar spinal stenosis with spondylolisthesis L 2 - L 5 levels  Discharge Diagnoses: Same Active Problems:   Spondylolisthesis of lumbar region   Discharged Condition: good  Hospital Course: Patient underwent decompression L 2 - L 5 levels with fusion L 45 level.  He did well after surgery and was discharged home on POD 2.  Consults: None  Significant Diagnostic Studies: None  Treatments: surgery: Patient underwent decompression L 2 - L 5 levels with fusion L 45 level  Discharge Exam: Blood pressure 129/73, pulse 91, temperature 98.5 F (36.9 C), temperature source Oral, resp. rate 18, SpO2 93 %. Neurologic: Alert and oriented X 3, normal strength and tone. Normal symmetric reflexes. Normal coordination and gait Wound:CDI  Disposition: Home  Discharge Instructions    Diet - low sodium heart healthy   Complete by:  As directed    Increase activity slowly   Complete by:  As directed      Allergies as of 06/27/2017   No Known Allergies     Medication List    STOP taking these medications   clopidogrel 75 MG tablet Commonly known as:  PLAVIX     TAKE these medications   aspirin EC 81 MG tablet Take 81 mg by mouth daily.   atorvastatin 40 MG tablet Commonly known as:  LIPITOR Take 20 mg by mouth daily.   celecoxib 200 MG capsule Commonly known as:  CELEBREX Take 200 mg by mouth 2 (two) times daily.   digoxin 0.125 MG tablet Commonly known as:  LANOXIN Take 0.125 mg by mouth at bedtime.   DULoxetine 60 MG capsule Commonly known as:  CYMBALTA Take 60 mg by mouth daily.   furosemide 80 MG tablet Commonly known as:  LASIX Take 80 mg by mouth 3 (three) times a week.   gabapentin 400 MG capsule Commonly known as:  NEURONTIN Take 400-800 mg by mouth See admin instructions. Take 400 mg by  mouth in the morning and take 800 mg by mouth at bedtime   glipiZIDE 10 MG tablet Commonly known as:  GLUCOTROL Take 10 mg by mouth 2 (two) times daily.   HYDROcodone-acetaminophen 5-325 MG tablet Commonly known as:  NORCO/VICODIN Take 1 tablet by mouth 2 (two) times daily as needed for moderate pain.   lisinopril 20 MG tablet Commonly known as:  PRINIVIL,ZESTRIL Take 20 mg by mouth daily.   magnesium oxide 400 MG tablet Commonly known as:  MAG-OX Take 400 mg by mouth daily.   metFORMIN 1000 MG tablet Commonly known as:  GLUCOPHAGE Take 1,000 mg by mouth 2 (two) times daily.   methocarbamol 750 MG tablet Commonly known as:  ROBAXIN Take 1 tablet (750 mg total) by mouth every 6 (six) hours as needed for muscle spasms.   metoprolol succinate 25 MG 24 hr tablet Commonly known as:  TOPROL-XL Take 25 mg by mouth daily.   omeprazole 20 MG capsule Commonly known as:  PRILOSEC Take 20 mg by mouth 2 (two) times daily.   oxyCODONE-acetaminophen 5-325 MG tablet Commonly known as:  PERCOCET/ROXICET Take 1-2 tablets by mouth every 4 (four) hours as needed for severe pain.   QUEtiapine 100 MG tablet Commonly known as:  SEROQUEL Take 150 mg by mouth at bedtime.   solifenacin 5 MG tablet Commonly known as:  VESICARE Take 5 mg by mouth daily.   tamsulosin  0.4 MG Caps capsule Commonly known as:  FLOMAX Take 0.4 mg by mouth at bedtime.   Vitamin D 2000 units tablet Take 2,000 Units by mouth daily.   warfarin 5 MG tablet Commonly known as:  COUMADIN Take 7.5 mg by mouth daily        Signed: Peggyann Shoals, MD 06/27/2017, 7:17 AM

## 2017-06-27 NOTE — Progress Notes (Signed)
Occupational Therapy Treatment Patient Details Name: Travis Watts MRN: [id number] DOB: 12/20/1945 Today's Date: 06/27/2017    History of present illness Pt is a 72 y/o male s/p L4-5 PLIF. PMH includes a fib, lung cancer, HTN, CVA, OSA, bilat TKA, and L shoulder replacement.    OT comments  Pt progressing towards established goals. Pt with difficulty recalling back precautions due to baseline ST memory deficits. Wife able to recall all precautions and education provided. Reviewed compensatory techniques for grooming, bed mobility, LB ADLs, and brace management. Answered all pt and wife's questions in preparation for dc later today.    Follow Up Recommendations  No OT follow up;Supervision/Assistance - 24 hour    Equipment Recommendations  None recommended by OT    Recommendations for Other Services      Precautions / Restrictions Precautions Precautions: Back Precaution Comments: Reviewed precautions with pt and wife;wife able to recall 3/3. Wife reporting pt has ST memory deficits at baseline; pt unabel to recall precautions. Required Braces or Orthoses: Spinal Brace Spinal Brace: Lumbar corset;Applied in sitting position Restrictions Weight Bearing Restrictions: No       Mobility Bed Mobility Overal bed mobility: Needs Assistance Bed Mobility: Rolling;Sit to Supine Rolling: Supervision Sidelying to sit: Supervision   Sit to supine: Supervision   General bed mobility comments: Supervision for safety and cues for safety  Transfers Overall transfer level: Needs assistance Equipment used: None Transfers: Sit to/from Stand Sit to Stand: Min guard         General transfer comment: min guard for safety    Balance Overall balance assessment: Needs assistance Sitting-balance support: Feet supported;No upper extremity supported Sitting balance-Leahy Scale: Good     Standing balance support: No upper extremity supported;During functional activity Standing  balance-Leahy Scale: Fair Standing balance comment: for static standing                           ADL either performed or assessed with clinical judgement   ADL Overall ADL's : Needs assistance/impaired       Grooming Details (indicate cue type and reason): Reviewed compensatory techniques for grooming           Upper Body Dressing Details (indicate cue type and reason): Reviewed brace management   Lower Body Dressing Details (indicate cue type and reason): Reviewed compensatory techniques for LB ADLs. Wife and pt verablize understanding             Functional mobility during ADLs: Min guard General ADL Comments: Reviewed all education including brace manamagement, bed mobility, LB ADLs, back precautions, and grooming. Pt and wife verbalize understanding.     Vision       Perception     Praxis      Cognition Arousal/Alertness: Awake/alert Behavior During Therapy: WFL for tasks assessed/performed Overall Cognitive Status: History of cognitive impairments - at baseline Area of Impairment: Memory                     Memory: Decreased short-term memory;Decreased recall of precautions         General Comments: Wife able to recall 3/3 precautions and states she will be with him at home 24/7        Exercises     Shoulder Instructions       General Comments Wife present throughout session    Pertinent Vitals/ Pain       Pain Assessment: Faces Faces Pain Scale: Hurts little  more Pain Location: back Pain Descriptors / Indicators: Sore Pain Intervention(s): Monitored during session;Limited activity within patient's tolerance;Repositioned  Home Living                                          Prior Functioning/Environment              Frequency  Min 2X/week        Progress Toward Goals  OT Goals(current goals can now be found in the care plan section)  Progress towards OT goals: Progressing toward  goals  Acute Rehab OT Goals Patient Stated Goal: to go home  OT Goal Formulation: With patient Time For Goal Achievement: 07/10/17 Potential to Achieve Goals: Good ADL Goals Pt Will Transfer to Toilet: with supervision;ambulating;regular height toilet Pt Will Perform Toileting - Clothing Manipulation and hygiene: with supervision;sit to/from stand(with or without AE) Additional ADL Goal #1: Pt will independently verbally recall 3/3 back precautions and maintain throughout ADL. Additional ADL Goal #2: Pt will don/doff back brace with set up as precursor to ADL and mobility. Additional ADL Goal #3: Pt will perform log roll technique for bed mobility with supervision.  Plan Discharge plan remains appropriate    Co-evaluation                 AM-PAC PT "6 Clicks" Daily Activity     Outcome Measure   Help from another person eating meals?: None Help from another person taking care of personal grooming?: A Little Help from another person toileting, which includes using toliet, bedpan, or urinal?: A Little Help from another person bathing (including washing, rinsing, drying)?: A Lot Help from another person to put on and taking off regular upper body clothing?: A Little Help from another person to put on and taking off regular lower body clothing?: A Lot 6 Click Score: 17    End of Session Equipment Utilized During Treatment: Back brace  OT Visit Diagnosis: Unsteadiness on feet (R26.81);Pain Pain - part of body: (back)   Activity Tolerance Patient tolerated treatment well   Patient Left with family/visitor present;in bed;with call bell/phone within reach(sitting EOB)   Nurse Communication Mobility status        Time: 2707-8675 OT Time Calculation (min): 13 min  Charges: OT General Charges $OT Visit: 1 Visit OT Treatments $Self Care/Home Management : 8-22 mins  [place], OTR/L Acute Rehab Pager: [phone] Office: [place] 06/27/2017, 10:26 AM

## 2017-07-17 DIAGNOSIS — M48062 Spinal stenosis, lumbar region with neurogenic claudication: Secondary | ICD-10-CM | POA: Diagnosis not present

## 2017-07-17 DIAGNOSIS — M4316 Spondylolisthesis, lumbar region: Secondary | ICD-10-CM | POA: Diagnosis not present

## 2017-07-17 DIAGNOSIS — I10 Essential (primary) hypertension: Secondary | ICD-10-CM | POA: Diagnosis not present

## 2017-07-17 DIAGNOSIS — Z6841 Body Mass Index (BMI) 40.0 and over, adult: Secondary | ICD-10-CM | POA: Diagnosis not present

## 2017-08-07 DIAGNOSIS — I110 Hypertensive heart disease with heart failure: Secondary | ICD-10-CM | POA: Diagnosis not present

## 2017-08-07 DIAGNOSIS — J441 Chronic obstructive pulmonary disease with (acute) exacerbation: Secondary | ICD-10-CM | POA: Diagnosis not present

## 2017-08-07 DIAGNOSIS — I482 Chronic atrial fibrillation: Secondary | ICD-10-CM | POA: Diagnosis not present

## 2017-08-07 DIAGNOSIS — I5022 Chronic systolic (congestive) heart failure: Secondary | ICD-10-CM | POA: Diagnosis not present

## 2017-08-14 DIAGNOSIS — I110 Hypertensive heart disease with heart failure: Secondary | ICD-10-CM | POA: Diagnosis not present

## 2017-08-14 DIAGNOSIS — D519 Vitamin B12 deficiency anemia, unspecified: Secondary | ICD-10-CM | POA: Diagnosis not present

## 2017-08-14 DIAGNOSIS — Z79899 Other long term (current) drug therapy: Secondary | ICD-10-CM | POA: Diagnosis not present

## 2017-08-14 DIAGNOSIS — I5022 Chronic systolic (congestive) heart failure: Secondary | ICD-10-CM | POA: Diagnosis not present

## 2017-08-14 DIAGNOSIS — E039 Hypothyroidism, unspecified: Secondary | ICD-10-CM | POA: Diagnosis not present

## 2017-08-14 DIAGNOSIS — I482 Chronic atrial fibrillation: Secondary | ICD-10-CM | POA: Diagnosis not present

## 2017-08-14 DIAGNOSIS — Z7901 Long term (current) use of anticoagulants: Secondary | ICD-10-CM | POA: Diagnosis not present

## 2017-08-14 DIAGNOSIS — J441 Chronic obstructive pulmonary disease with (acute) exacerbation: Secondary | ICD-10-CM | POA: Diagnosis not present

## 2017-08-14 DIAGNOSIS — E559 Vitamin D deficiency, unspecified: Secondary | ICD-10-CM | POA: Diagnosis not present

## 2017-08-18 ENCOUNTER — Ambulatory Visit: Payer: Medicare Other | Admitting: Cardiology

## 2017-08-18 DIAGNOSIS — C3492 Malignant neoplasm of unspecified part of left bronchus or lung: Secondary | ICD-10-CM | POA: Diagnosis not present

## 2017-08-18 DIAGNOSIS — C3490 Malignant neoplasm of unspecified part of unspecified bronchus or lung: Secondary | ICD-10-CM | POA: Diagnosis not present

## 2017-08-20 ENCOUNTER — Encounter: Payer: Self-pay | Admitting: Cardiology

## 2017-08-20 ENCOUNTER — Ambulatory Visit (INDEPENDENT_AMBULATORY_CARE_PROVIDER_SITE_OTHER): Payer: Medicare Other | Admitting: Cardiology

## 2017-08-20 VITALS — BP 120/62 | HR 102 | Ht 72.0 in | Wt 285.8 lb

## 2017-08-20 DIAGNOSIS — I482 Chronic atrial fibrillation, unspecified: Secondary | ICD-10-CM

## 2017-08-20 DIAGNOSIS — I25119 Atherosclerotic heart disease of native coronary artery with unspecified angina pectoris: Secondary | ICD-10-CM | POA: Diagnosis not present

## 2017-08-20 DIAGNOSIS — I2510 Atherosclerotic heart disease of native coronary artery without angina pectoris: Secondary | ICD-10-CM | POA: Diagnosis not present

## 2017-08-20 DIAGNOSIS — R0602 Shortness of breath: Secondary | ICD-10-CM | POA: Diagnosis not present

## 2017-08-20 DIAGNOSIS — I10 Essential (primary) hypertension: Secondary | ICD-10-CM

## 2017-08-20 NOTE — Progress Notes (Signed)
Cardiology Office Note:    Date:  08/20/2017   ID:  Travis Watts, DOB Nov 08, 1945, MRN [id number]  PCP:  Travis Freeze, MD  Cardiologist:  Travis Campus, MD    Referring MD: Travis Freeze, MD   Chief Complaint  Patient presents with  . 2 month follow up  Not doing well.  History of Present Illness:    Travis Watts is a 72 y.o. male with coronary artery disease, peripheral vascular disease, diabetes, lung cancer, rectal cancer.  Recently had back surgery and doing quite well after surgery however he developed pneumonia that was recently treated in the matter-of-fact yesterday he finished his last dosages of medications.  Today he is not feeling well he complained of being short of breath he complained of just being tired and exhausted he kind of tired all distant area.  In my opinion he is depressed.  Does have history of posttraumatic stress disorder he did have psychiatry visit previously because of that I strongly recommended for him to see a psychiatrist.  They promised me to make an effort to get in touch with his psychiatrist.  He does not have any suicidal ideas.  Past Medical History:  Diagnosis Date  . A-fib ([place])   . Anxiety   . Arthritis    previous to joint replacement - knees & shoulder.  Pt.'s R foot fused, traumatized from exposure to agent ORANGE  . Cancer ([place])    lung  . CHF (congestive heart failure) ([place])   . Coronary artery disease   . Depression   . Diabetes mellitus without complication ([place])    diagnosed- 89yrs.   Marland Kitchen Dyspnea   . Dysrhythmia   . GERD (gastroesophageal reflux disease)   . Hyperlipidemia   . Hypertension   . Neuromuscular disorder (HCC)    neuropathy- feet & legs  . Pneumonia    several times, but treated from his home, not hosp.   . Sleep apnea    not able to CPAP, since lung beiing removed- wife reports no problem.   . Stroke (cerebrum) [facility])    as a 72 y.o.     Past Surgical History:  Procedure Laterality Date  .  ANKLE FUSION    . CARDIAC CATHETERIZATION    . EYE SURGERY Left    cataract removed   . JOINT REPLACEMENT Left 2016   shoulder replacement   . LUNG LOBECTOMY Left 2016   done at [place]    . SKIN CANCER EXCISION      Current Medications: Current Meds  Medication Sig  . aspirin EC 81 MG tablet Take 81 mg by mouth daily.  Marland Kitchen atorvastatin (LIPITOR) 40 MG tablet Take 20 mg by mouth daily.  . celecoxib (CELEBREX) 200 MG capsule Take 200 mg by mouth 2 (two) times daily.  . Cholecalciferol (VITAMIN D) 2000 units tablet Take 2,000 Units by mouth daily.   . digoxin (LANOXIN) 0.125 MG tablet Take 0.125 mg by mouth at bedtime.   . DULoxetine (CYMBALTA) 60 MG capsule Take 60 mg by mouth daily.  . furosemide (LASIX) 80 MG tablet Take 80 mg by mouth 3 (three) times a week.   . gabapentin (NEURONTIN) 400 MG capsule Take 400-800 mg by mouth See admin instructions. Take 400 mg by mouth in the morning and take 800 mg by mouth at bedtime  . glipiZIDE (GLUCOTROL) 10 MG tablet Take 10 mg by mouth 2 (two) times daily.  Marland Kitchen  HYDROcodone-acetaminophen (NORCO/VICODIN) 5-325 MG tablet Take 1 tablet by mouth 2 (two) times daily as needed for moderate pain.   Marland Kitchen lisinopril (PRINIVIL,ZESTRIL) 20 MG tablet Take 20 mg by mouth daily.  . magnesium oxide (MAG-OX) 400 MG tablet Take 400 mg by mouth daily.  . methocarbamol (ROBAXIN) 750 MG tablet Take 1 tablet (750 mg total) by mouth every 6 (six) hours as needed for muscle spasms.  . metoprolol succinate (TOPROL-XL) 25 MG 24 hr tablet Take 25 mg by mouth daily.  Marland Kitchen omeprazole (PRILOSEC) 20 MG capsule Take 20 mg by mouth 2 (two) times daily.  Marland Kitchen oxyCODONE-acetaminophen (PERCOCET/ROXICET) 5-325 MG tablet Take 1-2 tablets by mouth every 4 (four) hours as needed for severe pain.  Marland Kitchen QUEtiapine (SEROQUEL) 100 MG tablet Take 150 mg by mouth at bedtime.   . solifenacin (VESICARE) 5 MG tablet Take 5 mg by mouth daily.   . tamsulosin (FLOMAX)  0.4 MG CAPS capsule Take 0.4 mg by mouth at bedtime.   Marland Kitchen warfarin (COUMADIN) 5 MG tablet Take 7.5 mg by mouth daily     Allergies:   Patient has no known allergies.   Social History   Socioeconomic History  . Marital status: Married    Spouse name: None  . Number of children: None  . Years of education: None  . Highest education level: None  Social Needs  . Financial resource strain: None  . Food insecurity - worry: None  . Food insecurity - inability: None  . Transportation needs - medical: None  . Transportation needs - non-medical: None  Occupational History  . None  Tobacco Use  . Smoking status: Former Smoker    Last attempt to quit: 06/18/1994    Years since quitting: 23.1  . Smokeless tobacco: Never Used  Substance and Sexual Activity  . Alcohol use: No  . Drug use: No  . Sexual activity: None  Other Topics Concern  . None  Social History Narrative  . None     Family History: The patient's family history includes Depression in his mother; Early death in his brother, father, maternal grandmother, and maternal uncle; Heart attack in his brother, father, and paternal uncle; Heart disease in his brother and father; Heart failure in his brother and father; Hyperlipidemia in his brother and father; Hypertension in his brother and father; Osteoarthritis in his father; Rheum arthritis in his father; Stroke in his maternal grandmother and maternal uncle. ROS:   Please see the history of present illness.    All 14 point review of systems negative except as described per history of present illness  EKGs/Labs/Other Studies Reviewed:      Recent Labs: 03/31/2017: NT-Pro BNP 383 06/26/2017: BUN 14; Creatinine, Ser 1.29; Hemoglobin 12.3; Platelets 205; Potassium 3.7; Sodium 136  Recent Lipid Panel No results found for: CHOL, TRIG, HDL, CHOLHDL, VLDL, LDLCALC, LDLDIRECT  Physical Exam:    VS:  BP 120/62   Pulse (!) 102   Ht 6' (1.829 m)   Wt 285 lb 12.8 oz (129.6 kg)    SpO2 97%   BMI 38.76 kg/m     Wt Readings from Last 3 Encounters:  08/20/17 285 lb 12.8 oz (129.6 kg)  06/19/17 (!) 300 lb 7.8 oz (136.3 kg)  06/17/17 (!) 303 lb 12.8 oz (137.8 kg)     GEN:  Well nourished, well developed in no acute distress HEENT: Normal NECK: No JVD; No carotid bruits LYMPHATICS: No lymphadenopathy CARDIAC:  , no murmurs, no rubs, no gallops RESPIRATORY:  ,  bilateral rhonchi but worse in the right poor air entry in the left ABDOMEN: Soft, non-tender, non-distended MUSCULOSKELETAL:  No edema; No deformity  SKIN: Warm and dry LOWER EXTREMITIES: no swelling NEUROLOGIC:  Alert and oriented x 3 PSYCHIATRIC:  Normal affect   ASSESSMENT:    1. Atherosclerosis of native coronary artery of native heart with angina pectoris ([place])   2. Coronary artery disease involving native coronary artery of native heart without angina pectoris   3. Essential hypertension   4. Chronic atrial fibrillation (HCC)    PLAN:    In order of problems listed above:  1. Atherosclerosis of coronary artery, doing well from that point to be asymptomatic went to surgery with no trouble.  Stress test was negative. 2. Essential hypertension: Blood pressure well controlled continue present management. 3. Chronic atrial fibrillation: Anticoagulated which I will continue rate control. 4. Depression: I strongly recommended to see a psychiatrist.  They promised me that there will do it.  Ask him to have a EKG today we will check also CBC as well as proBNP and BMP.  I will check his pulse oximetry my can walk around to see if there is any significant drop in oxygen saturation.     Medication Adjustments/Labs and Tests Ordered: Current medicines are reviewed at length with the patient today.  Concerns regarding medicines are outlined above.  No orders of the defined types were placed in this encounter.  Medication changes: No orders of the defined types were placed in this  encounter.   Signed, Park Liter, MD, [facility] 08/20/2017 10:05 AM    [place]

## 2017-08-20 NOTE — Patient Instructions (Signed)
Medication Instructions:  Your physician recommends that you continue on your current medications as directed. Please refer to the Current Medication list given to you today.   Labwork: Your physician recommends that you return for lab work today. CBC, BMP, Pro-BNP.   Testing/Procedures: You had an EKG today.   Follow-Up: Your physician recommends that you schedule a follow-up appointment in: 2 months.   Any Other Special Instructions Will Be Listed Below (If Applicable).     If you need a refill on your cardiac medications before your next appointment, please call your pharmacy.

## 2017-08-21 LAB — CBC WITH DIFFERENTIAL/PLATELET
BASOS: 0 %
Basophils Absolute: 0 10*3/uL (ref 0.0–0.2)
EOS (ABSOLUTE): 0.3 10*3/uL (ref 0.0–0.4)
Eos: 2 %
HEMATOCRIT: 47.2 % (ref 37.5–51.0)
HEMOGLOBIN: 16.1 g/dL (ref 13.0–17.7)
Immature Grans (Abs): 0.2 10*3/uL — ABNORMAL HIGH (ref 0.0–0.1)
Immature Granulocytes: 1 %
LYMPHS ABS: 2.4 10*3/uL (ref 0.7–3.1)
Lymphs: 16 %
MCH: 29.4 pg (ref 26.6–33.0)
MCHC: 34.1 g/dL (ref 31.5–35.7)
MCV: 86 fL (ref 79–97)
MONOCYTES: 7 %
Monocytes Absolute: 1.1 10*3/uL — ABNORMAL HIGH (ref 0.1–0.9)
NEUTROS ABS: 10.9 10*3/uL — AB (ref 1.4–7.0)
Neutrophils: 74 %
Platelets: 266 10*3/uL (ref 150–379)
RBC: 5.47 x10E6/uL (ref 4.14–5.80)
RDW: 15.3 % (ref 12.3–15.4)
WBC: 14.9 10*3/uL — ABNORMAL HIGH (ref 3.4–10.8)

## 2017-08-21 LAB — BASIC METABOLIC PANEL
BUN / CREAT RATIO: 19 (ref 10–24)
BUN: 19 mg/dL (ref 8–27)
CO2: 21 mmol/L (ref 20–29)
CREATININE: 1.01 mg/dL (ref 0.76–1.27)
Calcium: 9.6 mg/dL (ref 8.6–10.2)
Chloride: 96 mmol/L (ref 96–106)
GFR calc Af Amer: 87 mL/min/{1.73_m2} (ref >59)
GFR, EST NON AFRICAN AMERICAN: 75 mL/min/{1.73_m2} (ref >59)
Glucose: 222 mg/dL — ABNORMAL HIGH (ref 65–99)
Potassium: 4.4 mmol/L (ref 3.5–5.2)
SODIUM: 138 mmol/L (ref 134–144)

## 2017-08-21 LAB — PRO B NATRIURETIC PEPTIDE: NT-Pro BNP: 241 pg/mL (ref 0–376)

## 2017-09-01 DIAGNOSIS — E039 Hypothyroidism, unspecified: Secondary | ICD-10-CM | POA: Diagnosis not present

## 2017-09-01 DIAGNOSIS — E1129 Type 2 diabetes mellitus with other diabetic kidney complication: Secondary | ICD-10-CM | POA: Diagnosis not present

## 2017-09-15 DIAGNOSIS — R809 Proteinuria, unspecified: Secondary | ICD-10-CM | POA: Diagnosis not present

## 2017-09-15 DIAGNOSIS — I482 Chronic atrial fibrillation: Secondary | ICD-10-CM | POA: Diagnosis not present

## 2017-09-15 DIAGNOSIS — R000 Tachycardia, unspecified: Secondary | ICD-10-CM | POA: Diagnosis not present

## 2017-09-15 DIAGNOSIS — Z7901 Long term (current) use of anticoagulants: Secondary | ICD-10-CM | POA: Diagnosis not present

## 2017-09-15 DIAGNOSIS — Z6839 Body mass index (BMI) 39.0-39.9, adult: Secondary | ICD-10-CM | POA: Diagnosis not present

## 2017-09-15 DIAGNOSIS — E1129 Type 2 diabetes mellitus with other diabetic kidney complication: Secondary | ICD-10-CM | POA: Diagnosis not present

## 2017-10-07 ENCOUNTER — Encounter: Payer: Self-pay | Admitting: *Deleted

## 2017-10-07 DIAGNOSIS — R0602 Shortness of breath: Secondary | ICD-10-CM

## 2017-10-07 HISTORY — DX: Shortness of breath: R06.02

## 2017-10-16 DIAGNOSIS — E1129 Type 2 diabetes mellitus with other diabetic kidney complication: Secondary | ICD-10-CM | POA: Diagnosis not present

## 2017-10-16 DIAGNOSIS — I482 Chronic atrial fibrillation: Secondary | ICD-10-CM | POA: Diagnosis not present

## 2017-10-16 DIAGNOSIS — R809 Proteinuria, unspecified: Secondary | ICD-10-CM | POA: Diagnosis not present

## 2017-10-16 DIAGNOSIS — Z7901 Long term (current) use of anticoagulants: Secondary | ICD-10-CM | POA: Diagnosis not present

## 2017-10-16 DIAGNOSIS — Z6839 Body mass index (BMI) 39.0-39.9, adult: Secondary | ICD-10-CM | POA: Diagnosis not present

## 2017-10-19 ENCOUNTER — Ambulatory Visit (INDEPENDENT_AMBULATORY_CARE_PROVIDER_SITE_OTHER): Payer: Medicare Other | Admitting: Cardiology

## 2017-10-19 ENCOUNTER — Encounter: Payer: Self-pay | Admitting: Cardiology

## 2017-10-19 ENCOUNTER — Encounter: Payer: Self-pay | Admitting: *Deleted

## 2017-10-19 VITALS — BP 130/78 | HR 89 | Ht 72.0 in | Wt 300.1 lb

## 2017-10-19 DIAGNOSIS — E7800 Pure hypercholesterolemia, unspecified: Secondary | ICD-10-CM

## 2017-10-19 DIAGNOSIS — R42 Dizziness and giddiness: Secondary | ICD-10-CM | POA: Insufficient documentation

## 2017-10-19 DIAGNOSIS — C3492 Malignant neoplasm of unspecified part of left bronchus or lung: Secondary | ICD-10-CM

## 2017-10-19 DIAGNOSIS — I25119 Atherosclerotic heart disease of native coronary artery with unspecified angina pectoris: Secondary | ICD-10-CM

## 2017-10-19 DIAGNOSIS — I10 Essential (primary) hypertension: Secondary | ICD-10-CM | POA: Diagnosis not present

## 2017-10-19 HISTORY — DX: Dizziness and giddiness: R42

## 2017-10-19 NOTE — Progress Notes (Signed)
Cardiology Office Note:    Date:  10/19/2017   ID:  Travis Watts [place], DOB 26-Jul-1945, MRN [id number]  PCP:  Cyndy Freeze, MD  Cardiologist:  [name], MD    Referring MD: Cyndy Freeze, MD   Chief Complaint  Patient presents with  . Follow-up  Doing well  History of Present Illness:    Travis Watts is a 72 y.o. male with multiple medical problems.  Last time I saw him he was tired exhausted and we did talk about potentially some psychiatric issue.  Now he is doing much better he got more energy he is able to do things at home.  Denies have any chest pain tightness squeezing pressure burning chest but described to have some dizziness which happens typically when he walks.  He then fell down few times because of that he does not completely passed out just goes down.  No palpitations no tightness squeezing pressure burning chest.  Past Medical History:  Diagnosis Date  . A-fib ([place])   . Anxiety   . Arthritis    previous to joint replacement - knees & shoulder.  Pt.'s R foot fused, traumatized from exposure to agent ORANGE  . Cancer ([place])    lung  . CHF (congestive heart failure) ([place])   . Coronary artery disease   . Depression   . Diabetes mellitus without complication ([place])    diagnosed- 53yrs.   Marland Kitchen Dyspnea   . Dysrhythmia   . GERD (gastroesophageal reflux disease)   . Hyperlipidemia   . Hypertension   . Neuromuscular disorder (HCC)    neuropathy- feet & legs  . Pneumonia    several times, but treated from his home, not hosp.   . Sleep apnea    not able to CPAP, since lung beiing removed- wife reports no problem.   . Stroke (cerebrum) [facility])    as a 72 y.o.     Past Surgical History:  Procedure Laterality Date  . ANKLE FUSION    . CARDIAC CATHETERIZATION    . EYE SURGERY Left    cataract removed   . JOINT REPLACEMENT Left 2016   shoulder replacement   . LUNG LOBECTOMY Left 2016   done at [place]    . SKIN  CANCER EXCISION      Current Medications: Current Meds  Medication Sig  . aspirin EC 81 MG tablet Take 81 mg by mouth daily.  Marland Kitchen atorvastatin (LIPITOR) 40 MG tablet Take 20 mg by mouth daily.  . celecoxib (CELEBREX) 200 MG capsule Take 200 mg by mouth 2 (two) times daily.  . Cholecalciferol (VITAMIN D) 2000 units tablet Take 2,000 Units by mouth daily.   . digoxin (LANOXIN) 0.125 MG tablet Take 0.125 mg by mouth at bedtime.   . DULoxetine (CYMBALTA) 60 MG capsule Take 60 mg by mouth daily.  . furosemide (LASIX) 80 MG tablet Take 80 mg by mouth 3 (three) times a week.   . gabapentin (NEURONTIN) 400 MG capsule Take 400-800 mg by mouth See admin instructions. Take 400 mg by mouth in the morning and take 800 mg by mouth at bedtime  . glipiZIDE (GLUCOTROL) 10 MG tablet Take 10 mg by mouth 2 (two) times daily.  Marland Kitchen lisinopril (PRINIVIL,ZESTRIL) 20 MG tablet Take 20 mg by mouth daily.  . magnesium oxide (MAG-OX) 400 MG tablet Take 400 mg by mouth daily.  . metFORMIN (GLUCOPHAGE) 1000 MG tablet Take 1,000 mg by mouth 2 (  two) times daily.  . metoprolol succinate (TOPROL-XL) 25 MG 24 hr tablet Take 25 mg by mouth daily.  Marland Kitchen omeprazole (PRILOSEC) 20 MG capsule Take 20 mg by mouth 2 (two) times daily.  . QUEtiapine (SEROQUEL) 100 MG tablet Take 150 mg by mouth at bedtime.   . solifenacin (VESICARE) 5 MG tablet Take 5 mg by mouth daily.   . tamsulosin (FLOMAX) 0.4 MG CAPS capsule Take 0.4 mg by mouth at bedtime.   Marland Kitchen warfarin (COUMADIN) 5 MG tablet Take 7.5 mg alt with 10 mg     Allergies:   Patient has no known allergies.   Social History   Socioeconomic History  . Marital status: Married    Spouse name: Not on file  . Number of children: Not on file  . Years of education: Not on file  . Highest education level: Not on file  Occupational History  . Not on file  Social Needs  . Financial resource strain: Not on file  . Food insecurity:    Worry: Not on file    Inability: Not on file  .  Transportation needs:    Medical: Not on file    Non-medical: Not on file  Tobacco Use  . Smoking status: Former Smoker    Last attempt to quit: 06/18/1994    Years since quitting: 23.3  . Smokeless tobacco: Never Used  Substance and Sexual Activity  . Alcohol use: No  . Drug use: No  . Sexual activity: Not on file  Lifestyle  . Physical activity:    Days per week: Not on file    Minutes per session: Not on file  . Stress: Not on file  Relationships  . Social connections:    Talks on phone: Not on file    Gets together: Not on file    Attends religious service: Not on file    Active member of club or organization: Not on file    Attends meetings of clubs or organizations: Not on file    Relationship status: Not on file  Other Topics Concern  . Not on file  Social History Narrative  . Not on file     Family History: The patient's family history includes Depression in his mother; Early death in his brother, father, maternal grandmother, and maternal uncle; Heart attack in his brother, father, and paternal uncle; Heart disease in his brother and father; Heart failure in his brother and father; Hyperlipidemia in his brother and father; Hypertension in his brother and father; Osteoarthritis in his father; Rheum arthritis in his father; Stroke in his maternal grandmother and maternal uncle. ROS:   Please see the history of present illness.    All 14 point review of systems negative except as described per history of present illness  EKGs/Labs/Other Studies Reviewed:      Recent Labs: 08/20/2017: BUN 19; Creatinine, Ser 1.01; Hemoglobin 16.1; NT-Pro BNP 241; Platelets 266; Potassium 4.4; Sodium 138  Recent Lipid Panel No results found for: CHOL, TRIG, HDL, CHOLHDL, VLDL, LDLCALC, LDLDIRECT  Physical Exam:    VS:  BP 130/78   Pulse 89   Ht 6' (1.829 m)   Wt (!) 300 lb 1.9 oz (136.1 kg)   SpO2 98%   BMI 40.70 kg/m     Wt Readings from Last 3 Encounters:  10/19/17 (!)  300 lb 1.9 oz (136.1 kg)  08/20/17 285 lb 12.8 oz (129.6 kg)  06/19/17 (!) 300 lb 7.8 oz (136.3 kg)  GEN:  Well nourished, well developed in no acute distress HEENT: Normal NECK: No JVD; No carotid bruits LYMPHATICS: No lymphadenopathy CARDIAC: RRR, no murmurs, no rubs, no gallops RESPIRATORY:  Clear to auscultation without rales, wheezing or rhonchi  ABDOMEN: Soft, non-tender, non-distended MUSCULOSKELETAL:  No edema; No deformity  SKIN: Warm and dry LOWER EXTREMITIES: no swelling NEUROLOGIC:  Alert and oriented x 3 PSYCHIATRIC:  Normal affect   ASSESSMENT:    1. Atherosclerosis of native coronary artery of native heart with angina pectoris ([place])   2. Essential hypertension   3. Small cell carcinoma of left lung ([place])   4. Hypercholesterolemia   5. Dizziness    PLAN:    In order of problems listed above:  1. Dizziness: I will ask him to wear 48 hours Holter monitor to make sure there is no arrhythmia. 2. Atherosclerosis with coronary artery disease: Asymptomatic doing well we will continue present management. 3. Essential hypertension: Blood pressure well controlled continue present management. 4. Small cell carcinoma of the lung stable. Dyslipidemia: We will call primary care physician to get fasting lipid profile  I see him back in my office in about   Medication Adjustments/Labs and Tests Ordered: Current medicines are reviewed at length with the patient today.  Concerns regarding medicines are outlined above.  No orders of the defined types were placed in this encounter.  Medication changes: No orders of the defined types were placed in this encounter.   Signed, Park Liter, MD, [facility] 10/19/2017 10:59 AM    [place]

## 2017-10-19 NOTE — Patient Instructions (Signed)
Medication Instructions:  Your physician recommends that you continue on your current medications as directed. Please refer to the Current Medication list given to you today.  Labwork: None ordered  Testing/Procedures: Your physician has recommended that you wear a holter monitor. Holter monitors are medical devices that record the heart's electrical activity. Doctors most often use these monitors to diagnose arrhythmias. Arrhythmias are problems with the speed or rhythm of the heartbeat. The monitor is a small, portable device. You can wear one while you do your normal daily activities. This is usually used to diagnose what is causing palpitations/syncope (passing out).  Follow-Up: Your physician recommends that you schedule a follow-up appointment in: 3 months with Dr. Agustin Cree   Any Other Special Instructions Will Be Listed Below (If Applicable).     If you need a refill on your cardiac medications before your next appointment, please call your pharmacy.

## 2017-10-19 NOTE — Addendum Note (Signed)
Addended by: Aleatha Borer on: 10/19/2017 11:03 AM   Modules accepted: Orders

## 2017-10-22 ENCOUNTER — Ambulatory Visit: Payer: Medicare Other

## 2017-10-22 DIAGNOSIS — R42 Dizziness and giddiness: Secondary | ICD-10-CM | POA: Diagnosis not present

## 2017-10-22 DIAGNOSIS — I482 Chronic atrial fibrillation: Secondary | ICD-10-CM | POA: Diagnosis not present

## 2017-10-30 DIAGNOSIS — I10 Essential (primary) hypertension: Secondary | ICD-10-CM | POA: Diagnosis not present

## 2017-10-30 DIAGNOSIS — M4316 Spondylolisthesis, lumbar region: Secondary | ICD-10-CM | POA: Diagnosis not present

## 2017-10-30 DIAGNOSIS — Z6841 Body Mass Index (BMI) 40.0 and over, adult: Secondary | ICD-10-CM | POA: Diagnosis not present

## 2017-11-10 DIAGNOSIS — J449 Chronic obstructive pulmonary disease, unspecified: Secondary | ICD-10-CM | POA: Diagnosis not present

## 2017-11-30 DIAGNOSIS — J449 Chronic obstructive pulmonary disease, unspecified: Secondary | ICD-10-CM | POA: Diagnosis not present

## 2017-11-30 DIAGNOSIS — G4733 Obstructive sleep apnea (adult) (pediatric): Secondary | ICD-10-CM | POA: Diagnosis not present

## 2017-12-15 DIAGNOSIS — J449 Chronic obstructive pulmonary disease, unspecified: Secondary | ICD-10-CM | POA: Diagnosis not present

## 2017-12-16 DIAGNOSIS — J449 Chronic obstructive pulmonary disease, unspecified: Secondary | ICD-10-CM | POA: Diagnosis not present

## 2017-12-18 DIAGNOSIS — J449 Chronic obstructive pulmonary disease, unspecified: Secondary | ICD-10-CM | POA: Diagnosis not present

## 2017-12-28 DIAGNOSIS — J449 Chronic obstructive pulmonary disease, unspecified: Secondary | ICD-10-CM | POA: Diagnosis not present

## 2017-12-30 DIAGNOSIS — J449 Chronic obstructive pulmonary disease, unspecified: Secondary | ICD-10-CM | POA: Diagnosis not present

## 2018-01-27 DIAGNOSIS — I509 Heart failure, unspecified: Secondary | ICD-10-CM | POA: Diagnosis not present

## 2018-01-27 DIAGNOSIS — E109 Type 1 diabetes mellitus without complications: Secondary | ICD-10-CM | POA: Diagnosis not present

## 2018-01-27 DIAGNOSIS — F331 Major depressive disorder, recurrent, moderate: Secondary | ICD-10-CM | POA: Diagnosis not present

## 2018-01-27 DIAGNOSIS — R5383 Other fatigue: Secondary | ICD-10-CM | POA: Diagnosis not present

## 2018-01-27 DIAGNOSIS — E039 Hypothyroidism, unspecified: Secondary | ICD-10-CM | POA: Diagnosis not present

## 2018-01-27 DIAGNOSIS — E1142 Type 2 diabetes mellitus with diabetic polyneuropathy: Secondary | ICD-10-CM | POA: Diagnosis not present

## 2018-01-27 DIAGNOSIS — N410 Acute prostatitis: Secondary | ICD-10-CM | POA: Diagnosis not present

## 2018-01-27 DIAGNOSIS — I10 Essential (primary) hypertension: Secondary | ICD-10-CM | POA: Diagnosis not present

## 2018-02-16 DIAGNOSIS — C3412 Malignant neoplasm of upper lobe, left bronchus or lung: Secondary | ICD-10-CM | POA: Diagnosis not present

## 2018-02-16 DIAGNOSIS — R5383 Other fatigue: Secondary | ICD-10-CM | POA: Diagnosis not present

## 2018-02-16 DIAGNOSIS — R5381 Other malaise: Secondary | ICD-10-CM | POA: Diagnosis not present

## 2018-02-16 DIAGNOSIS — R05 Cough: Secondary | ICD-10-CM | POA: Diagnosis not present

## 2018-02-16 DIAGNOSIS — R918 Other nonspecific abnormal finding of lung field: Secondary | ICD-10-CM | POA: Diagnosis not present

## 2018-02-16 DIAGNOSIS — R0602 Shortness of breath: Secondary | ICD-10-CM | POA: Diagnosis not present

## 2018-02-16 DIAGNOSIS — I2510 Atherosclerotic heart disease of native coronary artery without angina pectoris: Secondary | ICD-10-CM | POA: Diagnosis not present

## 2018-02-16 DIAGNOSIS — C3432 Malignant neoplasm of lower lobe, left bronchus or lung: Secondary | ICD-10-CM | POA: Diagnosis not present

## 2018-02-16 DIAGNOSIS — C3490 Malignant neoplasm of unspecified part of unspecified bronchus or lung: Secondary | ICD-10-CM | POA: Diagnosis not present

## 2018-03-05 DIAGNOSIS — F3342 Major depressive disorder, recurrent, in full remission: Secondary | ICD-10-CM | POA: Diagnosis not present

## 2018-03-05 DIAGNOSIS — I5032 Chronic diastolic (congestive) heart failure: Secondary | ICD-10-CM

## 2018-03-05 DIAGNOSIS — E039 Hypothyroidism, unspecified: Secondary | ICD-10-CM | POA: Diagnosis not present

## 2018-03-05 DIAGNOSIS — I5042 Chronic combined systolic (congestive) and diastolic (congestive) heart failure: Secondary | ICD-10-CM | POA: Diagnosis not present

## 2018-03-05 DIAGNOSIS — Z6841 Body Mass Index (BMI) 40.0 and over, adult: Secondary | ICD-10-CM | POA: Diagnosis not present

## 2018-03-05 DIAGNOSIS — I10 Essential (primary) hypertension: Secondary | ICD-10-CM | POA: Diagnosis not present

## 2018-03-05 DIAGNOSIS — E1142 Type 2 diabetes mellitus with diabetic polyneuropathy: Secondary | ICD-10-CM | POA: Insufficient documentation

## 2018-03-05 HISTORY — DX: Chronic diastolic (congestive) heart failure: I50.32

## 2018-03-05 HISTORY — DX: Type 2 diabetes mellitus with diabetic polyneuropathy: E11.42

## 2018-03-05 HISTORY — DX: Morbid (severe) obesity due to excess calories: E66.01

## 2018-03-17 DIAGNOSIS — G4733 Obstructive sleep apnea (adult) (pediatric): Secondary | ICD-10-CM | POA: Diagnosis not present

## 2018-03-17 DIAGNOSIS — Z902 Acquired absence of lung [part of]: Secondary | ICD-10-CM

## 2018-03-17 DIAGNOSIS — I5032 Chronic diastolic (congestive) heart failure: Secondary | ICD-10-CM | POA: Diagnosis not present

## 2018-03-17 DIAGNOSIS — R918 Other nonspecific abnormal finding of lung field: Secondary | ICD-10-CM | POA: Diagnosis not present

## 2018-03-17 DIAGNOSIS — Z7982 Long term (current) use of aspirin: Secondary | ICD-10-CM | POA: Diagnosis not present

## 2018-03-17 DIAGNOSIS — R0609 Other forms of dyspnea: Secondary | ICD-10-CM | POA: Diagnosis not present

## 2018-03-17 DIAGNOSIS — C3492 Malignant neoplasm of unspecified part of left bronchus or lung: Secondary | ICD-10-CM | POA: Diagnosis not present

## 2018-03-17 DIAGNOSIS — Z87891 Personal history of nicotine dependence: Secondary | ICD-10-CM | POA: Diagnosis not present

## 2018-03-17 DIAGNOSIS — Z9889 Other specified postprocedural states: Secondary | ICD-10-CM | POA: Insufficient documentation

## 2018-03-17 DIAGNOSIS — Z23 Encounter for immunization: Secondary | ICD-10-CM | POA: Diagnosis not present

## 2018-03-17 HISTORY — DX: Other specified postprocedural states: Z90.2

## 2018-03-17 HISTORY — DX: Acquired absence of lung (part of): Z98.890

## 2018-03-24 DIAGNOSIS — R911 Solitary pulmonary nodule: Secondary | ICD-10-CM | POA: Diagnosis not present

## 2018-03-24 DIAGNOSIS — R0609 Other forms of dyspnea: Secondary | ICD-10-CM | POA: Diagnosis not present

## 2018-03-29 DIAGNOSIS — J984 Other disorders of lung: Secondary | ICD-10-CM | POA: Diagnosis not present

## 2018-03-29 DIAGNOSIS — R0609 Other forms of dyspnea: Secondary | ICD-10-CM | POA: Diagnosis not present

## 2018-04-05 ENCOUNTER — Ambulatory Visit (INDEPENDENT_AMBULATORY_CARE_PROVIDER_SITE_OTHER): Payer: Medicare Other | Admitting: Cardiology

## 2018-04-05 ENCOUNTER — Encounter: Payer: Self-pay | Admitting: Cardiology

## 2018-04-05 VITALS — BP 146/78 | HR 108 | Ht 72.0 in | Wt 303.0 lb

## 2018-04-05 DIAGNOSIS — I10 Essential (primary) hypertension: Secondary | ICD-10-CM | POA: Diagnosis not present

## 2018-04-05 DIAGNOSIS — I482 Chronic atrial fibrillation, unspecified: Secondary | ICD-10-CM | POA: Diagnosis not present

## 2018-04-05 DIAGNOSIS — I2510 Atherosclerotic heart disease of native coronary artery without angina pectoris: Secondary | ICD-10-CM

## 2018-04-05 DIAGNOSIS — C3492 Malignant neoplasm of unspecified part of left bronchus or lung: Secondary | ICD-10-CM | POA: Diagnosis not present

## 2018-04-05 NOTE — Progress Notes (Signed)
Cardiology Office Note:    Date:  04/05/2018   ID:  Travis Watts, DOB 1946-01-20, MRN [id number]  PCP:  Cyndy Freeze, MD  Cardiologist:  [name], MD    Referring MD: Cyndy Freeze, MD   Chief Complaint  Patient presents with  . Follow-up  Doing well still some short of breath  History of Present Illness:    Travis Watts is a 72 y.o. male with multiple medical problems which include lung cancer COPD chronic atrial fibrillation diastolic congestive heart failure comes today to have some follow-up it is good and he feels good he is also very optimistic last time of sinking feeling very depressed but today he is cheerful he jokes he is happy.  He sign off with pulmonary rehab which I think it very beneficial for him.  Past Medical History:  Diagnosis Date  . A-fib ([place])   . Anxiety   . Arthritis    previous to joint replacement - knees & shoulder.  Pt.'s R foot fused, traumatized from exposure to agent ORANGE  . Cancer ([place])    lung  . CHF (congestive heart failure) ([place])   . Coronary artery disease   . Depression   . Diabetes mellitus without complication ([place])    diagnosed- 74yrs.   Marland Kitchen Dyspnea   . Dysrhythmia   . GERD (gastroesophageal reflux disease)   . Hyperlipidemia   . Hypertension   . Neuromuscular disorder (HCC)    neuropathy- feet & legs  . Pneumonia    several times, but treated from his home, not hosp.   . Sleep apnea    not able to CPAP, since lung beiing removed- wife reports no problem.   . Stroke (cerebrum) [facility])    as a 72 y.o.     Past Surgical History:  Procedure Laterality Date  . ANKLE FUSION    . CARDIAC CATHETERIZATION    . EYE SURGERY Left    cataract removed   . JOINT REPLACEMENT Left 2016   shoulder replacement   . LUNG LOBECTOMY Left 2016   done at [place]    . SKIN CANCER EXCISION      Current Medications: Current Meds  Medication Sig  . aspirin EC 81 MG tablet Take  81 mg by mouth daily.  Marland Kitchen atorvastatin (LIPITOR) 40 MG tablet Take 20 mg by mouth daily.  . celecoxib (CELEBREX) 200 MG capsule Take 200 mg by mouth 2 (two) times daily.  . Cholecalciferol (VITAMIN D) 2000 units tablet Take 2,000 Units by mouth daily.   . digoxin (LANOXIN) 0.125 MG tablet Take 0.125 mg by mouth at bedtime.   . DULoxetine (CYMBALTA) 60 MG capsule Take 60 mg by mouth daily.  . furosemide (LASIX) 80 MG tablet Take 80 mg by mouth 3 (three) times a week.   . gabapentin (NEURONTIN) 400 MG capsule Take 400-800 mg by mouth See admin instructions. Take 400 mg by mouth in the morning and take 800 mg by mouth at bedtime  . glipiZIDE (GLUCOTROL) 10 MG tablet Take 10 mg by mouth 2 (two) times daily.  Marland Kitchen lisinopril (PRINIVIL,ZESTRIL) 20 MG tablet Take 20 mg by mouth daily.  . magnesium oxide (MAG-OX) 400 MG tablet Take 400 mg by mouth daily.  . metFORMIN (GLUCOPHAGE) 1000 MG tablet Take 1,000 mg by mouth 2 (two) times daily.  . metoprolol succinate (TOPROL-XL) 25 MG 24 hr tablet Take 25 mg by mouth daily.  Marland Kitchen omeprazole (  PRILOSEC) 20 MG capsule Take 20 mg by mouth 2 (two) times daily.  . QUEtiapine (SEROQUEL) 100 MG tablet Take 150 mg by mouth at bedtime.   . solifenacin (VESICARE) 5 MG tablet Take 5 mg by mouth daily.   . tamsulosin (FLOMAX) 0.4 MG CAPS capsule Take 0.4 mg by mouth at bedtime.   Marland Kitchen warfarin (COUMADIN) 5 MG tablet Take 7.5 mg alt with 10 mg     Allergies:   Patient has no known allergies.   Social History   Socioeconomic History  . Marital status: Married    Spouse name: Not on file  . Number of children: Not on file  . Years of education: Not on file  . Highest education level: Not on file  Occupational History  . Not on file  Social Needs  . Financial resource strain: Not on file  . Food insecurity:    Worry: Not on file    Inability: Not on file  . Transportation needs:    Medical: Not on file    Non-medical: Not on file  Tobacco Use  . Smoking status:  Former Smoker    Last attempt to quit: 06/18/1994    Years since quitting: 23.8  . Smokeless tobacco: Never Used  Substance and Sexual Activity  . Alcohol use: No  . Drug use: No  . Sexual activity: Not on file  Lifestyle  . Physical activity:    Days per week: Not on file    Minutes per session: Not on file  . Stress: Not on file  Relationships  . Social connections:    Talks on phone: Not on file    Gets together: Not on file    Attends religious service: Not on file    Active member of club or organization: Not on file    Attends meetings of clubs or organizations: Not on file    Relationship status: Not on file  Other Topics Concern  . Not on file  Social History Narrative  . Not on file     Family History: The patient's family history includes Depression in his mother; Early death in his brother, father, maternal grandmother, and maternal uncle; Heart attack in his brother, father, and paternal uncle; Heart disease in his brother and father; Heart failure in his brother and father; Hyperlipidemia in his brother and father; Hypertension in his brother and father; Osteoarthritis in his father; Rheum arthritis in his father; Stroke in his maternal grandmother and maternal uncle. ROS:   Please see the history of present illness.    All 14 point review of systems negative except as described per history of present illness  EKGs/Labs/Other Studies Reviewed:      Recent Labs: 08/20/2017: BUN 19; Creatinine, Ser 1.01; Hemoglobin 16.1; NT-Pro BNP 241; Platelets 266; Potassium 4.4; Sodium 138  Recent Lipid Panel No results found for: CHOL, TRIG, HDL, CHOLHDL, VLDL, LDLCALC, LDLDIRECT  Physical Exam:    VS:  BP (!) 146/78   Pulse (!) 108   Ht 6' (1.829 m)   Wt (!) 303 lb (137.4 kg)   SpO2 98%   BMI 41.09 kg/m     Wt Readings from Last 3 Encounters:  04/05/18 (!) 303 lb (137.4 kg)  10/19/17 (!) 300 lb 1.9 oz (136.1 kg)  08/20/17 285 lb 12.8 oz (129.6 kg)     GEN:   Well nourished, well developed in no acute distress HEENT: Normal NECK: No JVD; No carotid bruits LYMPHATICS: No lymphadenopathy CARDIAC: Irregularly irregular,  no murmurs, no rubs, no gallops RESPIRATORY:  Clear to auscultation without rales, wheezing or rhonchi  ABDOMEN: Soft, non-tender, non-distended MUSCULOSKELETAL:  No edema; No deformity  SKIN: Warm and dry LOWER EXTREMITIES: no swelling NEUROLOGIC:  Alert and oriented x 3 PSYCHIATRIC:  Normal affect   ASSESSMENT:    1. Chronic atrial fibrillation   2. Coronary artery disease involving native coronary artery of native heart without angina pectoris   3. Essential hypertension   4. Small cell carcinoma of left lung (HCC)    PLAN:    In order of problems listed above:  1. Chronic atrial fibrillation anticoagulated which I will continue.  Rate controlled we will continue present management I will ask him to have echocardiogram to assess left ventricular ejection fraction 2. Coronary artery disease stable on appropriate medications. Essential hypertension blood pressure controlled continue present management. Small cell CA follow-up by oncology team.  Overall he is doing well I will ask him to have echocardiogram to reassess left ventricular ejection fraction   Medication Adjustments/Labs and Tests Ordered: Current medicines are reviewed at length with the patient today.  Concerns regarding medicines are outlined above.  No orders of the defined types were placed in this encounter.  Medication changes: No orders of the defined types were placed in this encounter.   Signed, Park Liter, MD, [facility] 04/05/2018 10:15 AM    [place]

## 2018-04-05 NOTE — Patient Instructions (Signed)
Medication Instructions:  Your physician recommends that you continue on your current medications as directed. Please refer to the Current Medication list given to you today.  If you need a refill on your cardiac medications before your next appointment, please call your pharmacy.   Lab work: None.  If you have labs (blood work) drawn today and your tests are completely normal, you will receive your results only by: Marland Kitchen MyChart Message (if you have MyChart) OR . A paper copy in the mail If you have any lab test that is abnormal or we need to change your treatment, we will call you to review the results.  Testing/Procedures: Your physician has requested that you have an echocardiogram. Echocardiography is a painless test that uses sound waves to create images of your heart. It provides your doctor with information about the size and shape of your heart and how well your heart's chambers and valves are working. This procedure takes approximately one hour. There are no restrictions for this procedure.    Follow-Up: At [facility], you and your health needs are our priority.  As part of our continuing mission to provide you with exceptional heart care, we have created designated Provider Care Teams.  These Care Teams include your primary Cardiologist (physician) and Advanced Practice Providers (APPs -  Physician Assistants and Nurse Practitioners) who all work together to provide you with the care you need, when you need it. You will need a follow up appointment in 5 months.  Please call our office 2 months in advance to schedule this appointment.  You may see [name], MD or another member of our [place] Provider Team in [place]: Shirlee More, MD . Jyl Heinz, MD  Any Other Special Instructions Will Be Listed Below (If Applicable).  Echocardiogram An echocardiogram, or echocardiography, uses sound waves (ultrasound) to produce an image of your heart. The echocardiogram is  simple, painless, obtained within a short period of time, and offers valuable information to your health care provider. The images from an echocardiogram can provide information such as:  Evidence of coronary artery disease (CAD).  Heart size.  Heart muscle function.  Heart valve function.  Aneurysm detection.  Evidence of a past heart attack.  Fluid buildup around the heart.  Heart muscle thickening.  Assess heart valve function.  Tell a health care provider about:  Any allergies you have.  All medicines you are taking, including vitamins, herbs, eye drops, creams, and over-the-counter medicines.  Any problems you or family members have had with anesthetic medicines.  Any blood disorders you have.  Any surgeries you have had.  Any medical conditions you have.  Whether you are pregnant or may be pregnant. What happens before the procedure? No special preparation is needed. Eat and drink normally. What happens during the procedure?  In order to produce an image of your heart, gel will be applied to your chest and a wand-like tool (transducer) will be moved over your chest. The gel will help transmit the sound waves from the transducer. The sound waves will harmlessly bounce off your heart to allow the heart images to be captured in real-time motion. These images will then be recorded.  You may need an IV to receive a medicine that improves the quality of the pictures. What happens after the procedure? You may return to your normal schedule including diet, activities, and medicines, unless your health care provider tells you otherwise. This information is not intended to replace advice given to you  by your health care provider. Make sure you discuss any questions you have with your health care provider. Document Released: 06/06/2000 Document Revised: 01/26/2016 Document Reviewed: 02/14/2013 Elsevier Interactive Patient Education  2017 [employer].

## 2018-04-05 NOTE — Addendum Note (Signed)
Addended by: Ashok Norris on: 04/05/2018 10:21 AM   Modules accepted: Orders

## 2018-04-06 DIAGNOSIS — J984 Other disorders of lung: Secondary | ICD-10-CM | POA: Diagnosis not present

## 2018-04-06 DIAGNOSIS — R0609 Other forms of dyspnea: Secondary | ICD-10-CM | POA: Diagnosis not present

## 2018-04-07 ENCOUNTER — Other Ambulatory Visit: Payer: Self-pay | Admitting: Cardiology

## 2018-04-08 DIAGNOSIS — R0609 Other forms of dyspnea: Secondary | ICD-10-CM | POA: Diagnosis not present

## 2018-04-08 DIAGNOSIS — J984 Other disorders of lung: Secondary | ICD-10-CM | POA: Diagnosis not present

## 2018-04-08 NOTE — Telephone Encounter (Signed)
Attempted to contact patient regarding coumadin. Unsuccessful in reaching the patient so far

## 2018-04-08 NOTE — Telephone Encounter (Signed)
Hayley do you know if we are assuming this pt's coumadin? I cannot find in the chart where we have been managing it? He was just seen on the 14th.

## 2018-04-09 NOTE — Telephone Encounter (Signed)
Attempted to contact patient, unable to leave message. Will continue efforts.

## 2018-04-12 NOTE — Telephone Encounter (Signed)
Travis Watts is this a patient who you may be familiar with?

## 2018-04-13 DIAGNOSIS — J984 Other disorders of lung: Secondary | ICD-10-CM | POA: Diagnosis not present

## 2018-04-13 DIAGNOSIS — R0609 Other forms of dyspnea: Secondary | ICD-10-CM | POA: Diagnosis not present

## 2018-04-13 NOTE — Telephone Encounter (Signed)
I am not familiar with him. We have not seen him for anticoagulation. If we are assuming management please let me know so that we can get him set up for follow up.

## 2018-04-14 ENCOUNTER — Other Ambulatory Visit: Payer: Self-pay | Admitting: Emergency Medicine

## 2018-04-14 DIAGNOSIS — Z029 Encounter for administrative examinations, unspecified: Secondary | ICD-10-CM | POA: Diagnosis not present

## 2018-04-14 NOTE — Telephone Encounter (Signed)
Spoke with patient's wife regarding this. She reports that his coumadin is managed by Dr. West Pugh and that he handles his refills etc. No need for [place] to refill

## 2018-04-14 NOTE — Telephone Encounter (Signed)
This is what I sent K FYI

## 2018-04-14 NOTE — Telephone Encounter (Signed)
Dr. Raliegh Ip are we assuming this patient's coumadin??? I do not see where we have ever checked him previously?

## 2018-04-15 DIAGNOSIS — J984 Other disorders of lung: Secondary | ICD-10-CM | POA: Diagnosis not present

## 2018-04-15 DIAGNOSIS — R0609 Other forms of dyspnea: Secondary | ICD-10-CM | POA: Diagnosis not present

## 2018-04-20 DIAGNOSIS — R0609 Other forms of dyspnea: Secondary | ICD-10-CM | POA: Diagnosis not present

## 2018-04-20 DIAGNOSIS — J984 Other disorders of lung: Secondary | ICD-10-CM | POA: Diagnosis not present

## 2018-04-22 DIAGNOSIS — R0609 Other forms of dyspnea: Secondary | ICD-10-CM | POA: Diagnosis not present

## 2018-04-22 DIAGNOSIS — J984 Other disorders of lung: Secondary | ICD-10-CM | POA: Diagnosis not present

## 2018-04-23 ENCOUNTER — Other Ambulatory Visit: Payer: Self-pay | Admitting: Cardiology

## 2018-04-23 NOTE — Telephone Encounter (Signed)
Needs refill please.

## 2018-04-27 DIAGNOSIS — R0609 Other forms of dyspnea: Secondary | ICD-10-CM | POA: Diagnosis not present

## 2018-04-27 DIAGNOSIS — J984 Other disorders of lung: Secondary | ICD-10-CM | POA: Diagnosis not present

## 2018-04-29 DIAGNOSIS — R0609 Other forms of dyspnea: Secondary | ICD-10-CM | POA: Diagnosis not present

## 2018-04-29 DIAGNOSIS — J984 Other disorders of lung: Secondary | ICD-10-CM | POA: Diagnosis not present

## 2018-05-04 DIAGNOSIS — J984 Other disorders of lung: Secondary | ICD-10-CM | POA: Diagnosis not present

## 2018-05-04 DIAGNOSIS — R0609 Other forms of dyspnea: Secondary | ICD-10-CM | POA: Diagnosis not present

## 2018-05-11 DIAGNOSIS — R0609 Other forms of dyspnea: Secondary | ICD-10-CM | POA: Diagnosis not present

## 2018-05-11 DIAGNOSIS — J984 Other disorders of lung: Secondary | ICD-10-CM | POA: Diagnosis not present

## 2018-05-13 DIAGNOSIS — R0609 Other forms of dyspnea: Secondary | ICD-10-CM | POA: Diagnosis not present

## 2018-05-13 DIAGNOSIS — J984 Other disorders of lung: Secondary | ICD-10-CM | POA: Diagnosis not present

## 2018-05-18 DIAGNOSIS — I10 Essential (primary) hypertension: Secondary | ICD-10-CM | POA: Diagnosis not present

## 2018-05-18 DIAGNOSIS — I5042 Chronic combined systolic (congestive) and diastolic (congestive) heart failure: Secondary | ICD-10-CM | POA: Diagnosis not present

## 2018-05-18 DIAGNOSIS — J181 Lobar pneumonia, unspecified organism: Secondary | ICD-10-CM | POA: Diagnosis not present

## 2018-05-18 DIAGNOSIS — F3342 Major depressive disorder, recurrent, in full remission: Secondary | ICD-10-CM | POA: Diagnosis not present

## 2018-05-25 ENCOUNTER — Other Ambulatory Visit: Payer: Medicare Other

## 2018-05-25 DIAGNOSIS — Z7901 Long term (current) use of anticoagulants: Secondary | ICD-10-CM | POA: Diagnosis not present

## 2018-05-25 DIAGNOSIS — J189 Pneumonia, unspecified organism: Secondary | ICD-10-CM | POA: Diagnosis not present

## 2018-06-04 DIAGNOSIS — R61 Generalized hyperhidrosis: Secondary | ICD-10-CM | POA: Diagnosis not present

## 2018-06-04 DIAGNOSIS — E039 Hypothyroidism, unspecified: Secondary | ICD-10-CM | POA: Diagnosis not present

## 2018-06-04 DIAGNOSIS — E1142 Type 2 diabetes mellitus with diabetic polyneuropathy: Secondary | ICD-10-CM | POA: Diagnosis not present

## 2018-06-04 DIAGNOSIS — F3342 Major depressive disorder, recurrent, in full remission: Secondary | ICD-10-CM | POA: Diagnosis not present

## 2018-06-04 DIAGNOSIS — I4811 Longstanding persistent atrial fibrillation: Secondary | ICD-10-CM | POA: Diagnosis not present

## 2018-06-04 DIAGNOSIS — Z7901 Long term (current) use of anticoagulants: Secondary | ICD-10-CM | POA: Diagnosis not present

## 2018-06-04 DIAGNOSIS — I482 Chronic atrial fibrillation, unspecified: Secondary | ICD-10-CM | POA: Diagnosis not present

## 2018-06-04 DIAGNOSIS — I10 Essential (primary) hypertension: Secondary | ICD-10-CM | POA: Diagnosis not present

## 2018-06-28 DIAGNOSIS — R918 Other nonspecific abnormal finding of lung field: Secondary | ICD-10-CM | POA: Diagnosis not present

## 2018-07-06 ENCOUNTER — Ambulatory Visit (INDEPENDENT_AMBULATORY_CARE_PROVIDER_SITE_OTHER): Payer: Medicare Other

## 2018-07-06 DIAGNOSIS — I2510 Atherosclerotic heart disease of native coronary artery without angina pectoris: Secondary | ICD-10-CM

## 2018-07-06 DIAGNOSIS — I482 Chronic atrial fibrillation, unspecified: Secondary | ICD-10-CM

## 2018-07-06 MED ORDER — PERFLUTREN LIPID MICROSPHERE: 1.0000 mL | INTRAVENOUS | Status: AC | PRN

## 2018-07-06 NOTE — Progress Notes (Signed)
Complete echocardiogram with contrast has been performed.   Jimmy Makaia Rappa RDCS, RVT 

## 2018-07-15 DIAGNOSIS — J189 Pneumonia, unspecified organism: Secondary | ICD-10-CM | POA: Diagnosis not present

## 2018-07-21 ENCOUNTER — Other Ambulatory Visit: Payer: Self-pay | Admitting: Cardiology

## 2018-07-22 DIAGNOSIS — Z902 Acquired absence of lung [part of]: Secondary | ICD-10-CM | POA: Diagnosis not present

## 2018-07-22 DIAGNOSIS — Z6841 Body Mass Index (BMI) 40.0 and over, adult: Secondary | ICD-10-CM | POA: Diagnosis not present

## 2018-07-22 DIAGNOSIS — Z79899 Other long term (current) drug therapy: Secondary | ICD-10-CM | POA: Diagnosis not present

## 2018-07-22 DIAGNOSIS — Z8701 Personal history of pneumonia (recurrent): Secondary | ICD-10-CM | POA: Diagnosis not present

## 2018-07-22 DIAGNOSIS — E785 Hyperlipidemia, unspecified: Secondary | ICD-10-CM | POA: Diagnosis not present

## 2018-07-22 DIAGNOSIS — J189 Pneumonia, unspecified organism: Secondary | ICD-10-CM | POA: Diagnosis not present

## 2018-07-22 DIAGNOSIS — E1142 Type 2 diabetes mellitus with diabetic polyneuropathy: Secondary | ICD-10-CM | POA: Diagnosis not present

## 2018-07-22 DIAGNOSIS — R6510 Systemic inflammatory response syndrome (SIRS) of non-infectious origin without acute organ dysfunction: Secondary | ICD-10-CM | POA: Diagnosis not present

## 2018-07-22 DIAGNOSIS — E039 Hypothyroidism, unspecified: Secondary | ICD-10-CM | POA: Diagnosis not present

## 2018-07-22 DIAGNOSIS — I110 Hypertensive heart disease with heart failure: Secondary | ICD-10-CM | POA: Diagnosis not present

## 2018-07-22 DIAGNOSIS — I5032 Chronic diastolic (congestive) heart failure: Secondary | ICD-10-CM | POA: Diagnosis not present

## 2018-07-22 DIAGNOSIS — A419 Sepsis, unspecified organism: Secondary | ICD-10-CM | POA: Diagnosis not present

## 2018-07-22 DIAGNOSIS — I4891 Unspecified atrial fibrillation: Secondary | ICD-10-CM | POA: Diagnosis not present

## 2018-07-22 DIAGNOSIS — Z7984 Long term (current) use of oral hypoglycemic drugs: Secondary | ICD-10-CM | POA: Diagnosis not present

## 2018-07-22 DIAGNOSIS — E871 Hypo-osmolality and hyponatremia: Secondary | ICD-10-CM | POA: Diagnosis not present

## 2018-07-22 DIAGNOSIS — Z96612 Presence of left artificial shoulder joint: Secondary | ICD-10-CM | POA: Diagnosis not present

## 2018-07-22 DIAGNOSIS — E872 Acidosis: Secondary | ICD-10-CM | POA: Diagnosis not present

## 2018-07-22 DIAGNOSIS — E860 Dehydration: Secondary | ICD-10-CM | POA: Diagnosis not present

## 2018-07-22 DIAGNOSIS — Z7901 Long term (current) use of anticoagulants: Secondary | ICD-10-CM | POA: Diagnosis not present

## 2018-07-23 DIAGNOSIS — Z85118 Personal history of other malignant neoplasm of bronchus and lung: Secondary | ICD-10-CM | POA: Diagnosis not present

## 2018-07-23 DIAGNOSIS — E871 Hypo-osmolality and hyponatremia: Secondary | ICD-10-CM | POA: Diagnosis not present

## 2018-07-23 DIAGNOSIS — E872 Acidosis: Secondary | ICD-10-CM | POA: Diagnosis not present

## 2018-07-23 DIAGNOSIS — R0602 Shortness of breath: Secondary | ICD-10-CM | POA: Diagnosis not present

## 2018-07-23 DIAGNOSIS — J90 Pleural effusion, not elsewhere classified: Secondary | ICD-10-CM | POA: Diagnosis not present

## 2018-07-23 DIAGNOSIS — D72829 Elevated white blood cell count, unspecified: Secondary | ICD-10-CM | POA: Diagnosis not present

## 2018-07-27 DIAGNOSIS — Z7901 Long term (current) use of anticoagulants: Secondary | ICD-10-CM | POA: Insufficient documentation

## 2018-07-27 DIAGNOSIS — Z6841 Body Mass Index (BMI) 40.0 and over, adult: Secondary | ICD-10-CM | POA: Diagnosis not present

## 2018-07-27 DIAGNOSIS — R05 Cough: Secondary | ICD-10-CM | POA: Diagnosis not present

## 2018-07-27 DIAGNOSIS — J441 Chronic obstructive pulmonary disease with (acute) exacerbation: Secondary | ICD-10-CM | POA: Diagnosis not present

## 2018-07-27 DIAGNOSIS — E871 Hypo-osmolality and hyponatremia: Secondary | ICD-10-CM | POA: Diagnosis not present

## 2018-07-27 DIAGNOSIS — E860 Dehydration: Secondary | ICD-10-CM | POA: Diagnosis not present

## 2018-07-27 DIAGNOSIS — E872 Acidosis: Secondary | ICD-10-CM | POA: Diagnosis not present

## 2018-07-27 DIAGNOSIS — R0600 Dyspnea, unspecified: Secondary | ICD-10-CM

## 2018-07-27 DIAGNOSIS — F05 Delirium due to known physiological condition: Secondary | ICD-10-CM | POA: Diagnosis not present

## 2018-07-27 DIAGNOSIS — R9389 Abnormal findings on diagnostic imaging of other specified body structures: Secondary | ICD-10-CM | POA: Diagnosis not present

## 2018-07-27 DIAGNOSIS — I5032 Chronic diastolic (congestive) heart failure: Secondary | ICD-10-CM | POA: Diagnosis not present

## 2018-07-27 DIAGNOSIS — R6510 Systemic inflammatory response syndrome (SIRS) of non-infectious origin without acute organ dysfunction: Secondary | ICD-10-CM | POA: Diagnosis not present

## 2018-07-27 DIAGNOSIS — R062 Wheezing: Secondary | ICD-10-CM | POA: Diagnosis not present

## 2018-07-27 DIAGNOSIS — R0609 Other forms of dyspnea: Secondary | ICD-10-CM

## 2018-07-27 HISTORY — DX: Dyspnea, unspecified: R06.00

## 2018-07-27 HISTORY — DX: Long term (current) use of anticoagulants: Z79.01

## 2018-07-27 HISTORY — DX: Other forms of dyspnea: R06.09

## 2018-07-28 DIAGNOSIS — D509 Iron deficiency anemia, unspecified: Secondary | ICD-10-CM | POA: Diagnosis present

## 2018-07-28 DIAGNOSIS — Z0389 Encounter for observation for other suspected diseases and conditions ruled out: Secondary | ICD-10-CM | POA: Diagnosis not present

## 2018-07-28 DIAGNOSIS — Z7902 Long term (current) use of antithrombotics/antiplatelets: Secondary | ICD-10-CM | POA: Diagnosis not present

## 2018-07-28 DIAGNOSIS — Z7984 Long term (current) use of oral hypoglycemic drugs: Secondary | ICD-10-CM | POA: Diagnosis not present

## 2018-07-28 DIAGNOSIS — F3342 Major depressive disorder, recurrent, in full remission: Secondary | ICD-10-CM | POA: Diagnosis present

## 2018-07-28 DIAGNOSIS — E869 Volume depletion, unspecified: Secondary | ICD-10-CM | POA: Diagnosis not present

## 2018-07-28 DIAGNOSIS — E785 Hyperlipidemia, unspecified: Secondary | ICD-10-CM | POA: Diagnosis present

## 2018-07-28 DIAGNOSIS — F419 Anxiety disorder, unspecified: Secondary | ICD-10-CM | POA: Diagnosis present

## 2018-07-28 DIAGNOSIS — K5900 Constipation, unspecified: Secondary | ICD-10-CM | POA: Diagnosis present

## 2018-07-28 DIAGNOSIS — J441 Chronic obstructive pulmonary disease with (acute) exacerbation: Secondary | ICD-10-CM | POA: Diagnosis present

## 2018-07-28 DIAGNOSIS — E872 Acidosis: Secondary | ICD-10-CM | POA: Diagnosis present

## 2018-07-28 DIAGNOSIS — Z7982 Long term (current) use of aspirin: Secondary | ICD-10-CM | POA: Diagnosis not present

## 2018-07-28 DIAGNOSIS — K4090 Unilateral inguinal hernia, without obstruction or gangrene, not specified as recurrent: Secondary | ICD-10-CM | POA: Diagnosis not present

## 2018-07-28 DIAGNOSIS — Z6841 Body Mass Index (BMI) 40.0 and over, adult: Secondary | ICD-10-CM | POA: Diagnosis not present

## 2018-07-28 DIAGNOSIS — E871 Hypo-osmolality and hyponatremia: Secondary | ICD-10-CM | POA: Diagnosis present

## 2018-07-28 DIAGNOSIS — Z7951 Long term (current) use of inhaled steroids: Secondary | ICD-10-CM | POA: Diagnosis not present

## 2018-07-28 DIAGNOSIS — E559 Vitamin D deficiency, unspecified: Secondary | ICD-10-CM | POA: Diagnosis present

## 2018-07-28 DIAGNOSIS — I110 Hypertensive heart disease with heart failure: Secondary | ICD-10-CM | POA: Diagnosis present

## 2018-07-28 DIAGNOSIS — J449 Chronic obstructive pulmonary disease, unspecified: Secondary | ICD-10-CM | POA: Diagnosis not present

## 2018-07-28 DIAGNOSIS — I5032 Chronic diastolic (congestive) heart failure: Secondary | ICD-10-CM | POA: Diagnosis present

## 2018-07-28 DIAGNOSIS — Z87891 Personal history of nicotine dependence: Secondary | ICD-10-CM | POA: Diagnosis not present

## 2018-07-28 DIAGNOSIS — E876 Hypokalemia: Secondary | ICD-10-CM | POA: Diagnosis not present

## 2018-07-28 DIAGNOSIS — N281 Cyst of kidney, acquired: Secondary | ICD-10-CM | POA: Diagnosis not present

## 2018-07-28 DIAGNOSIS — K760 Fatty (change of) liver, not elsewhere classified: Secondary | ICD-10-CM | POA: Diagnosis not present

## 2018-07-28 DIAGNOSIS — Z85118 Personal history of other malignant neoplasm of bronchus and lung: Secondary | ICD-10-CM | POA: Diagnosis not present

## 2018-07-28 DIAGNOSIS — E1142 Type 2 diabetes mellitus with diabetic polyneuropathy: Secondary | ICD-10-CM | POA: Diagnosis present

## 2018-07-28 DIAGNOSIS — E039 Hypothyroidism, unspecified: Secondary | ICD-10-CM | POA: Diagnosis present

## 2018-07-28 DIAGNOSIS — F05 Delirium due to known physiological condition: Secondary | ICD-10-CM | POA: Diagnosis present

## 2018-07-28 DIAGNOSIS — R05 Cough: Secondary | ICD-10-CM | POA: Diagnosis not present

## 2018-07-28 DIAGNOSIS — E860 Dehydration: Secondary | ICD-10-CM | POA: Diagnosis present

## 2018-07-30 DIAGNOSIS — I5032 Chronic diastolic (congestive) heart failure: Secondary | ICD-10-CM | POA: Diagnosis not present

## 2018-07-30 DIAGNOSIS — Z6841 Body Mass Index (BMI) 40.0 and over, adult: Secondary | ICD-10-CM | POA: Diagnosis not present

## 2018-07-30 DIAGNOSIS — E872 Acidosis: Secondary | ICD-10-CM | POA: Diagnosis not present

## 2018-07-30 DIAGNOSIS — I482 Chronic atrial fibrillation, unspecified: Secondary | ICD-10-CM | POA: Diagnosis not present

## 2018-07-30 DIAGNOSIS — E860 Dehydration: Secondary | ICD-10-CM | POA: Diagnosis not present

## 2018-07-31 DIAGNOSIS — E872 Acidosis: Secondary | ICD-10-CM | POA: Diagnosis not present

## 2018-07-31 DIAGNOSIS — R0609 Other forms of dyspnea: Secondary | ICD-10-CM | POA: Diagnosis not present

## 2018-07-31 DIAGNOSIS — I10 Essential (primary) hypertension: Secondary | ICD-10-CM | POA: Diagnosis not present

## 2018-08-01 DIAGNOSIS — E1142 Type 2 diabetes mellitus with diabetic polyneuropathy: Secondary | ICD-10-CM | POA: Diagnosis present

## 2018-08-01 DIAGNOSIS — Z7901 Long term (current) use of anticoagulants: Secondary | ICD-10-CM | POA: Diagnosis not present

## 2018-08-01 DIAGNOSIS — E860 Dehydration: Secondary | ICD-10-CM | POA: Diagnosis present

## 2018-08-01 DIAGNOSIS — F3342 Major depressive disorder, recurrent, in full remission: Secondary | ICD-10-CM | POA: Diagnosis present

## 2018-08-01 DIAGNOSIS — I2510 Atherosclerotic heart disease of native coronary artery without angina pectoris: Secondary | ICD-10-CM | POA: Diagnosis present

## 2018-08-01 DIAGNOSIS — I10 Essential (primary) hypertension: Secondary | ICD-10-CM | POA: Diagnosis not present

## 2018-08-01 DIAGNOSIS — I5032 Chronic diastolic (congestive) heart failure: Secondary | ICD-10-CM | POA: Diagnosis present

## 2018-08-01 DIAGNOSIS — I110 Hypertensive heart disease with heart failure: Secondary | ICD-10-CM | POA: Diagnosis present

## 2018-08-01 DIAGNOSIS — Z6841 Body Mass Index (BMI) 40.0 and over, adult: Secondary | ICD-10-CM | POA: Diagnosis not present

## 2018-08-01 DIAGNOSIS — R0609 Other forms of dyspnea: Secondary | ICD-10-CM | POA: Diagnosis not present

## 2018-08-01 DIAGNOSIS — E872 Acidosis: Secondary | ICD-10-CM | POA: Diagnosis present

## 2018-08-01 DIAGNOSIS — G4730 Sleep apnea, unspecified: Secondary | ICD-10-CM | POA: Diagnosis present

## 2018-08-01 DIAGNOSIS — Z87891 Personal history of nicotine dependence: Secondary | ICD-10-CM | POA: Diagnosis not present

## 2018-08-01 DIAGNOSIS — Z7984 Long term (current) use of oral hypoglycemic drugs: Secondary | ICD-10-CM | POA: Diagnosis not present

## 2018-08-01 DIAGNOSIS — E039 Hypothyroidism, unspecified: Secondary | ICD-10-CM | POA: Diagnosis present

## 2018-08-01 DIAGNOSIS — I482 Chronic atrial fibrillation, unspecified: Secondary | ICD-10-CM | POA: Diagnosis present

## 2018-08-04 DIAGNOSIS — E08621 Diabetes mellitus due to underlying condition with foot ulcer: Secondary | ICD-10-CM | POA: Diagnosis not present

## 2018-08-04 DIAGNOSIS — N401 Enlarged prostate with lower urinary tract symptoms: Secondary | ICD-10-CM | POA: Diagnosis not present

## 2018-08-04 DIAGNOSIS — R350 Frequency of micturition: Secondary | ICD-10-CM | POA: Diagnosis not present

## 2018-08-04 DIAGNOSIS — R6510 Systemic inflammatory response syndrome (SIRS) of non-infectious origin without acute organ dysfunction: Secondary | ICD-10-CM | POA: Diagnosis not present

## 2018-08-04 DIAGNOSIS — L97529 Non-pressure chronic ulcer of other part of left foot with unspecified severity: Secondary | ICD-10-CM | POA: Diagnosis not present

## 2018-08-17 DIAGNOSIS — L97511 Non-pressure chronic ulcer of other part of right foot limited to breakdown of skin: Secondary | ICD-10-CM | POA: Diagnosis not present

## 2018-08-17 DIAGNOSIS — R05 Cough: Secondary | ICD-10-CM | POA: Diagnosis not present

## 2018-08-17 DIAGNOSIS — E872 Acidosis: Secondary | ICD-10-CM | POA: Diagnosis not present

## 2018-08-17 DIAGNOSIS — A408 Other streptococcal sepsis: Secondary | ICD-10-CM | POA: Diagnosis not present

## 2018-08-17 DIAGNOSIS — I5032 Chronic diastolic (congestive) heart failure: Secondary | ICD-10-CM | POA: Diagnosis not present

## 2018-08-17 DIAGNOSIS — E11621 Type 2 diabetes mellitus with foot ulcer: Secondary | ICD-10-CM | POA: Diagnosis not present

## 2018-08-17 DIAGNOSIS — M19071 Primary osteoarthritis, right ankle and foot: Secondary | ICD-10-CM | POA: Diagnosis not present

## 2018-08-17 DIAGNOSIS — Z2239 Carrier of other specified bacterial diseases: Secondary | ICD-10-CM | POA: Diagnosis not present

## 2018-08-17 DIAGNOSIS — R0609 Other forms of dyspnea: Secondary | ICD-10-CM | POA: Diagnosis not present

## 2018-08-17 DIAGNOSIS — L03115 Cellulitis of right lower limb: Secondary | ICD-10-CM | POA: Diagnosis not present

## 2018-08-17 DIAGNOSIS — A498 Other bacterial infections of unspecified site: Secondary | ICD-10-CM | POA: Diagnosis not present

## 2018-08-17 DIAGNOSIS — A419 Sepsis, unspecified organism: Secondary | ICD-10-CM | POA: Diagnosis not present

## 2018-08-17 DIAGNOSIS — Z6841 Body Mass Index (BMI) 40.0 and over, adult: Secondary | ICD-10-CM | POA: Diagnosis not present

## 2018-08-18 DIAGNOSIS — L97512 Non-pressure chronic ulcer of other part of right foot with fat layer exposed: Secondary | ICD-10-CM | POA: Diagnosis not present

## 2018-08-18 DIAGNOSIS — E11621 Type 2 diabetes mellitus with foot ulcer: Secondary | ICD-10-CM | POA: Diagnosis not present

## 2018-08-20 DIAGNOSIS — R600 Localized edema: Secondary | ICD-10-CM | POA: Diagnosis not present

## 2018-08-20 DIAGNOSIS — L089 Local infection of the skin and subcutaneous tissue, unspecified: Secondary | ICD-10-CM | POA: Diagnosis not present

## 2018-08-20 DIAGNOSIS — M19071 Primary osteoarthritis, right ankle and foot: Secondary | ICD-10-CM | POA: Diagnosis not present

## 2018-08-20 DIAGNOSIS — E1142 Type 2 diabetes mellitus with diabetic polyneuropathy: Secondary | ICD-10-CM | POA: Diagnosis present

## 2018-08-20 DIAGNOSIS — Z7902 Long term (current) use of antithrombotics/antiplatelets: Secondary | ICD-10-CM | POA: Diagnosis not present

## 2018-08-20 DIAGNOSIS — I110 Hypertensive heart disease with heart failure: Secondary | ICD-10-CM | POA: Diagnosis present

## 2018-08-20 DIAGNOSIS — L97512 Non-pressure chronic ulcer of other part of right foot with fat layer exposed: Secondary | ICD-10-CM | POA: Diagnosis present

## 2018-08-20 DIAGNOSIS — E039 Hypothyroidism, unspecified: Secondary | ICD-10-CM | POA: Diagnosis present

## 2018-08-20 DIAGNOSIS — L97511 Non-pressure chronic ulcer of other part of right foot limited to breakdown of skin: Secondary | ICD-10-CM | POA: Diagnosis not present

## 2018-08-20 DIAGNOSIS — A408 Other streptococcal sepsis: Secondary | ICD-10-CM | POA: Diagnosis not present

## 2018-08-20 DIAGNOSIS — J449 Chronic obstructive pulmonary disease, unspecified: Secondary | ICD-10-CM | POA: Diagnosis present

## 2018-08-20 DIAGNOSIS — Z7901 Long term (current) use of anticoagulants: Secondary | ICD-10-CM | POA: Diagnosis not present

## 2018-08-20 DIAGNOSIS — E11628 Type 2 diabetes mellitus with other skin complications: Secondary | ICD-10-CM | POA: Diagnosis not present

## 2018-08-20 DIAGNOSIS — L97519 Non-pressure chronic ulcer of other part of right foot with unspecified severity: Secondary | ICD-10-CM | POA: Diagnosis not present

## 2018-08-20 DIAGNOSIS — A419 Sepsis, unspecified organism: Secondary | ICD-10-CM | POA: Diagnosis present

## 2018-08-20 DIAGNOSIS — E11621 Type 2 diabetes mellitus with foot ulcer: Secondary | ICD-10-CM | POA: Diagnosis not present

## 2018-08-20 DIAGNOSIS — E1140 Type 2 diabetes mellitus with diabetic neuropathy, unspecified: Secondary | ICD-10-CM | POA: Diagnosis not present

## 2018-08-20 DIAGNOSIS — E785 Hyperlipidemia, unspecified: Secondary | ICD-10-CM | POA: Diagnosis present

## 2018-08-20 DIAGNOSIS — F329 Major depressive disorder, single episode, unspecified: Secondary | ICD-10-CM | POA: Diagnosis present

## 2018-08-20 DIAGNOSIS — I4891 Unspecified atrial fibrillation: Secondary | ICD-10-CM | POA: Diagnosis present

## 2018-08-20 DIAGNOSIS — M869 Osteomyelitis, unspecified: Secondary | ICD-10-CM | POA: Diagnosis not present

## 2018-08-20 DIAGNOSIS — I5032 Chronic diastolic (congestive) heart failure: Secondary | ICD-10-CM | POA: Diagnosis present

## 2018-08-20 DIAGNOSIS — E872 Acidosis: Secondary | ICD-10-CM | POA: Diagnosis present

## 2018-08-20 DIAGNOSIS — R601 Generalized edema: Secondary | ICD-10-CM | POA: Diagnosis not present

## 2018-08-20 DIAGNOSIS — L03115 Cellulitis of right lower limb: Secondary | ICD-10-CM | POA: Diagnosis present

## 2018-08-20 DIAGNOSIS — Z7982 Long term (current) use of aspirin: Secondary | ICD-10-CM | POA: Diagnosis not present

## 2018-08-20 DIAGNOSIS — Z6841 Body Mass Index (BMI) 40.0 and over, adult: Secondary | ICD-10-CM | POA: Diagnosis not present

## 2018-08-20 DIAGNOSIS — Z7984 Long term (current) use of oral hypoglycemic drugs: Secondary | ICD-10-CM | POA: Diagnosis not present

## 2018-08-25 DIAGNOSIS — L97512 Non-pressure chronic ulcer of other part of right foot with fat layer exposed: Secondary | ICD-10-CM | POA: Diagnosis not present

## 2018-08-25 DIAGNOSIS — E11621 Type 2 diabetes mellitus with foot ulcer: Secondary | ICD-10-CM | POA: Diagnosis not present

## 2018-09-01 DIAGNOSIS — I482 Chronic atrial fibrillation, unspecified: Secondary | ICD-10-CM | POA: Diagnosis not present

## 2018-09-01 DIAGNOSIS — E11621 Type 2 diabetes mellitus with foot ulcer: Secondary | ICD-10-CM | POA: Diagnosis not present

## 2018-09-01 DIAGNOSIS — E039 Hypothyroidism, unspecified: Secondary | ICD-10-CM | POA: Diagnosis not present

## 2018-09-01 DIAGNOSIS — L97512 Non-pressure chronic ulcer of other part of right foot with fat layer exposed: Secondary | ICD-10-CM | POA: Diagnosis not present

## 2018-09-03 DIAGNOSIS — Z2239 Carrier of other specified bacterial diseases: Secondary | ICD-10-CM | POA: Diagnosis not present

## 2018-09-03 DIAGNOSIS — E1142 Type 2 diabetes mellitus with diabetic polyneuropathy: Secondary | ICD-10-CM | POA: Diagnosis not present

## 2018-09-03 DIAGNOSIS — I10 Essential (primary) hypertension: Secondary | ICD-10-CM | POA: Diagnosis not present

## 2018-09-07 ENCOUNTER — Telehealth: Payer: Self-pay | Admitting: Emergency Medicine

## 2018-09-07 ENCOUNTER — Ambulatory Visit: Payer: Medicare Other | Admitting: Cardiology

## 2018-09-07 NOTE — Telephone Encounter (Signed)
Called to ask patient about rescheduling appointment. They had already rescheduled due to patient recently having pneumonia. I advised him to call us with any concerns.

## 2018-09-08 DIAGNOSIS — E11621 Type 2 diabetes mellitus with foot ulcer: Secondary | ICD-10-CM | POA: Diagnosis not present

## 2018-09-08 DIAGNOSIS — I482 Chronic atrial fibrillation, unspecified: Secondary | ICD-10-CM | POA: Diagnosis not present

## 2018-09-08 DIAGNOSIS — L97512 Non-pressure chronic ulcer of other part of right foot with fat layer exposed: Secondary | ICD-10-CM | POA: Diagnosis not present

## 2018-09-10 DIAGNOSIS — B951 Streptococcus, group B, as the cause of diseases classified elsewhere: Secondary | ICD-10-CM | POA: Diagnosis not present

## 2018-09-10 DIAGNOSIS — Z4800 Encounter for change or removal of nonsurgical wound dressing: Secondary | ICD-10-CM | POA: Diagnosis not present

## 2018-09-10 DIAGNOSIS — E11621 Type 2 diabetes mellitus with foot ulcer: Secondary | ICD-10-CM | POA: Diagnosis not present

## 2018-09-10 DIAGNOSIS — Z7901 Long term (current) use of anticoagulants: Secondary | ICD-10-CM | POA: Diagnosis not present

## 2018-09-10 DIAGNOSIS — J449 Chronic obstructive pulmonary disease, unspecified: Secondary | ICD-10-CM | POA: Diagnosis not present

## 2018-09-10 DIAGNOSIS — Z7902 Long term (current) use of antithrombotics/antiplatelets: Secondary | ICD-10-CM | POA: Diagnosis not present

## 2018-09-10 DIAGNOSIS — L97512 Non-pressure chronic ulcer of other part of right foot with fat layer exposed: Secondary | ICD-10-CM | POA: Diagnosis not present

## 2018-09-10 DIAGNOSIS — I4891 Unspecified atrial fibrillation: Secondary | ICD-10-CM | POA: Diagnosis not present

## 2018-09-10 DIAGNOSIS — E039 Hypothyroidism, unspecified: Secondary | ICD-10-CM | POA: Diagnosis not present

## 2018-09-10 DIAGNOSIS — I5032 Chronic diastolic (congestive) heart failure: Secondary | ICD-10-CM | POA: Diagnosis not present

## 2018-09-10 DIAGNOSIS — Z6841 Body Mass Index (BMI) 40.0 and over, adult: Secondary | ICD-10-CM | POA: Diagnosis not present

## 2018-09-10 DIAGNOSIS — I110 Hypertensive heart disease with heart failure: Secondary | ICD-10-CM | POA: Diagnosis not present

## 2018-09-10 DIAGNOSIS — Z7984 Long term (current) use of oral hypoglycemic drugs: Secondary | ICD-10-CM | POA: Diagnosis not present

## 2018-09-10 DIAGNOSIS — E1142 Type 2 diabetes mellitus with diabetic polyneuropathy: Secondary | ICD-10-CM | POA: Diagnosis not present

## 2018-09-10 DIAGNOSIS — F3342 Major depressive disorder, recurrent, in full remission: Secondary | ICD-10-CM | POA: Diagnosis not present

## 2018-09-10 DIAGNOSIS — Z9181 History of falling: Secondary | ICD-10-CM | POA: Diagnosis not present

## 2018-09-12 DIAGNOSIS — I5032 Chronic diastolic (congestive) heart failure: Secondary | ICD-10-CM | POA: Diagnosis not present

## 2018-09-12 DIAGNOSIS — B951 Streptococcus, group B, as the cause of diseases classified elsewhere: Secondary | ICD-10-CM | POA: Diagnosis not present

## 2018-09-12 DIAGNOSIS — I110 Hypertensive heart disease with heart failure: Secondary | ICD-10-CM | POA: Diagnosis not present

## 2018-09-12 DIAGNOSIS — L97512 Non-pressure chronic ulcer of other part of right foot with fat layer exposed: Secondary | ICD-10-CM | POA: Diagnosis not present

## 2018-09-12 DIAGNOSIS — E1142 Type 2 diabetes mellitus with diabetic polyneuropathy: Secondary | ICD-10-CM | POA: Diagnosis not present

## 2018-09-12 DIAGNOSIS — E11621 Type 2 diabetes mellitus with foot ulcer: Secondary | ICD-10-CM | POA: Diagnosis not present

## 2018-09-15 DIAGNOSIS — I482 Chronic atrial fibrillation, unspecified: Secondary | ICD-10-CM | POA: Diagnosis not present

## 2018-09-15 DIAGNOSIS — L97512 Non-pressure chronic ulcer of other part of right foot with fat layer exposed: Secondary | ICD-10-CM | POA: Diagnosis not present

## 2018-09-15 DIAGNOSIS — I5032 Chronic diastolic (congestive) heart failure: Secondary | ICD-10-CM | POA: Diagnosis not present

## 2018-09-15 DIAGNOSIS — E11621 Type 2 diabetes mellitus with foot ulcer: Secondary | ICD-10-CM | POA: Diagnosis not present

## 2018-09-15 DIAGNOSIS — Z6841 Body Mass Index (BMI) 40.0 and over, adult: Secondary | ICD-10-CM | POA: Diagnosis not present

## 2018-09-17 DIAGNOSIS — B951 Streptococcus, group B, as the cause of diseases classified elsewhere: Secondary | ICD-10-CM | POA: Diagnosis not present

## 2018-09-17 DIAGNOSIS — L97512 Non-pressure chronic ulcer of other part of right foot with fat layer exposed: Secondary | ICD-10-CM | POA: Diagnosis not present

## 2018-09-17 DIAGNOSIS — E1142 Type 2 diabetes mellitus with diabetic polyneuropathy: Secondary | ICD-10-CM | POA: Diagnosis not present

## 2018-09-17 DIAGNOSIS — I5032 Chronic diastolic (congestive) heart failure: Secondary | ICD-10-CM | POA: Diagnosis not present

## 2018-09-17 DIAGNOSIS — E11621 Type 2 diabetes mellitus with foot ulcer: Secondary | ICD-10-CM | POA: Diagnosis not present

## 2018-09-17 DIAGNOSIS — I110 Hypertensive heart disease with heart failure: Secondary | ICD-10-CM | POA: Diagnosis not present

## 2018-09-20 DIAGNOSIS — I5032 Chronic diastolic (congestive) heart failure: Secondary | ICD-10-CM | POA: Diagnosis not present

## 2018-09-20 DIAGNOSIS — I110 Hypertensive heart disease with heart failure: Secondary | ICD-10-CM | POA: Diagnosis not present

## 2018-09-20 DIAGNOSIS — L97512 Non-pressure chronic ulcer of other part of right foot with fat layer exposed: Secondary | ICD-10-CM | POA: Diagnosis not present

## 2018-09-20 DIAGNOSIS — E11621 Type 2 diabetes mellitus with foot ulcer: Secondary | ICD-10-CM | POA: Diagnosis not present

## 2018-09-20 DIAGNOSIS — B951 Streptococcus, group B, as the cause of diseases classified elsewhere: Secondary | ICD-10-CM | POA: Diagnosis not present

## 2018-09-20 DIAGNOSIS — E1142 Type 2 diabetes mellitus with diabetic polyneuropathy: Secondary | ICD-10-CM | POA: Diagnosis not present

## 2018-09-22 DIAGNOSIS — I482 Chronic atrial fibrillation, unspecified: Secondary | ICD-10-CM | POA: Diagnosis not present

## 2018-09-22 DIAGNOSIS — E11621 Type 2 diabetes mellitus with foot ulcer: Secondary | ICD-10-CM | POA: Diagnosis not present

## 2018-09-22 DIAGNOSIS — Z6841 Body Mass Index (BMI) 40.0 and over, adult: Secondary | ICD-10-CM | POA: Diagnosis not present

## 2018-09-22 DIAGNOSIS — L97512 Non-pressure chronic ulcer of other part of right foot with fat layer exposed: Secondary | ICD-10-CM | POA: Diagnosis not present

## 2018-09-24 DIAGNOSIS — L97512 Non-pressure chronic ulcer of other part of right foot with fat layer exposed: Secondary | ICD-10-CM | POA: Diagnosis not present

## 2018-09-24 DIAGNOSIS — I110 Hypertensive heart disease with heart failure: Secondary | ICD-10-CM | POA: Diagnosis not present

## 2018-09-24 DIAGNOSIS — I5032 Chronic diastolic (congestive) heart failure: Secondary | ICD-10-CM | POA: Diagnosis not present

## 2018-09-24 DIAGNOSIS — E1142 Type 2 diabetes mellitus with diabetic polyneuropathy: Secondary | ICD-10-CM | POA: Diagnosis not present

## 2018-09-24 DIAGNOSIS — B951 Streptococcus, group B, as the cause of diseases classified elsewhere: Secondary | ICD-10-CM | POA: Diagnosis not present

## 2018-09-24 DIAGNOSIS — E11621 Type 2 diabetes mellitus with foot ulcer: Secondary | ICD-10-CM | POA: Diagnosis not present

## 2018-09-27 DIAGNOSIS — E11621 Type 2 diabetes mellitus with foot ulcer: Secondary | ICD-10-CM | POA: Diagnosis not present

## 2018-09-27 DIAGNOSIS — L97512 Non-pressure chronic ulcer of other part of right foot with fat layer exposed: Secondary | ICD-10-CM | POA: Diagnosis not present

## 2018-09-28 ENCOUNTER — Telehealth: Payer: Self-pay | Admitting: Cardiology

## 2018-09-28 NOTE — Telephone Encounter (Signed)
Virtual Visit Pre-Appointment Phone Call  Steps For Call:  1. Confirm consent - "In the setting of the current Covid19 crisis, you are scheduled for a (phone or video) visit with your provider on (date) at (time).  Just as we do with many in-office visits, in order for you to participate in this visit, we must obtain consent.  If you'd like, I can send this to your mychart (if signed up) or email for you to review.  Otherwise, I can obtain your verbal consent now.  All virtual visits are billed to your insurance company just like a normal visit would be.  By agreeing to a virtual visit, we'd like you to understand that the technology does not allow for your provider to perform an examination, and thus may limit your provider's ability to fully assess your condition.  Finally, though the technology is pretty good, we cannot assure that it will always work on either your or our end, and in the setting of a video visit, we may have to convert it to a phone-only visit.  In either situation, we cannot ensure that we have a secure connection.  Are you willing to proceed?"  2. Give patient instructions for WebEx download to smartphone as below if video visit  3. Advise patient to be prepared with any vital sign or heart rhythm information, their current medicines, and a piece of paper and pen handy for any instructions they may receive the day of their visit  4. Inform patient they will receive a phone call 15 minutes prior to their appointment time (may be from unknown caller ID) so they should be prepared to answer  5. Confirm that appointment type is correct in Epic appointment notes (video vs telephone)    TELEPHONE CALL NOTE  Travis Watts has been deemed a candidate for a follow-up tele-health visit to limit community exposure during the Covid-19 pandemic. I spoke with the patient via phone to ensure availability of phone/video source, confirm preferred email & phone number, and discuss  instructions and expectations.  I reminded Travis Watts to be prepared with any vital sign and/or heart rhythm information that could potentially be obtained via home monitoring, at the time of his visit. I reminded Travis Watts to expect a phone call at the time of his visit if his visit.  Did the patient verbally acknowledge consent to treatment? YES/verbal consent  Travis Watts 09/28/2018 1:43 PM   DOWNLOADING THE [place], go to [employer] and type in WebEx in the search bar. [place] [employer], the blue/green circle. The app is free but as with any other app downloads, their phone may require them to verify saved payment information or Apple password. The patient does NOT have to create an account.  - If Android, ask patient to go to [employer] and type in WebEx in the search bar. [place] [employer], the blue/green circle. The app is free but as with any other app downloads, their phone may require them to verify saved payment information or Android password. The patient does NOT have to create an account.   CONSENT FOR TELE-HEALTH VISIT - PLEASE REVIEW  I hereby voluntarily request, consent and authorize [place] and its employed or contracted physicians, physician assistants, nurse practitioners or other licensed health care professionals (the Practitioner), to provide me with telemedicine health care services (the Services") as deemed necessary by the treating Practitioner.  I acknowledge and consent to receive the Services by the Practitioner via telemedicine. I understand that the telemedicine visit will involve communicating with the Practitioner through live audiovisual communication technology and the disclosure of certain medical information by electronic transmission. I acknowledge that I have been given the opportunity to request an in-person assessment or other available alternative prior to the  telemedicine visit and am voluntarily participating in the telemedicine visit.  I understand that I have the right to withhold or withdraw my consent to the use of telemedicine in the course of my care at any time, without affecting my right to future care or treatment, and that the Practitioner or I may terminate the telemedicine visit at any time. I understand that I have the right to inspect all information obtained and/or recorded in the course of the telemedicine visit and may receive copies of available information for a reasonable fee.  I understand that some of the potential risks of receiving the Services via telemedicine include:   Delay or interruption in medical evaluation due to technological equipment failure or disruption;  Information transmitted may not be sufficient (e.g. poor resolution of images) to allow for appropriate medical decision making by the Practitioner; and/or   In rare instances, security protocols could fail, causing a breach of personal health information.  Furthermore, I acknowledge that it is my responsibility to provide information about my medical history, conditions and care that is complete and accurate to the best of my ability. I acknowledge that Practitioner's advice, recommendations, and/or decision may be based on factors not within their control, such as incomplete or inaccurate data provided by me or distortions of diagnostic images or specimens that may result from electronic transmissions. I understand that the practice of medicine is not an exact science and that Practitioner makes no warranties or guarantees regarding treatment outcomes. I acknowledge that I will receive a copy of this consent concurrently upon execution via email to the email address I last provided but may also request a printed copy by calling the office of [place].    I understand that my insurance will be billed for this visit.   I have read or had this consent read to  me.  I understand the contents of this consent, which adequately explains the benefits and risks of the Services being provided via telemedicine.   I have been provided ample opportunity to ask questions regarding this consent and the Services and have had my questions answered to my satisfaction.  I give my informed consent for the services to be provided through the use of telemedicine in my medical care  By participating in this telemedicine visit I agree to the above.

## 2018-09-30 DIAGNOSIS — E11621 Type 2 diabetes mellitus with foot ulcer: Secondary | ICD-10-CM | POA: Diagnosis not present

## 2018-09-30 DIAGNOSIS — I110 Hypertensive heart disease with heart failure: Secondary | ICD-10-CM | POA: Diagnosis not present

## 2018-09-30 DIAGNOSIS — I5032 Chronic diastolic (congestive) heart failure: Secondary | ICD-10-CM | POA: Diagnosis not present

## 2018-09-30 DIAGNOSIS — E1142 Type 2 diabetes mellitus with diabetic polyneuropathy: Secondary | ICD-10-CM | POA: Diagnosis not present

## 2018-09-30 DIAGNOSIS — L97512 Non-pressure chronic ulcer of other part of right foot with fat layer exposed: Secondary | ICD-10-CM | POA: Diagnosis not present

## 2018-09-30 DIAGNOSIS — B951 Streptococcus, group B, as the cause of diseases classified elsewhere: Secondary | ICD-10-CM | POA: Diagnosis not present

## 2018-10-04 DIAGNOSIS — E1142 Type 2 diabetes mellitus with diabetic polyneuropathy: Secondary | ICD-10-CM | POA: Diagnosis not present

## 2018-10-04 DIAGNOSIS — L97512 Non-pressure chronic ulcer of other part of right foot with fat layer exposed: Secondary | ICD-10-CM | POA: Diagnosis not present

## 2018-10-04 DIAGNOSIS — E11621 Type 2 diabetes mellitus with foot ulcer: Secondary | ICD-10-CM | POA: Diagnosis not present

## 2018-10-04 DIAGNOSIS — I110 Hypertensive heart disease with heart failure: Secondary | ICD-10-CM | POA: Diagnosis not present

## 2018-10-04 DIAGNOSIS — B951 Streptococcus, group B, as the cause of diseases classified elsewhere: Secondary | ICD-10-CM | POA: Diagnosis not present

## 2018-10-04 DIAGNOSIS — I5032 Chronic diastolic (congestive) heart failure: Secondary | ICD-10-CM | POA: Diagnosis not present

## 2018-10-05 ENCOUNTER — Telehealth: Payer: Medicare Other | Admitting: Cardiology

## 2018-10-05 ENCOUNTER — Other Ambulatory Visit: Payer: Self-pay

## 2018-10-06 DIAGNOSIS — E1142 Type 2 diabetes mellitus with diabetic polyneuropathy: Secondary | ICD-10-CM | POA: Diagnosis not present

## 2018-10-06 DIAGNOSIS — I110 Hypertensive heart disease with heart failure: Secondary | ICD-10-CM | POA: Diagnosis not present

## 2018-10-06 DIAGNOSIS — B951 Streptococcus, group B, as the cause of diseases classified elsewhere: Secondary | ICD-10-CM | POA: Diagnosis not present

## 2018-10-06 DIAGNOSIS — E11621 Type 2 diabetes mellitus with foot ulcer: Secondary | ICD-10-CM | POA: Diagnosis not present

## 2018-10-06 DIAGNOSIS — L97512 Non-pressure chronic ulcer of other part of right foot with fat layer exposed: Secondary | ICD-10-CM | POA: Diagnosis not present

## 2018-10-06 DIAGNOSIS — I5032 Chronic diastolic (congestive) heart failure: Secondary | ICD-10-CM | POA: Diagnosis not present

## 2018-10-08 DIAGNOSIS — L97512 Non-pressure chronic ulcer of other part of right foot with fat layer exposed: Secondary | ICD-10-CM | POA: Diagnosis not present

## 2018-10-08 DIAGNOSIS — E11621 Type 2 diabetes mellitus with foot ulcer: Secondary | ICD-10-CM | POA: Diagnosis not present

## 2018-10-08 DIAGNOSIS — B951 Streptococcus, group B, as the cause of diseases classified elsewhere: Secondary | ICD-10-CM | POA: Diagnosis not present

## 2018-10-08 DIAGNOSIS — I110 Hypertensive heart disease with heart failure: Secondary | ICD-10-CM | POA: Diagnosis not present

## 2018-10-08 DIAGNOSIS — I5032 Chronic diastolic (congestive) heart failure: Secondary | ICD-10-CM | POA: Diagnosis not present

## 2018-10-08 DIAGNOSIS — E1142 Type 2 diabetes mellitus with diabetic polyneuropathy: Secondary | ICD-10-CM | POA: Diagnosis not present

## 2018-10-10 DIAGNOSIS — E039 Hypothyroidism, unspecified: Secondary | ICD-10-CM | POA: Diagnosis not present

## 2018-10-10 DIAGNOSIS — I110 Hypertensive heart disease with heart failure: Secondary | ICD-10-CM | POA: Diagnosis not present

## 2018-10-10 DIAGNOSIS — Z7901 Long term (current) use of anticoagulants: Secondary | ICD-10-CM | POA: Diagnosis not present

## 2018-10-10 DIAGNOSIS — B951 Streptococcus, group B, as the cause of diseases classified elsewhere: Secondary | ICD-10-CM | POA: Diagnosis not present

## 2018-10-10 DIAGNOSIS — Z7984 Long term (current) use of oral hypoglycemic drugs: Secondary | ICD-10-CM | POA: Diagnosis not present

## 2018-10-10 DIAGNOSIS — I5032 Chronic diastolic (congestive) heart failure: Secondary | ICD-10-CM | POA: Diagnosis not present

## 2018-10-10 DIAGNOSIS — Z6841 Body Mass Index (BMI) 40.0 and over, adult: Secondary | ICD-10-CM | POA: Diagnosis not present

## 2018-10-10 DIAGNOSIS — F3342 Major depressive disorder, recurrent, in full remission: Secondary | ICD-10-CM | POA: Diagnosis not present

## 2018-10-10 DIAGNOSIS — Z7902 Long term (current) use of antithrombotics/antiplatelets: Secondary | ICD-10-CM | POA: Diagnosis not present

## 2018-10-10 DIAGNOSIS — Z9181 History of falling: Secondary | ICD-10-CM | POA: Diagnosis not present

## 2018-10-10 DIAGNOSIS — Z4800 Encounter for change or removal of nonsurgical wound dressing: Secondary | ICD-10-CM | POA: Diagnosis not present

## 2018-10-10 DIAGNOSIS — L97512 Non-pressure chronic ulcer of other part of right foot with fat layer exposed: Secondary | ICD-10-CM | POA: Diagnosis not present

## 2018-10-10 DIAGNOSIS — I4891 Unspecified atrial fibrillation: Secondary | ICD-10-CM | POA: Diagnosis not present

## 2018-10-10 DIAGNOSIS — J449 Chronic obstructive pulmonary disease, unspecified: Secondary | ICD-10-CM | POA: Diagnosis not present

## 2018-10-10 DIAGNOSIS — E11621 Type 2 diabetes mellitus with foot ulcer: Secondary | ICD-10-CM | POA: Diagnosis not present

## 2018-10-10 DIAGNOSIS — E1142 Type 2 diabetes mellitus with diabetic polyneuropathy: Secondary | ICD-10-CM | POA: Diagnosis not present

## 2018-10-11 DIAGNOSIS — E11621 Type 2 diabetes mellitus with foot ulcer: Secondary | ICD-10-CM | POA: Diagnosis not present

## 2018-10-11 DIAGNOSIS — L97512 Non-pressure chronic ulcer of other part of right foot with fat layer exposed: Secondary | ICD-10-CM | POA: Diagnosis not present

## 2018-10-11 DIAGNOSIS — I482 Chronic atrial fibrillation, unspecified: Secondary | ICD-10-CM | POA: Diagnosis not present

## 2018-10-15 DIAGNOSIS — E1142 Type 2 diabetes mellitus with diabetic polyneuropathy: Secondary | ICD-10-CM | POA: Diagnosis not present

## 2018-10-15 DIAGNOSIS — I5032 Chronic diastolic (congestive) heart failure: Secondary | ICD-10-CM | POA: Diagnosis not present

## 2018-10-15 DIAGNOSIS — E11621 Type 2 diabetes mellitus with foot ulcer: Secondary | ICD-10-CM | POA: Diagnosis not present

## 2018-10-15 DIAGNOSIS — B951 Streptococcus, group B, as the cause of diseases classified elsewhere: Secondary | ICD-10-CM | POA: Diagnosis not present

## 2018-10-15 DIAGNOSIS — L97512 Non-pressure chronic ulcer of other part of right foot with fat layer exposed: Secondary | ICD-10-CM | POA: Diagnosis not present

## 2018-10-15 DIAGNOSIS — I110 Hypertensive heart disease with heart failure: Secondary | ICD-10-CM | POA: Diagnosis not present

## 2018-10-18 DIAGNOSIS — E11621 Type 2 diabetes mellitus with foot ulcer: Secondary | ICD-10-CM | POA: Diagnosis not present

## 2018-10-18 DIAGNOSIS — L97512 Non-pressure chronic ulcer of other part of right foot with fat layer exposed: Secondary | ICD-10-CM | POA: Diagnosis not present

## 2018-10-22 DIAGNOSIS — E11621 Type 2 diabetes mellitus with foot ulcer: Secondary | ICD-10-CM | POA: Diagnosis not present

## 2018-10-22 DIAGNOSIS — B951 Streptococcus, group B, as the cause of diseases classified elsewhere: Secondary | ICD-10-CM | POA: Diagnosis not present

## 2018-10-22 DIAGNOSIS — I110 Hypertensive heart disease with heart failure: Secondary | ICD-10-CM | POA: Diagnosis not present

## 2018-10-22 DIAGNOSIS — L97512 Non-pressure chronic ulcer of other part of right foot with fat layer exposed: Secondary | ICD-10-CM | POA: Diagnosis not present

## 2018-10-22 DIAGNOSIS — E1142 Type 2 diabetes mellitus with diabetic polyneuropathy: Secondary | ICD-10-CM | POA: Diagnosis not present

## 2018-10-22 DIAGNOSIS — I5032 Chronic diastolic (congestive) heart failure: Secondary | ICD-10-CM | POA: Diagnosis not present

## 2018-10-29 DIAGNOSIS — E1142 Type 2 diabetes mellitus with diabetic polyneuropathy: Secondary | ICD-10-CM | POA: Diagnosis not present

## 2018-10-29 DIAGNOSIS — L97512 Non-pressure chronic ulcer of other part of right foot with fat layer exposed: Secondary | ICD-10-CM | POA: Diagnosis not present

## 2018-10-29 DIAGNOSIS — I110 Hypertensive heart disease with heart failure: Secondary | ICD-10-CM | POA: Diagnosis not present

## 2018-10-29 DIAGNOSIS — B951 Streptococcus, group B, as the cause of diseases classified elsewhere: Secondary | ICD-10-CM | POA: Diagnosis not present

## 2018-10-29 DIAGNOSIS — I5032 Chronic diastolic (congestive) heart failure: Secondary | ICD-10-CM | POA: Diagnosis not present

## 2018-10-29 DIAGNOSIS — E11621 Type 2 diabetes mellitus with foot ulcer: Secondary | ICD-10-CM | POA: Diagnosis not present

## 2018-11-01 DIAGNOSIS — L97812 Non-pressure chronic ulcer of other part of right lower leg with fat layer exposed: Secondary | ICD-10-CM | POA: Diagnosis not present

## 2018-11-01 DIAGNOSIS — E11621 Type 2 diabetes mellitus with foot ulcer: Secondary | ICD-10-CM | POA: Diagnosis not present

## 2018-11-01 DIAGNOSIS — L97512 Non-pressure chronic ulcer of other part of right foot with fat layer exposed: Secondary | ICD-10-CM | POA: Diagnosis not present

## 2018-11-01 DIAGNOSIS — S90821D Blister (nonthermal), right foot, subsequent encounter: Secondary | ICD-10-CM | POA: Diagnosis not present

## 2018-11-03 DIAGNOSIS — L97512 Non-pressure chronic ulcer of other part of right foot with fat layer exposed: Secondary | ICD-10-CM | POA: Diagnosis not present

## 2018-11-03 DIAGNOSIS — E11621 Type 2 diabetes mellitus with foot ulcer: Secondary | ICD-10-CM | POA: Diagnosis not present

## 2018-11-03 DIAGNOSIS — E1140 Type 2 diabetes mellitus with diabetic neuropathy, unspecified: Secondary | ICD-10-CM | POA: Diagnosis not present

## 2018-11-05 DIAGNOSIS — I110 Hypertensive heart disease with heart failure: Secondary | ICD-10-CM | POA: Diagnosis not present

## 2018-11-05 DIAGNOSIS — B951 Streptococcus, group B, as the cause of diseases classified elsewhere: Secondary | ICD-10-CM | POA: Diagnosis not present

## 2018-11-05 DIAGNOSIS — E11621 Type 2 diabetes mellitus with foot ulcer: Secondary | ICD-10-CM | POA: Diagnosis not present

## 2018-11-05 DIAGNOSIS — L97512 Non-pressure chronic ulcer of other part of right foot with fat layer exposed: Secondary | ICD-10-CM | POA: Diagnosis not present

## 2018-11-05 DIAGNOSIS — E1142 Type 2 diabetes mellitus with diabetic polyneuropathy: Secondary | ICD-10-CM | POA: Diagnosis not present

## 2018-11-05 DIAGNOSIS — I5032 Chronic diastolic (congestive) heart failure: Secondary | ICD-10-CM | POA: Diagnosis not present

## 2018-11-08 DIAGNOSIS — R5383 Other fatigue: Secondary | ICD-10-CM | POA: Diagnosis not present

## 2018-11-08 DIAGNOSIS — R58 Hemorrhage, not elsewhere classified: Secondary | ICD-10-CM | POA: Diagnosis not present

## 2018-11-08 DIAGNOSIS — L97512 Non-pressure chronic ulcer of other part of right foot with fat layer exposed: Secondary | ICD-10-CM | POA: Diagnosis not present

## 2018-11-08 DIAGNOSIS — I5032 Chronic diastolic (congestive) heart failure: Secondary | ICD-10-CM | POA: Diagnosis not present

## 2018-11-08 DIAGNOSIS — L97522 Non-pressure chronic ulcer of other part of left foot with fat layer exposed: Secondary | ICD-10-CM | POA: Diagnosis not present

## 2018-11-08 DIAGNOSIS — I10 Essential (primary) hypertension: Secondary | ICD-10-CM | POA: Diagnosis not present

## 2018-11-08 DIAGNOSIS — R0609 Other forms of dyspnea: Secondary | ICD-10-CM | POA: Diagnosis not present

## 2018-11-08 DIAGNOSIS — E11621 Type 2 diabetes mellitus with foot ulcer: Secondary | ICD-10-CM | POA: Diagnosis not present

## 2018-11-08 DIAGNOSIS — I482 Chronic atrial fibrillation, unspecified: Secondary | ICD-10-CM | POA: Diagnosis not present

## 2018-11-09 DIAGNOSIS — Z4800 Encounter for change or removal of nonsurgical wound dressing: Secondary | ICD-10-CM | POA: Diagnosis not present

## 2018-11-09 DIAGNOSIS — I5032 Chronic diastolic (congestive) heart failure: Secondary | ICD-10-CM | POA: Diagnosis not present

## 2018-11-09 DIAGNOSIS — E1142 Type 2 diabetes mellitus with diabetic polyneuropathy: Secondary | ICD-10-CM | POA: Diagnosis not present

## 2018-11-09 DIAGNOSIS — Z6841 Body Mass Index (BMI) 40.0 and over, adult: Secondary | ICD-10-CM | POA: Diagnosis not present

## 2018-11-09 DIAGNOSIS — Z7902 Long term (current) use of antithrombotics/antiplatelets: Secondary | ICD-10-CM | POA: Diagnosis not present

## 2018-11-09 DIAGNOSIS — Z7901 Long term (current) use of anticoagulants: Secondary | ICD-10-CM | POA: Diagnosis not present

## 2018-11-09 DIAGNOSIS — L97512 Non-pressure chronic ulcer of other part of right foot with fat layer exposed: Secondary | ICD-10-CM | POA: Diagnosis not present

## 2018-11-09 DIAGNOSIS — E039 Hypothyroidism, unspecified: Secondary | ICD-10-CM | POA: Diagnosis not present

## 2018-11-09 DIAGNOSIS — I110 Hypertensive heart disease with heart failure: Secondary | ICD-10-CM | POA: Diagnosis not present

## 2018-11-09 DIAGNOSIS — J449 Chronic obstructive pulmonary disease, unspecified: Secondary | ICD-10-CM | POA: Diagnosis not present

## 2018-11-09 DIAGNOSIS — F3342 Major depressive disorder, recurrent, in full remission: Secondary | ICD-10-CM | POA: Diagnosis not present

## 2018-11-09 DIAGNOSIS — I482 Chronic atrial fibrillation, unspecified: Secondary | ICD-10-CM | POA: Diagnosis not present

## 2018-11-09 DIAGNOSIS — E11621 Type 2 diabetes mellitus with foot ulcer: Secondary | ICD-10-CM | POA: Diagnosis not present

## 2018-11-09 DIAGNOSIS — Z7984 Long term (current) use of oral hypoglycemic drugs: Secondary | ICD-10-CM | POA: Diagnosis not present

## 2018-11-09 DIAGNOSIS — Z9181 History of falling: Secondary | ICD-10-CM | POA: Diagnosis not present

## 2018-11-10 DIAGNOSIS — I10 Essential (primary) hypertension: Secondary | ICD-10-CM | POA: Diagnosis not present

## 2018-11-10 DIAGNOSIS — I5032 Chronic diastolic (congestive) heart failure: Secondary | ICD-10-CM | POA: Diagnosis not present

## 2018-11-10 DIAGNOSIS — I482 Chronic atrial fibrillation, unspecified: Secondary | ICD-10-CM | POA: Diagnosis not present

## 2018-11-10 DIAGNOSIS — F3342 Major depressive disorder, recurrent, in full remission: Secondary | ICD-10-CM | POA: Diagnosis not present

## 2018-11-12 DIAGNOSIS — I110 Hypertensive heart disease with heart failure: Secondary | ICD-10-CM | POA: Diagnosis not present

## 2018-11-12 DIAGNOSIS — E11621 Type 2 diabetes mellitus with foot ulcer: Secondary | ICD-10-CM | POA: Diagnosis not present

## 2018-11-12 DIAGNOSIS — I482 Chronic atrial fibrillation, unspecified: Secondary | ICD-10-CM | POA: Diagnosis not present

## 2018-11-12 DIAGNOSIS — L97512 Non-pressure chronic ulcer of other part of right foot with fat layer exposed: Secondary | ICD-10-CM | POA: Diagnosis not present

## 2018-11-12 DIAGNOSIS — E1142 Type 2 diabetes mellitus with diabetic polyneuropathy: Secondary | ICD-10-CM | POA: Diagnosis not present

## 2018-11-12 DIAGNOSIS — I5032 Chronic diastolic (congestive) heart failure: Secondary | ICD-10-CM | POA: Diagnosis not present

## 2018-11-18 DIAGNOSIS — E1142 Type 2 diabetes mellitus with diabetic polyneuropathy: Secondary | ICD-10-CM | POA: Diagnosis not present

## 2018-11-19 DIAGNOSIS — L97512 Non-pressure chronic ulcer of other part of right foot with fat layer exposed: Secondary | ICD-10-CM | POA: Diagnosis not present

## 2018-11-19 DIAGNOSIS — E1142 Type 2 diabetes mellitus with diabetic polyneuropathy: Secondary | ICD-10-CM | POA: Diagnosis not present

## 2018-11-19 DIAGNOSIS — I482 Chronic atrial fibrillation, unspecified: Secondary | ICD-10-CM | POA: Diagnosis not present

## 2018-11-19 DIAGNOSIS — I110 Hypertensive heart disease with heart failure: Secondary | ICD-10-CM | POA: Diagnosis not present

## 2018-11-19 DIAGNOSIS — I5032 Chronic diastolic (congestive) heart failure: Secondary | ICD-10-CM | POA: Diagnosis not present

## 2018-11-19 DIAGNOSIS — E11621 Type 2 diabetes mellitus with foot ulcer: Secondary | ICD-10-CM | POA: Diagnosis not present

## 2018-11-22 DIAGNOSIS — L97522 Non-pressure chronic ulcer of other part of left foot with fat layer exposed: Secondary | ICD-10-CM | POA: Diagnosis not present

## 2018-11-22 DIAGNOSIS — E11621 Type 2 diabetes mellitus with foot ulcer: Secondary | ICD-10-CM | POA: Diagnosis not present

## 2018-11-22 DIAGNOSIS — L97512 Non-pressure chronic ulcer of other part of right foot with fat layer exposed: Secondary | ICD-10-CM | POA: Diagnosis not present

## 2018-11-26 DIAGNOSIS — I110 Hypertensive heart disease with heart failure: Secondary | ICD-10-CM | POA: Diagnosis not present

## 2018-11-26 DIAGNOSIS — E1142 Type 2 diabetes mellitus with diabetic polyneuropathy: Secondary | ICD-10-CM | POA: Diagnosis not present

## 2018-11-26 DIAGNOSIS — I482 Chronic atrial fibrillation, unspecified: Secondary | ICD-10-CM | POA: Diagnosis not present

## 2018-11-26 DIAGNOSIS — E11621 Type 2 diabetes mellitus with foot ulcer: Secondary | ICD-10-CM | POA: Diagnosis not present

## 2018-11-26 DIAGNOSIS — I5032 Chronic diastolic (congestive) heart failure: Secondary | ICD-10-CM | POA: Diagnosis not present

## 2018-11-26 DIAGNOSIS — L97512 Non-pressure chronic ulcer of other part of right foot with fat layer exposed: Secondary | ICD-10-CM | POA: Diagnosis not present

## 2018-11-30 DIAGNOSIS — I482 Chronic atrial fibrillation, unspecified: Secondary | ICD-10-CM | POA: Diagnosis not present

## 2018-11-30 DIAGNOSIS — R0609 Other forms of dyspnea: Secondary | ICD-10-CM | POA: Diagnosis not present

## 2018-11-30 DIAGNOSIS — F0390 Unspecified dementia without behavioral disturbance: Secondary | ICD-10-CM

## 2018-11-30 DIAGNOSIS — I951 Orthostatic hypotension: Secondary | ICD-10-CM | POA: Diagnosis not present

## 2018-11-30 DIAGNOSIS — F331 Major depressive disorder, recurrent, moderate: Secondary | ICD-10-CM | POA: Diagnosis not present

## 2018-11-30 DIAGNOSIS — F339 Major depressive disorder, recurrent, unspecified: Secondary | ICD-10-CM

## 2018-11-30 HISTORY — DX: Major depressive disorder, recurrent, unspecified: F33.9

## 2018-11-30 HISTORY — DX: Unspecified dementia, unspecified severity, without behavioral disturbance, psychotic disturbance, mood disturbance, and anxiety: F03.90

## 2018-12-03 DIAGNOSIS — L97512 Non-pressure chronic ulcer of other part of right foot with fat layer exposed: Secondary | ICD-10-CM | POA: Diagnosis not present

## 2018-12-03 DIAGNOSIS — I482 Chronic atrial fibrillation, unspecified: Secondary | ICD-10-CM | POA: Diagnosis not present

## 2018-12-03 DIAGNOSIS — E11621 Type 2 diabetes mellitus with foot ulcer: Secondary | ICD-10-CM | POA: Diagnosis not present

## 2018-12-03 DIAGNOSIS — E1142 Type 2 diabetes mellitus with diabetic polyneuropathy: Secondary | ICD-10-CM | POA: Diagnosis not present

## 2018-12-03 DIAGNOSIS — I110 Hypertensive heart disease with heart failure: Secondary | ICD-10-CM | POA: Diagnosis not present

## 2018-12-03 DIAGNOSIS — I5032 Chronic diastolic (congestive) heart failure: Secondary | ICD-10-CM | POA: Diagnosis not present

## 2018-12-06 DIAGNOSIS — Z7901 Long term (current) use of anticoagulants: Secondary | ICD-10-CM | POA: Diagnosis not present

## 2018-12-06 DIAGNOSIS — E039 Hypothyroidism, unspecified: Secondary | ICD-10-CM | POA: Diagnosis not present

## 2018-12-06 DIAGNOSIS — E11621 Type 2 diabetes mellitus with foot ulcer: Secondary | ICD-10-CM | POA: Diagnosis not present

## 2018-12-06 DIAGNOSIS — L97512 Non-pressure chronic ulcer of other part of right foot with fat layer exposed: Secondary | ICD-10-CM | POA: Diagnosis not present

## 2018-12-06 DIAGNOSIS — I482 Chronic atrial fibrillation, unspecified: Secondary | ICD-10-CM | POA: Diagnosis not present

## 2018-12-06 DIAGNOSIS — L97522 Non-pressure chronic ulcer of other part of left foot with fat layer exposed: Secondary | ICD-10-CM | POA: Diagnosis not present

## 2018-12-06 DIAGNOSIS — I10 Essential (primary) hypertension: Secondary | ICD-10-CM | POA: Diagnosis not present

## 2018-12-06 DIAGNOSIS — I5032 Chronic diastolic (congestive) heart failure: Secondary | ICD-10-CM | POA: Diagnosis not present

## 2018-12-06 DIAGNOSIS — Z6841 Body Mass Index (BMI) 40.0 and over, adult: Secondary | ICD-10-CM | POA: Diagnosis not present

## 2018-12-09 DIAGNOSIS — Z7984 Long term (current) use of oral hypoglycemic drugs: Secondary | ICD-10-CM | POA: Diagnosis not present

## 2018-12-09 DIAGNOSIS — E039 Hypothyroidism, unspecified: Secondary | ICD-10-CM | POA: Diagnosis not present

## 2018-12-09 DIAGNOSIS — Z7902 Long term (current) use of antithrombotics/antiplatelets: Secondary | ICD-10-CM | POA: Diagnosis not present

## 2018-12-09 DIAGNOSIS — E11621 Type 2 diabetes mellitus with foot ulcer: Secondary | ICD-10-CM | POA: Diagnosis not present

## 2018-12-09 DIAGNOSIS — E1142 Type 2 diabetes mellitus with diabetic polyneuropathy: Secondary | ICD-10-CM | POA: Diagnosis not present

## 2018-12-09 DIAGNOSIS — Z6841 Body Mass Index (BMI) 40.0 and over, adult: Secondary | ICD-10-CM | POA: Diagnosis not present

## 2018-12-09 DIAGNOSIS — Z7901 Long term (current) use of anticoagulants: Secondary | ICD-10-CM | POA: Diagnosis not present

## 2018-12-09 DIAGNOSIS — I5032 Chronic diastolic (congestive) heart failure: Secondary | ICD-10-CM | POA: Diagnosis not present

## 2018-12-09 DIAGNOSIS — F3342 Major depressive disorder, recurrent, in full remission: Secondary | ICD-10-CM | POA: Diagnosis not present

## 2018-12-09 DIAGNOSIS — I110 Hypertensive heart disease with heart failure: Secondary | ICD-10-CM | POA: Diagnosis not present

## 2018-12-09 DIAGNOSIS — I482 Chronic atrial fibrillation, unspecified: Secondary | ICD-10-CM | POA: Diagnosis not present

## 2018-12-09 DIAGNOSIS — Z9181 History of falling: Secondary | ICD-10-CM | POA: Diagnosis not present

## 2018-12-09 DIAGNOSIS — Z4800 Encounter for change or removal of nonsurgical wound dressing: Secondary | ICD-10-CM | POA: Diagnosis not present

## 2018-12-09 DIAGNOSIS — J449 Chronic obstructive pulmonary disease, unspecified: Secondary | ICD-10-CM | POA: Diagnosis not present

## 2018-12-09 DIAGNOSIS — L97512 Non-pressure chronic ulcer of other part of right foot with fat layer exposed: Secondary | ICD-10-CM | POA: Diagnosis not present

## 2018-12-10 DIAGNOSIS — L97512 Non-pressure chronic ulcer of other part of right foot with fat layer exposed: Secondary | ICD-10-CM | POA: Diagnosis not present

## 2018-12-10 DIAGNOSIS — I5032 Chronic diastolic (congestive) heart failure: Secondary | ICD-10-CM | POA: Diagnosis not present

## 2018-12-10 DIAGNOSIS — E1142 Type 2 diabetes mellitus with diabetic polyneuropathy: Secondary | ICD-10-CM | POA: Diagnosis not present

## 2018-12-10 DIAGNOSIS — E11621 Type 2 diabetes mellitus with foot ulcer: Secondary | ICD-10-CM | POA: Diagnosis not present

## 2018-12-10 DIAGNOSIS — I482 Chronic atrial fibrillation, unspecified: Secondary | ICD-10-CM | POA: Diagnosis not present

## 2018-12-10 DIAGNOSIS — I110 Hypertensive heart disease with heart failure: Secondary | ICD-10-CM | POA: Diagnosis not present

## 2018-12-13 DIAGNOSIS — L97512 Non-pressure chronic ulcer of other part of right foot with fat layer exposed: Secondary | ICD-10-CM | POA: Diagnosis not present

## 2018-12-13 DIAGNOSIS — I482 Chronic atrial fibrillation, unspecified: Secondary | ICD-10-CM | POA: Diagnosis not present

## 2018-12-13 DIAGNOSIS — E1142 Type 2 diabetes mellitus with diabetic polyneuropathy: Secondary | ICD-10-CM | POA: Diagnosis not present

## 2018-12-13 DIAGNOSIS — I110 Hypertensive heart disease with heart failure: Secondary | ICD-10-CM | POA: Diagnosis not present

## 2018-12-13 DIAGNOSIS — I5032 Chronic diastolic (congestive) heart failure: Secondary | ICD-10-CM | POA: Diagnosis not present

## 2018-12-13 DIAGNOSIS — E11621 Type 2 diabetes mellitus with foot ulcer: Secondary | ICD-10-CM | POA: Diagnosis not present

## 2018-12-14 DIAGNOSIS — Z7901 Long term (current) use of anticoagulants: Secondary | ICD-10-CM | POA: Diagnosis not present

## 2018-12-17 DIAGNOSIS — I5032 Chronic diastolic (congestive) heart failure: Secondary | ICD-10-CM | POA: Diagnosis not present

## 2018-12-17 DIAGNOSIS — E1142 Type 2 diabetes mellitus with diabetic polyneuropathy: Secondary | ICD-10-CM | POA: Diagnosis not present

## 2018-12-17 DIAGNOSIS — E11621 Type 2 diabetes mellitus with foot ulcer: Secondary | ICD-10-CM | POA: Diagnosis not present

## 2018-12-17 DIAGNOSIS — I110 Hypertensive heart disease with heart failure: Secondary | ICD-10-CM | POA: Diagnosis not present

## 2018-12-17 DIAGNOSIS — I482 Chronic atrial fibrillation, unspecified: Secondary | ICD-10-CM | POA: Diagnosis not present

## 2018-12-17 DIAGNOSIS — L97512 Non-pressure chronic ulcer of other part of right foot with fat layer exposed: Secondary | ICD-10-CM | POA: Diagnosis not present

## 2018-12-23 DIAGNOSIS — F3342 Major depressive disorder, recurrent, in full remission: Secondary | ICD-10-CM | POA: Diagnosis not present

## 2018-12-24 DIAGNOSIS — I482 Chronic atrial fibrillation, unspecified: Secondary | ICD-10-CM | POA: Diagnosis not present

## 2018-12-24 DIAGNOSIS — L97512 Non-pressure chronic ulcer of other part of right foot with fat layer exposed: Secondary | ICD-10-CM | POA: Diagnosis not present

## 2018-12-24 DIAGNOSIS — E11621 Type 2 diabetes mellitus with foot ulcer: Secondary | ICD-10-CM | POA: Diagnosis not present

## 2018-12-24 DIAGNOSIS — E1142 Type 2 diabetes mellitus with diabetic polyneuropathy: Secondary | ICD-10-CM | POA: Diagnosis not present

## 2018-12-24 DIAGNOSIS — I110 Hypertensive heart disease with heart failure: Secondary | ICD-10-CM | POA: Diagnosis not present

## 2018-12-24 DIAGNOSIS — I5032 Chronic diastolic (congestive) heart failure: Secondary | ICD-10-CM | POA: Diagnosis not present

## 2018-12-27 DIAGNOSIS — L97512 Non-pressure chronic ulcer of other part of right foot with fat layer exposed: Secondary | ICD-10-CM | POA: Diagnosis not present

## 2018-12-27 DIAGNOSIS — E11621 Type 2 diabetes mellitus with foot ulcer: Secondary | ICD-10-CM | POA: Diagnosis not present

## 2018-12-27 DIAGNOSIS — L97522 Non-pressure chronic ulcer of other part of left foot with fat layer exposed: Secondary | ICD-10-CM | POA: Diagnosis not present

## 2018-12-31 DIAGNOSIS — E11621 Type 2 diabetes mellitus with foot ulcer: Secondary | ICD-10-CM | POA: Diagnosis not present

## 2018-12-31 DIAGNOSIS — I482 Chronic atrial fibrillation, unspecified: Secondary | ICD-10-CM | POA: Diagnosis not present

## 2018-12-31 DIAGNOSIS — I5032 Chronic diastolic (congestive) heart failure: Secondary | ICD-10-CM | POA: Diagnosis not present

## 2018-12-31 DIAGNOSIS — I110 Hypertensive heart disease with heart failure: Secondary | ICD-10-CM | POA: Diagnosis not present

## 2018-12-31 DIAGNOSIS — L97512 Non-pressure chronic ulcer of other part of right foot with fat layer exposed: Secondary | ICD-10-CM | POA: Diagnosis not present

## 2018-12-31 DIAGNOSIS — E1142 Type 2 diabetes mellitus with diabetic polyneuropathy: Secondary | ICD-10-CM | POA: Diagnosis not present

## 2019-01-03 DIAGNOSIS — E11621 Type 2 diabetes mellitus with foot ulcer: Secondary | ICD-10-CM | POA: Diagnosis not present

## 2019-01-03 DIAGNOSIS — L97522 Non-pressure chronic ulcer of other part of left foot with fat layer exposed: Secondary | ICD-10-CM | POA: Diagnosis not present

## 2019-01-03 DIAGNOSIS — L97512 Non-pressure chronic ulcer of other part of right foot with fat layer exposed: Secondary | ICD-10-CM | POA: Diagnosis not present

## 2019-01-07 DIAGNOSIS — E11621 Type 2 diabetes mellitus with foot ulcer: Secondary | ICD-10-CM | POA: Diagnosis not present

## 2019-01-07 DIAGNOSIS — I482 Chronic atrial fibrillation, unspecified: Secondary | ICD-10-CM | POA: Diagnosis not present

## 2019-01-07 DIAGNOSIS — I110 Hypertensive heart disease with heart failure: Secondary | ICD-10-CM | POA: Diagnosis not present

## 2019-01-07 DIAGNOSIS — E1142 Type 2 diabetes mellitus with diabetic polyneuropathy: Secondary | ICD-10-CM | POA: Diagnosis not present

## 2019-01-07 DIAGNOSIS — L97512 Non-pressure chronic ulcer of other part of right foot with fat layer exposed: Secondary | ICD-10-CM | POA: Diagnosis not present

## 2019-01-07 DIAGNOSIS — I5032 Chronic diastolic (congestive) heart failure: Secondary | ICD-10-CM | POA: Diagnosis not present

## 2019-01-08 DIAGNOSIS — E11621 Type 2 diabetes mellitus with foot ulcer: Secondary | ICD-10-CM | POA: Diagnosis not present

## 2019-01-08 DIAGNOSIS — Z7901 Long term (current) use of anticoagulants: Secondary | ICD-10-CM | POA: Diagnosis not present

## 2019-01-08 DIAGNOSIS — I482 Chronic atrial fibrillation, unspecified: Secondary | ICD-10-CM | POA: Diagnosis not present

## 2019-01-08 DIAGNOSIS — I110 Hypertensive heart disease with heart failure: Secondary | ICD-10-CM | POA: Diagnosis not present

## 2019-01-08 DIAGNOSIS — L97512 Non-pressure chronic ulcer of other part of right foot with fat layer exposed: Secondary | ICD-10-CM | POA: Diagnosis not present

## 2019-01-08 DIAGNOSIS — E1142 Type 2 diabetes mellitus with diabetic polyneuropathy: Secondary | ICD-10-CM | POA: Diagnosis not present

## 2019-01-08 DIAGNOSIS — Z7902 Long term (current) use of antithrombotics/antiplatelets: Secondary | ICD-10-CM | POA: Diagnosis not present

## 2019-01-08 DIAGNOSIS — Z4800 Encounter for change or removal of nonsurgical wound dressing: Secondary | ICD-10-CM | POA: Diagnosis not present

## 2019-01-08 DIAGNOSIS — Z9181 History of falling: Secondary | ICD-10-CM | POA: Diagnosis not present

## 2019-01-08 DIAGNOSIS — Z7984 Long term (current) use of oral hypoglycemic drugs: Secondary | ICD-10-CM | POA: Diagnosis not present

## 2019-01-08 DIAGNOSIS — J449 Chronic obstructive pulmonary disease, unspecified: Secondary | ICD-10-CM | POA: Diagnosis not present

## 2019-01-08 DIAGNOSIS — Z6841 Body Mass Index (BMI) 40.0 and over, adult: Secondary | ICD-10-CM | POA: Diagnosis not present

## 2019-01-08 DIAGNOSIS — F3342 Major depressive disorder, recurrent, in full remission: Secondary | ICD-10-CM | POA: Diagnosis not present

## 2019-01-08 DIAGNOSIS — I5032 Chronic diastolic (congestive) heart failure: Secondary | ICD-10-CM | POA: Diagnosis not present

## 2019-01-08 DIAGNOSIS — E039 Hypothyroidism, unspecified: Secondary | ICD-10-CM | POA: Diagnosis not present

## 2019-01-10 DIAGNOSIS — E11621 Type 2 diabetes mellitus with foot ulcer: Secondary | ICD-10-CM | POA: Diagnosis not present

## 2019-01-10 DIAGNOSIS — L97512 Non-pressure chronic ulcer of other part of right foot with fat layer exposed: Secondary | ICD-10-CM | POA: Diagnosis not present

## 2019-01-10 DIAGNOSIS — Z7901 Long term (current) use of anticoagulants: Secondary | ICD-10-CM | POA: Diagnosis not present

## 2019-01-14 DIAGNOSIS — I5032 Chronic diastolic (congestive) heart failure: Secondary | ICD-10-CM | POA: Diagnosis not present

## 2019-01-14 DIAGNOSIS — L97512 Non-pressure chronic ulcer of other part of right foot with fat layer exposed: Secondary | ICD-10-CM | POA: Diagnosis not present

## 2019-01-14 DIAGNOSIS — E11621 Type 2 diabetes mellitus with foot ulcer: Secondary | ICD-10-CM | POA: Diagnosis not present

## 2019-01-14 DIAGNOSIS — E1142 Type 2 diabetes mellitus with diabetic polyneuropathy: Secondary | ICD-10-CM | POA: Diagnosis not present

## 2019-01-14 DIAGNOSIS — I110 Hypertensive heart disease with heart failure: Secondary | ICD-10-CM | POA: Diagnosis not present

## 2019-01-14 DIAGNOSIS — I482 Chronic atrial fibrillation, unspecified: Secondary | ICD-10-CM | POA: Diagnosis not present

## 2019-01-17 DIAGNOSIS — L97512 Non-pressure chronic ulcer of other part of right foot with fat layer exposed: Secondary | ICD-10-CM | POA: Diagnosis not present

## 2019-01-17 DIAGNOSIS — E11621 Type 2 diabetes mellitus with foot ulcer: Secondary | ICD-10-CM | POA: Diagnosis not present

## 2019-01-19 DIAGNOSIS — F339 Major depressive disorder, recurrent, unspecified: Secondary | ICD-10-CM | POA: Diagnosis not present

## 2019-01-19 DIAGNOSIS — I10 Essential (primary) hypertension: Secondary | ICD-10-CM | POA: Diagnosis not present

## 2019-01-19 DIAGNOSIS — R0609 Other forms of dyspnea: Secondary | ICD-10-CM | POA: Diagnosis not present

## 2019-01-19 DIAGNOSIS — E039 Hypothyroidism, unspecified: Secondary | ICD-10-CM | POA: Diagnosis not present

## 2019-01-19 DIAGNOSIS — I5032 Chronic diastolic (congestive) heart failure: Secondary | ICD-10-CM | POA: Diagnosis not present

## 2019-01-19 DIAGNOSIS — E1142 Type 2 diabetes mellitus with diabetic polyneuropathy: Secondary | ICD-10-CM | POA: Diagnosis not present

## 2019-01-19 DIAGNOSIS — R5383 Other fatigue: Secondary | ICD-10-CM | POA: Diagnosis not present

## 2019-01-21 DIAGNOSIS — I482 Chronic atrial fibrillation, unspecified: Secondary | ICD-10-CM | POA: Diagnosis not present

## 2019-01-21 DIAGNOSIS — I5032 Chronic diastolic (congestive) heart failure: Secondary | ICD-10-CM | POA: Diagnosis not present

## 2019-01-21 DIAGNOSIS — E1142 Type 2 diabetes mellitus with diabetic polyneuropathy: Secondary | ICD-10-CM | POA: Diagnosis not present

## 2019-01-21 DIAGNOSIS — E11621 Type 2 diabetes mellitus with foot ulcer: Secondary | ICD-10-CM | POA: Diagnosis not present

## 2019-01-21 DIAGNOSIS — I110 Hypertensive heart disease with heart failure: Secondary | ICD-10-CM | POA: Diagnosis not present

## 2019-01-21 DIAGNOSIS — L97512 Non-pressure chronic ulcer of other part of right foot with fat layer exposed: Secondary | ICD-10-CM | POA: Diagnosis not present

## 2019-01-24 ENCOUNTER — Other Ambulatory Visit: Payer: Self-pay

## 2019-01-24 DIAGNOSIS — Z7901 Long term (current) use of anticoagulants: Secondary | ICD-10-CM | POA: Diagnosis not present

## 2019-01-24 DIAGNOSIS — Z981 Arthrodesis status: Secondary | ICD-10-CM | POA: Diagnosis not present

## 2019-01-24 DIAGNOSIS — E039 Hypothyroidism, unspecified: Secondary | ICD-10-CM | POA: Diagnosis not present

## 2019-01-24 DIAGNOSIS — E11621 Type 2 diabetes mellitus with foot ulcer: Secondary | ICD-10-CM | POA: Diagnosis not present

## 2019-01-24 DIAGNOSIS — I10 Essential (primary) hypertension: Secondary | ICD-10-CM | POA: Diagnosis not present

## 2019-01-24 DIAGNOSIS — I5032 Chronic diastolic (congestive) heart failure: Secondary | ICD-10-CM | POA: Diagnosis not present

## 2019-01-24 DIAGNOSIS — Z6841 Body Mass Index (BMI) 40.0 and over, adult: Secondary | ICD-10-CM | POA: Diagnosis not present

## 2019-01-24 DIAGNOSIS — I482 Chronic atrial fibrillation, unspecified: Secondary | ICD-10-CM | POA: Diagnosis not present

## 2019-01-24 DIAGNOSIS — L97512 Non-pressure chronic ulcer of other part of right foot with fat layer exposed: Secondary | ICD-10-CM | POA: Diagnosis not present

## 2019-01-24 DIAGNOSIS — M2041 Other hammer toe(s) (acquired), right foot: Secondary | ICD-10-CM | POA: Diagnosis not present

## 2019-01-24 DIAGNOSIS — E119 Type 2 diabetes mellitus without complications: Secondary | ICD-10-CM | POA: Diagnosis not present

## 2019-01-31 DIAGNOSIS — L97512 Non-pressure chronic ulcer of other part of right foot with fat layer exposed: Secondary | ICD-10-CM | POA: Diagnosis not present

## 2019-01-31 DIAGNOSIS — I482 Chronic atrial fibrillation, unspecified: Secondary | ICD-10-CM | POA: Diagnosis not present

## 2019-01-31 DIAGNOSIS — E039 Hypothyroidism, unspecified: Secondary | ICD-10-CM | POA: Diagnosis not present

## 2019-01-31 DIAGNOSIS — E11621 Type 2 diabetes mellitus with foot ulcer: Secondary | ICD-10-CM | POA: Diagnosis not present

## 2019-02-07 DIAGNOSIS — I5032 Chronic diastolic (congestive) heart failure: Secondary | ICD-10-CM | POA: Diagnosis not present

## 2019-02-07 DIAGNOSIS — F3342 Major depressive disorder, recurrent, in full remission: Secondary | ICD-10-CM | POA: Diagnosis not present

## 2019-02-07 DIAGNOSIS — E039 Hypothyroidism, unspecified: Secondary | ICD-10-CM | POA: Diagnosis not present

## 2019-02-07 DIAGNOSIS — Z4800 Encounter for change or removal of nonsurgical wound dressing: Secondary | ICD-10-CM | POA: Diagnosis not present

## 2019-02-07 DIAGNOSIS — L97512 Non-pressure chronic ulcer of other part of right foot with fat layer exposed: Secondary | ICD-10-CM | POA: Diagnosis not present

## 2019-02-07 DIAGNOSIS — E1142 Type 2 diabetes mellitus with diabetic polyneuropathy: Secondary | ICD-10-CM | POA: Diagnosis not present

## 2019-02-07 DIAGNOSIS — Z7902 Long term (current) use of antithrombotics/antiplatelets: Secondary | ICD-10-CM | POA: Diagnosis not present

## 2019-02-07 DIAGNOSIS — Z6841 Body Mass Index (BMI) 40.0 and over, adult: Secondary | ICD-10-CM | POA: Diagnosis not present

## 2019-02-07 DIAGNOSIS — E11621 Type 2 diabetes mellitus with foot ulcer: Secondary | ICD-10-CM | POA: Diagnosis not present

## 2019-02-07 DIAGNOSIS — I482 Chronic atrial fibrillation, unspecified: Secondary | ICD-10-CM | POA: Diagnosis not present

## 2019-02-07 DIAGNOSIS — J449 Chronic obstructive pulmonary disease, unspecified: Secondary | ICD-10-CM | POA: Diagnosis not present

## 2019-02-07 DIAGNOSIS — Z7901 Long term (current) use of anticoagulants: Secondary | ICD-10-CM | POA: Diagnosis not present

## 2019-02-07 DIAGNOSIS — Z9181 History of falling: Secondary | ICD-10-CM | POA: Diagnosis not present

## 2019-02-07 DIAGNOSIS — Z7984 Long term (current) use of oral hypoglycemic drugs: Secondary | ICD-10-CM | POA: Diagnosis not present

## 2019-02-07 DIAGNOSIS — I110 Hypertensive heart disease with heart failure: Secondary | ICD-10-CM | POA: Diagnosis not present

## 2019-02-08 DIAGNOSIS — E1142 Type 2 diabetes mellitus with diabetic polyneuropathy: Secondary | ICD-10-CM | POA: Diagnosis not present

## 2019-02-08 DIAGNOSIS — L97512 Non-pressure chronic ulcer of other part of right foot with fat layer exposed: Secondary | ICD-10-CM | POA: Diagnosis not present

## 2019-02-08 DIAGNOSIS — I110 Hypertensive heart disease with heart failure: Secondary | ICD-10-CM | POA: Diagnosis not present

## 2019-02-08 DIAGNOSIS — E11621 Type 2 diabetes mellitus with foot ulcer: Secondary | ICD-10-CM | POA: Diagnosis not present

## 2019-02-08 DIAGNOSIS — I482 Chronic atrial fibrillation, unspecified: Secondary | ICD-10-CM | POA: Diagnosis not present

## 2019-02-08 DIAGNOSIS — I5032 Chronic diastolic (congestive) heart failure: Secondary | ICD-10-CM | POA: Diagnosis not present

## 2019-02-11 DIAGNOSIS — I5032 Chronic diastolic (congestive) heart failure: Secondary | ICD-10-CM | POA: Diagnosis not present

## 2019-02-11 DIAGNOSIS — L97512 Non-pressure chronic ulcer of other part of right foot with fat layer exposed: Secondary | ICD-10-CM | POA: Diagnosis not present

## 2019-02-11 DIAGNOSIS — E11621 Type 2 diabetes mellitus with foot ulcer: Secondary | ICD-10-CM | POA: Diagnosis not present

## 2019-02-11 DIAGNOSIS — E1142 Type 2 diabetes mellitus with diabetic polyneuropathy: Secondary | ICD-10-CM | POA: Diagnosis not present

## 2019-02-11 DIAGNOSIS — I110 Hypertensive heart disease with heart failure: Secondary | ICD-10-CM | POA: Diagnosis not present

## 2019-02-11 DIAGNOSIS — I482 Chronic atrial fibrillation, unspecified: Secondary | ICD-10-CM | POA: Diagnosis not present

## 2019-02-14 DIAGNOSIS — B952 Enterococcus as the cause of diseases classified elsewhere: Secondary | ICD-10-CM | POA: Diagnosis not present

## 2019-02-14 DIAGNOSIS — L97512 Non-pressure chronic ulcer of other part of right foot with fat layer exposed: Secondary | ICD-10-CM | POA: Diagnosis not present

## 2019-02-14 DIAGNOSIS — I482 Chronic atrial fibrillation, unspecified: Secondary | ICD-10-CM | POA: Diagnosis not present

## 2019-02-14 DIAGNOSIS — Z7901 Long term (current) use of anticoagulants: Secondary | ICD-10-CM | POA: Diagnosis not present

## 2019-02-14 DIAGNOSIS — Z6841 Body Mass Index (BMI) 40.0 and over, adult: Secondary | ICD-10-CM | POA: Diagnosis not present

## 2019-02-14 DIAGNOSIS — E11621 Type 2 diabetes mellitus with foot ulcer: Secondary | ICD-10-CM | POA: Diagnosis not present

## 2019-02-14 DIAGNOSIS — I5032 Chronic diastolic (congestive) heart failure: Secondary | ICD-10-CM | POA: Diagnosis not present

## 2019-02-14 DIAGNOSIS — E039 Hypothyroidism, unspecified: Secondary | ICD-10-CM | POA: Diagnosis not present

## 2019-02-14 DIAGNOSIS — I10 Essential (primary) hypertension: Secondary | ICD-10-CM | POA: Diagnosis not present

## 2019-02-18 DIAGNOSIS — E1142 Type 2 diabetes mellitus with diabetic polyneuropathy: Secondary | ICD-10-CM | POA: Diagnosis not present

## 2019-02-18 DIAGNOSIS — E11621 Type 2 diabetes mellitus with foot ulcer: Secondary | ICD-10-CM | POA: Diagnosis not present

## 2019-02-18 DIAGNOSIS — I5032 Chronic diastolic (congestive) heart failure: Secondary | ICD-10-CM | POA: Diagnosis not present

## 2019-02-18 DIAGNOSIS — L97512 Non-pressure chronic ulcer of other part of right foot with fat layer exposed: Secondary | ICD-10-CM | POA: Diagnosis not present

## 2019-02-18 DIAGNOSIS — I110 Hypertensive heart disease with heart failure: Secondary | ICD-10-CM | POA: Diagnosis not present

## 2019-02-18 DIAGNOSIS — I482 Chronic atrial fibrillation, unspecified: Secondary | ICD-10-CM | POA: Diagnosis not present

## 2019-02-21 DIAGNOSIS — L97512 Non-pressure chronic ulcer of other part of right foot with fat layer exposed: Secondary | ICD-10-CM | POA: Diagnosis not present

## 2019-02-21 DIAGNOSIS — E039 Hypothyroidism, unspecified: Secondary | ICD-10-CM | POA: Diagnosis not present

## 2019-02-21 DIAGNOSIS — Z7901 Long term (current) use of anticoagulants: Secondary | ICD-10-CM | POA: Diagnosis not present

## 2019-02-21 DIAGNOSIS — I5032 Chronic diastolic (congestive) heart failure: Secondary | ICD-10-CM | POA: Diagnosis not present

## 2019-02-21 DIAGNOSIS — E11621 Type 2 diabetes mellitus with foot ulcer: Secondary | ICD-10-CM | POA: Diagnosis not present

## 2019-02-21 DIAGNOSIS — I482 Chronic atrial fibrillation, unspecified: Secondary | ICD-10-CM | POA: Diagnosis not present

## 2019-02-21 DIAGNOSIS — Z6841 Body Mass Index (BMI) 40.0 and over, adult: Secondary | ICD-10-CM | POA: Diagnosis not present

## 2019-02-21 DIAGNOSIS — I10 Essential (primary) hypertension: Secondary | ICD-10-CM | POA: Diagnosis not present

## 2019-02-22 DIAGNOSIS — C3492 Malignant neoplasm of unspecified part of left bronchus or lung: Secondary | ICD-10-CM | POA: Diagnosis not present

## 2019-02-22 DIAGNOSIS — R0602 Shortness of breath: Secondary | ICD-10-CM | POA: Diagnosis not present

## 2019-02-22 DIAGNOSIS — Z902 Acquired absence of lung [part of]: Secondary | ICD-10-CM | POA: Diagnosis not present

## 2019-02-22 DIAGNOSIS — C3432 Malignant neoplasm of lower lobe, left bronchus or lung: Secondary | ICD-10-CM | POA: Diagnosis not present

## 2019-02-23 ENCOUNTER — Ambulatory Visit (INDEPENDENT_AMBULATORY_CARE_PROVIDER_SITE_OTHER): Payer: Medicare Other | Admitting: Cardiology

## 2019-02-23 ENCOUNTER — Other Ambulatory Visit: Payer: Self-pay

## 2019-02-23 VITALS — BP 100/60 | HR 109 | Ht 71.0 in | Wt 281.4 lb

## 2019-02-23 DIAGNOSIS — I10 Essential (primary) hypertension: Secondary | ICD-10-CM | POA: Diagnosis not present

## 2019-02-23 DIAGNOSIS — C3492 Malignant neoplasm of unspecified part of left bronchus or lung: Secondary | ICD-10-CM | POA: Diagnosis not present

## 2019-02-23 DIAGNOSIS — Z79899 Other long term (current) drug therapy: Secondary | ICD-10-CM

## 2019-02-23 DIAGNOSIS — I482 Chronic atrial fibrillation, unspecified: Secondary | ICD-10-CM | POA: Diagnosis not present

## 2019-02-23 DIAGNOSIS — I25119 Atherosclerotic heart disease of native coronary artery with unspecified angina pectoris: Secondary | ICD-10-CM | POA: Diagnosis not present

## 2019-02-23 DIAGNOSIS — I2510 Atherosclerotic heart disease of native coronary artery without angina pectoris: Secondary | ICD-10-CM

## 2019-02-23 DIAGNOSIS — Z5181 Encounter for therapeutic drug level monitoring: Secondary | ICD-10-CM

## 2019-02-23 NOTE — Progress Notes (Signed)
Cardiology Office Note:    Date:  02/23/2019   ID:  Travis Watts, DOB [birth year], MRN [id number]  PCP:  Cyndy Freeze, MD  Cardiologist:  [name], MD    Referring MD: Cyndy Freeze, MD   Chief Complaint  Patient presents with  . Follow-up  I am weak and tired  History of Present Illness:    Travis Watts is a 73 y.o. male with chronic/permanent atrial fibrillation, essential hypertension, remote coronary artery disease comes today to my office for follow-up.  Biggest complaint is weakness and fatigue.  He also noted that sometimes his heart rate measured by checking pulse oximetry slow down to 45 and he is concerned about it.  Past Medical History:  Diagnosis Date  . A-fib ([place])   . Anxiety   . Arthritis    previous to joint replacement - knees & shoulder.  Pt.'s R foot fused, traumatized from exposure to agent ORANGE  . Cancer ([place])    lung  . CHF (congestive heart failure) ([place])   . Coronary artery disease   . Depression   . Diabetes mellitus without complication ([place])    diagnosed- 19yrs.   Marland Kitchen Dyspnea   . Dysrhythmia   . GERD (gastroesophageal reflux disease)   . Hyperlipidemia   . Hypertension   . Neuromuscular disorder (HCC)    neuropathy- feet & legs  . Pneumonia    several times, but treated from his home, not hosp.   . Sleep apnea    not able to CPAP, since lung beiing removed- wife reports no problem.   . Stroke (cerebrum) [facility])    as a 73 y.o.     Past Surgical History:  Procedure Laterality Date  . ANKLE FUSION    . CARDIAC CATHETERIZATION    . EYE SURGERY Left    cataract removed   . JOINT REPLACEMENT Left 2016   shoulder replacement   . LUNG LOBECTOMY Left 2016   done at [facility]    . SKIN CANCER EXCISION      Current Medications: Current Meds  Medication Sig  . ARIPiprazole (ABILIFY) 5 MG tablet Take 1 tablet by mouth daily.  Marland Kitchen atorvastatin (LIPITOR) 80 MG tablet Take 40 mg by  mouth daily.   . Cholecalciferol (VITAMIN D) 2000 units tablet Take 2,000 Units by mouth daily.   . clopidogrel (PLAVIX) 75 MG tablet Take 1 tablet by mouth daily.  . cyanocobalamin (,VITAMIN B-12,) 1000 MCG/ML injection Inject 1 mL into the skin every 30 (thirty) days.  . digoxin (LANOXIN) 0.25 MG tablet Take 0.25 mg by mouth at bedtime.   . DULoxetine (CYMBALTA) 60 MG capsule Take 60 mg by mouth 2 (two) times daily.   . Empagliflozin-linaGLIPtin (GLYXAMBI) 25-5 MG TABS Take 1 tablet by mouth daily.  . fludrocortisone (FLORINEF) 0.1 MG tablet Take 100 mcg by mouth daily.  . furosemide (LASIX) 80 MG tablet Take 80 mg by mouth as needed.   . gabapentin (NEURONTIN) 400 MG capsule Take 400-800 mg by mouth See admin instructions. Take 400 mg by mouth in the morning and take 800 mg by mouth at bedtime  . glipiZIDE (GLUCOTROL) 10 MG tablet Take 10 mg by mouth 2 (two) times daily.  Marland Kitchen levothyroxine (SYNTHROID) 100 MCG tablet Take 1 tablet by mouth daily.  Marland Kitchen lisinopril (PRINIVIL,ZESTRIL) 20 MG tablet Take 20 mg by mouth daily.  . magnesium oxide (MAG-OX) 400 MG tablet Take 400 mg by mouth  daily.  . metoprolol tartrate (LOPRESSOR) 50 MG tablet Take 25 mg by mouth 2 (two) times daily.  Marland Kitchen omeprazole (PRILOSEC) 20 MG capsule Take 20 mg by mouth 2 (two) times daily.  . QUEtiapine (SEROQUEL) 100 MG tablet Take 150 mg by mouth at bedtime.   . solifenacin (VESICARE) 5 MG tablet Take 5 mg by mouth daily.   . tamsulosin (FLOMAX) 0.4 MG CAPS capsule Take 0.8 mg by mouth at bedtime.   Marland Kitchen warfarin (COUMADIN) 5 MG tablet Take 7.5 mg alt with 10 mg     Allergies:   Patient has no known allergies.   Social History   Socioeconomic History  . Marital status: Married    Spouse name: Not on file  . Number of children: Not on file  . Years of education: Not on file  . Highest education level: Not on file  Occupational History  . Not on file  Social Needs  . Financial resource strain: Not on file  . Food  insecurity    Worry: Not on file    Inability: Not on file  . Transportation needs    Medical: Not on file    Non-medical: Not on file  Tobacco Use  . Smoking status: Former Smoker    Quit date: 06/18/1994    Years since quitting: 24.7  . Smokeless tobacco: Never Used  Substance and Sexual Activity  . Alcohol use: No  . Drug use: No  . Sexual activity: Not on file  Lifestyle  . Physical activity    Days per week: Not on file    Minutes per session: Not on file  . Stress: Not on file  Relationships  . Social Herbalist on phone: Not on file    Gets together: Not on file    Attends religious service: Not on file    Active member of club or organization: Not on file    Attends meetings of clubs or organizations: Not on file    Relationship status: Not on file  Other Topics Concern  . Not on file  Social History Narrative  . Not on file     Family History: The patient's family history includes Depression in his mother; Early death in his brother, father, maternal grandmother, and maternal uncle; Heart attack in his brother, father, and paternal uncle; Heart disease in his brother and father; Heart failure in his brother and father; Hyperlipidemia in his brother and father; Hypertension in his brother and father; Osteoarthritis in his father; Rheum arthritis in his father; Stroke in his maternal grandmother and maternal uncle. ROS:   Please see the history of present illness.    All 14 point review of systems negative except as described per history of present illness  EKGs/Labs/Other Studies Reviewed:      Recent Labs: No results found for requested labs within last 8760 hours.  Recent Lipid Panel No results found for: CHOL, TRIG, HDL, CHOLHDL, VLDL, LDLCALC, LDLDIRECT  Physical Exam:    VS:  BP 100/60   Pulse (!) 109   Ht 5\' 11"  (1.803 m)   Wt 281 lb 6.4 oz (127.6 kg)   SpO2 99%   BMI 39.25 kg/m     Wt Readings from Last 3 Encounters:  02/23/19 281  lb 6.4 oz (127.6 kg)  04/05/18 (!) 303 lb (137.4 kg)  10/19/17 (!) 300 lb 1.9 oz (136.1 kg)     GEN:  Well nourished, well developed in no acute distress HEENT:  Normal NECK: No JVD; No carotid bruits LYMPHATICS: No lymphadenopathy CARDIAC: Irregularly irregular, no murmurs, no rubs, no gallops RESPIRATORY:  Clear to auscultation without rales, wheezing or rhonchi  ABDOMEN: Soft, non-tender, non-distended MUSCULOSKELETAL:  No edema; No deformity  SKIN: Warm and dry LOWER EXTREMITIES: no swelling NEUROLOGIC:  Alert and oriented x 3 PSYCHIATRIC:  Normal affect   ASSESSMENT:    1. Coronary artery disease involving native coronary artery of native heart without angina pectoris   2. Encounter for monitoring digoxin therapy   3. Essential hypertension   4. Chronic atrial fibrillation   5. Atherosclerosis of native coronary artery of native heart with angina pectoris ([place])   6. Small cell carcinoma of left lung (HCC)    PLAN:    In order of problems listed above:  1. Coronary disease stable on appropriate medication denies having recent problems. 2. He is on the digoxin will check his level today.  I am worried the dose of 0.25 is too high. 3. Permanent atrial fibrillation.  Rate control anticoagulated which I will continue. 4. Bradycardia is concern is that his heart rate is too slow.  I will ask him to wear monitor.  EKG done today showed atrial fibrillation with slow ventricular rate and low voltage. 5. Small cell CA shortness of breath is the leading symptoms.  I will ask him to have an echocardiogram done to make sure that shortness of breath is not related to his heart   Medication Adjustments/Labs and Tests Ordered: Current medicines are reviewed at length with the patient today.  Concerns regarding medicines are outlined above.  Orders Placed This Encounter  Procedures  . Digitoxin level  . Basic metabolic panel  . EKG 12-Lead  . ECHOCARDIOGRAM COMPLETE   Medication  changes: No orders of the defined types were placed in this encounter.   Signed, Park Liter, MD, [facility] 02/23/2019 11:43 AM    [place]

## 2019-02-23 NOTE — Addendum Note (Signed)
Addended by: Ashok Norris on: 02/23/2019 12:14 PM   Modules accepted: Orders

## 2019-02-23 NOTE — Patient Instructions (Signed)
Medication Instructions:  Your physician recommends that you continue on your current medications as directed. Please refer to the Current Medication list given to you today.  If you need a refill on your cardiac medications before your next appointment, please call your pharmacy.   Lab work: Your physician recommends that you return for lab work today: Digoxin level, bmp   If you have labs (blood work) drawn today and your tests are completely normal, you will receive your results only by: Marland Kitchen MyChart Message (if you have MyChart) OR . A paper copy in the mail If you have any lab test that is abnormal or we need to change your treatment, we will call you to review the results.  Testing/Procedures: Your physician has requested that you have an echocardiogram. Echocardiography is a painless test that uses sound waves to create images of your heart. It provides your doctor with information about the size and shape of your heart and how well your heart's chambers and valves are working. This procedure takes approximately one hour. There are no restrictions for this procedure.    Follow-Up: At [facility], you and your health needs are our priority.  As part of our continuing mission to provide you with exceptional heart care, we have created designated Provider Care Teams.  These Care Teams include your primary Cardiologist (physician) and Advanced Practice Providers (APPs -  Physician Assistants and Nurse Practitioners) who all work together to provide you with the care you need, when you need it. You will need a follow up appointment in 1 months.  Please call our office 2 months in advance to schedule this appointment.  You may see [name], MD or another member of our [place] Provider Team in [place]: Shirlee More, MD . Jyl Heinz, MD  Any Other Special Instructions Will Be Listed Below (If Applicable).   Echocardiogram An echocardiogram is a procedure that uses  painless sound waves (ultrasound) to produce an image of the heart. Images from an echocardiogram can provide important information about:  Signs of coronary artery disease (CAD).  Aneurysm detection. An aneurysm is a weak or damaged part of an artery wall that bulges out from the normal force of blood pumping through the body.  Heart size and shape. Changes in the size or shape of the heart can be associated with certain conditions, including heart failure, aneurysm, and CAD.  Heart muscle function.  Heart valve function.  Signs of a past heart attack.  Fluid buildup around the heart.  Thickening of the heart muscle.  A tumor or infectious growth around the heart valves. Tell a health care provider about:  Any allergies you have.  All medicines you are taking, including vitamins, herbs, eye drops, creams, and over-the-counter medicines.  Any blood disorders you have.  Any surgeries you have had.  Any medical conditions you have.  Whether you are pregnant or may be pregnant. What are the risks? Generally, this is a safe procedure. However, problems may occur, including:  Allergic reaction to dye (contrast) that may be used during the procedure. What happens before the procedure? No specific preparation is needed. You may eat and drink normally. What happens during the procedure?   An IV tube may be inserted into one of your veins.  You may receive contrast through this tube. A contrast is an injection that improves the quality of the pictures from your heart.  A gel will be applied to your chest.  A wand-like tool (transducer) will  be moved over your chest. The gel will help to transmit the sound waves from the transducer.  The sound waves will harmlessly bounce off of your heart to allow the heart images to be captured in real-time motion. The images will be recorded on a computer. The procedure may vary among health care providers and hospitals. What happens after  the procedure?  You may return to your normal, everyday life, including diet, activities, and medicines, unless your health care provider tells you not to do that. Summary  An echocardiogram is a procedure that uses painless sound waves (ultrasound) to produce an image of the heart.  Images from an echocardiogram can provide important information about the size and shape of your heart, heart muscle function, heart valve function, and fluid buildup around your heart.  You do not need to do anything to prepare before this procedure. You may eat and drink normally.  After the echocardiogram is completed, you may return to your normal, everyday life, unless your health care provider tells you not to do that. This information is not intended to replace advice given to you by your health care provider. Make sure you discuss any questions you have with your health care provider. Document Released: 06/06/2000 Document Revised: 09/30/2018 Document Reviewed: 07/12/2016 Elsevier Patient Education  2020 [employer].  '

## 2019-02-25 DIAGNOSIS — I110 Hypertensive heart disease with heart failure: Secondary | ICD-10-CM | POA: Diagnosis not present

## 2019-02-25 DIAGNOSIS — E11621 Type 2 diabetes mellitus with foot ulcer: Secondary | ICD-10-CM | POA: Diagnosis not present

## 2019-02-25 DIAGNOSIS — I482 Chronic atrial fibrillation, unspecified: Secondary | ICD-10-CM | POA: Diagnosis not present

## 2019-02-25 DIAGNOSIS — E1142 Type 2 diabetes mellitus with diabetic polyneuropathy: Secondary | ICD-10-CM | POA: Diagnosis not present

## 2019-02-25 DIAGNOSIS — L97512 Non-pressure chronic ulcer of other part of right foot with fat layer exposed: Secondary | ICD-10-CM | POA: Diagnosis not present

## 2019-02-25 DIAGNOSIS — I5032 Chronic diastolic (congestive) heart failure: Secondary | ICD-10-CM | POA: Diagnosis not present

## 2019-02-25 LAB — BASIC METABOLIC PANEL
BUN/Creatinine Ratio: 10 (ref 10–24)
BUN: 10 mg/dL (ref 8–27)
CO2: 23 mmol/L (ref 20–29)
Calcium: 9.5 mg/dL (ref 8.6–10.2)
Chloride: 101 mmol/L (ref 96–106)
Creatinine, Ser: 0.97 mg/dL (ref 0.76–1.27)
GFR calc Af Amer: 90 mL/min/{1.73_m2} (ref >59)
GFR calc non Af Amer: 78 mL/min/{1.73_m2} (ref >59)
Glucose: 243 mg/dL — ABNORMAL HIGH (ref 65–99)
Potassium: 4 mmol/L (ref 3.5–5.2)
Sodium: 140 mmol/L (ref 134–144)

## 2019-02-25 LAB — DIGITOXIN LEVEL: Digitoxin Lvl: <5 ng/mL — ABNORMAL LOW (ref 10–25)

## 2019-03-02 ENCOUNTER — Ambulatory Visit (INDEPENDENT_AMBULATORY_CARE_PROVIDER_SITE_OTHER): Payer: Medicare Other

## 2019-03-02 DIAGNOSIS — Z7901 Long term (current) use of anticoagulants: Secondary | ICD-10-CM | POA: Diagnosis not present

## 2019-03-02 DIAGNOSIS — I482 Chronic atrial fibrillation, unspecified: Secondary | ICD-10-CM | POA: Diagnosis not present

## 2019-03-07 DIAGNOSIS — L97512 Non-pressure chronic ulcer of other part of right foot with fat layer exposed: Secondary | ICD-10-CM | POA: Diagnosis not present

## 2019-03-07 DIAGNOSIS — E039 Hypothyroidism, unspecified: Secondary | ICD-10-CM | POA: Diagnosis not present

## 2019-03-07 DIAGNOSIS — I5032 Chronic diastolic (congestive) heart failure: Secondary | ICD-10-CM | POA: Diagnosis not present

## 2019-03-07 DIAGNOSIS — Z6841 Body Mass Index (BMI) 40.0 and over, adult: Secondary | ICD-10-CM | POA: Diagnosis not present

## 2019-03-07 DIAGNOSIS — E11621 Type 2 diabetes mellitus with foot ulcer: Secondary | ICD-10-CM | POA: Diagnosis not present

## 2019-03-08 ENCOUNTER — Telehealth: Payer: Self-pay | Admitting: Emergency Medicine

## 2019-03-08 NOTE — Telephone Encounter (Signed)
Attempted to contact patient about labs again. No answer, and voicemail full.

## 2019-03-11 DIAGNOSIS — M4150 Other secondary scoliosis, site unspecified: Secondary | ICD-10-CM

## 2019-03-11 DIAGNOSIS — Z6841 Body Mass Index (BMI) 40.0 and over, adult: Secondary | ICD-10-CM

## 2019-03-11 DIAGNOSIS — I10 Essential (primary) hypertension: Secondary | ICD-10-CM | POA: Diagnosis not present

## 2019-03-11 DIAGNOSIS — M4316 Spondylolisthesis, lumbar region: Secondary | ICD-10-CM | POA: Diagnosis not present

## 2019-03-11 DIAGNOSIS — M545 Low back pain, unspecified: Secondary | ICD-10-CM | POA: Insufficient documentation

## 2019-03-11 HISTORY — DX: Other secondary scoliosis, site unspecified: M41.50

## 2019-03-11 HISTORY — DX: Body Mass Index (BMI) 40.0 and over, adult: Z684

## 2019-03-14 DIAGNOSIS — L97512 Non-pressure chronic ulcer of other part of right foot with fat layer exposed: Secondary | ICD-10-CM | POA: Diagnosis not present

## 2019-03-14 DIAGNOSIS — E039 Hypothyroidism, unspecified: Secondary | ICD-10-CM | POA: Diagnosis not present

## 2019-03-14 DIAGNOSIS — E11621 Type 2 diabetes mellitus with foot ulcer: Secondary | ICD-10-CM | POA: Diagnosis not present

## 2019-03-14 DIAGNOSIS — Z7901 Long term (current) use of anticoagulants: Secondary | ICD-10-CM | POA: Diagnosis not present

## 2019-03-16 DIAGNOSIS — I5032 Chronic diastolic (congestive) heart failure: Secondary | ICD-10-CM | POA: Diagnosis not present

## 2019-03-16 DIAGNOSIS — I482 Chronic atrial fibrillation, unspecified: Secondary | ICD-10-CM | POA: Diagnosis not present

## 2019-03-16 DIAGNOSIS — E1142 Type 2 diabetes mellitus with diabetic polyneuropathy: Secondary | ICD-10-CM | POA: Diagnosis not present

## 2019-03-16 DIAGNOSIS — D508 Other iron deficiency anemias: Secondary | ICD-10-CM | POA: Diagnosis not present

## 2019-03-16 DIAGNOSIS — R0609 Other forms of dyspnea: Secondary | ICD-10-CM | POA: Diagnosis not present

## 2019-03-21 DIAGNOSIS — I482 Chronic atrial fibrillation, unspecified: Secondary | ICD-10-CM | POA: Diagnosis not present

## 2019-03-24 ENCOUNTER — Ambulatory Visit: Payer: Medicare Other | Admitting: Cardiology

## 2019-03-28 ENCOUNTER — Ambulatory Visit (INDEPENDENT_AMBULATORY_CARE_PROVIDER_SITE_OTHER): Payer: Medicare Other

## 2019-03-28 ENCOUNTER — Other Ambulatory Visit: Payer: Self-pay

## 2019-03-28 DIAGNOSIS — I10 Essential (primary) hypertension: Secondary | ICD-10-CM | POA: Diagnosis not present

## 2019-03-28 DIAGNOSIS — I2510 Atherosclerotic heart disease of native coronary artery without angina pectoris: Secondary | ICD-10-CM

## 2019-03-28 MED ORDER — PERFLUTREN LIPID MICROSPHERE: 1.0000 mL | INTRAVENOUS | 0 refills | Status: DC | PRN

## 2019-03-28 NOTE — Progress Notes (Signed)
Complete echocardiogram with contrast has been performed.   Jimmy Jhoselyn Ruffini RDCS, RVT 

## 2019-04-04 DIAGNOSIS — E11621 Type 2 diabetes mellitus with foot ulcer: Secondary | ICD-10-CM | POA: Diagnosis not present

## 2019-04-04 DIAGNOSIS — L89899 Pressure ulcer of other site, unspecified stage: Secondary | ICD-10-CM | POA: Diagnosis not present

## 2019-04-04 DIAGNOSIS — E11628 Type 2 diabetes mellitus with other skin complications: Secondary | ICD-10-CM | POA: Diagnosis not present

## 2019-04-04 DIAGNOSIS — L97512 Non-pressure chronic ulcer of other part of right foot with fat layer exposed: Secondary | ICD-10-CM | POA: Diagnosis not present

## 2019-04-11 DIAGNOSIS — M7918 Myalgia, other site: Secondary | ICD-10-CM | POA: Insufficient documentation

## 2019-04-11 DIAGNOSIS — I10 Essential (primary) hypertension: Secondary | ICD-10-CM | POA: Diagnosis not present

## 2019-04-11 DIAGNOSIS — Z6841 Body Mass Index (BMI) 40.0 and over, adult: Secondary | ICD-10-CM | POA: Diagnosis not present

## 2019-04-11 DIAGNOSIS — M48062 Spinal stenosis, lumbar region with neurogenic claudication: Secondary | ICD-10-CM | POA: Diagnosis not present

## 2019-04-11 DIAGNOSIS — M47816 Spondylosis without myelopathy or radiculopathy, lumbar region: Secondary | ICD-10-CM | POA: Diagnosis not present

## 2019-04-11 DIAGNOSIS — M461 Sacroiliitis, not elsewhere classified: Secondary | ICD-10-CM | POA: Insufficient documentation

## 2019-04-11 HISTORY — DX: Myalgia, other site: M79.18

## 2019-04-11 HISTORY — DX: Sacroiliitis, not elsewhere classified: M46.1

## 2019-04-20 ENCOUNTER — Other Ambulatory Visit: Payer: Self-pay

## 2019-04-20 ENCOUNTER — Encounter: Payer: Self-pay | Admitting: Cardiology

## 2019-04-20 ENCOUNTER — Ambulatory Visit (INDEPENDENT_AMBULATORY_CARE_PROVIDER_SITE_OTHER): Payer: Medicare Other | Admitting: Cardiology

## 2019-04-20 VITALS — BP 120/70 | HR 103 | Ht 71.0 in | Wt 278.2 lb

## 2019-04-20 DIAGNOSIS — I482 Chronic atrial fibrillation, unspecified: Secondary | ICD-10-CM

## 2019-04-20 DIAGNOSIS — C3492 Malignant neoplasm of unspecified part of left bronchus or lung: Secondary | ICD-10-CM | POA: Diagnosis not present

## 2019-04-20 DIAGNOSIS — I10 Essential (primary) hypertension: Secondary | ICD-10-CM | POA: Diagnosis not present

## 2019-04-20 DIAGNOSIS — I2510 Atherosclerotic heart disease of native coronary artery without angina pectoris: Secondary | ICD-10-CM | POA: Diagnosis not present

## 2019-04-20 DIAGNOSIS — Z7901 Long term (current) use of anticoagulants: Secondary | ICD-10-CM | POA: Diagnosis not present

## 2019-04-20 DIAGNOSIS — Z23 Encounter for immunization: Secondary | ICD-10-CM | POA: Diagnosis not present

## 2019-04-20 NOTE — Patient Instructions (Signed)
Medication Instructions:  Your physician recommends that you continue on your current medications as directed. Please refer to the Current Medication list given to you today.  *If you need a refill on your cardiac medications before your next appointment, please call your pharmacy*  Lab Work: None.  If you have labs (blood work) drawn today and your tests are completely normal, you will receive your results only by: Marland Kitchen MyChart Message (if you have MyChart) OR . A paper copy in the mail If you have any lab test that is abnormal or we need to change your treatment, we will call you to review the results.  Testing/Procedures: None.   Follow-Up: At [facility], you and your health needs are our priority.  As part of our continuing mission to provide you with exceptional heart care, we have created designated Provider Care Teams.  These Care Teams include your primary Cardiologist (physician) and Advanced Practice Providers (APPs -  Physician Assistants and Nurse Practitioners) who all work together to provide you with the care you need, when you need it.  Your next appointment:   4 months  The format for your next appointment:   In Person  Provider:   You will see [name], MD.  Or, you can be scheduled with the following Advanced Practice Provider on your designated Care Team (at our [facility]):  Laurann [place], FNP    Other Instructions

## 2019-04-20 NOTE — Progress Notes (Signed)
Cardiology Office Note:    Date:  04/20/2019   ID:  Travis Watts, DOB Apr 10, 1946, MRN [id number]  PCP:  [name], PA-C  Cardiologist:  [name], MD    Referring MD: Cyndy Freeze, MD   Chief Complaint  Patient presents with  . 1 month follow up  Still complaining of being weak and tired  History of Present Illness:    Travis Watts is a 73 y.o. male with coronary artery disease, status post coronary artery bypass graft, permanent atrial fibrillation, recently wore monitor to check heart rate response and actually is fairly controlled slightly elevated heart rate but concern was something completely opposite.  Sometimes he is bradycardic.  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  He is kind of disappointed about it and we spent a great deal of time talking about this and I told him that with his condition still fine to exercise some and do some work on the field but he should not be surprised if he need to have some time to rest after that.  Also described episode of weakness fatigue sometimes when he gets up very quickly.  Blood pressure has been checked during the time as well as heart rate is usually low.  I think we dealing with orthostatic hypotension.  He already stopped taking furosemide.  I also talked to him about potentially seeing urologist to discuss issue of Flomax which may need to be discontinued  Past Medical History:  Diagnosis Date  . A-fib ([place])   . Anxiety   . Arthritis    previous to joint replacement - knees & shoulder.  Pt.'s R foot fused, traumatized from exposure to agent ORANGE  . Cancer ([place])    lung  . CHF (congestive heart failure) ([place])   . Coronary artery disease   . Depression   . Diabetes mellitus without complication ([place])    diagnosed- 65yrs.   Travis Watts Dyspnea   . Dysrhythmia    . GERD (gastroesophageal reflux disease)   . Hyperlipidemia   . Hypertension   . Neuromuscular disorder (HCC)    neuropathy- feet & legs  . Pneumonia    several times, but treated from his home, not hosp.   . Sleep apnea    not able to CPAP, since lung beiing removed- wife reports no problem.   . Stroke (cerebrum) [facility])    as a 73 y.o.     Past Surgical History:  Procedure Laterality Date  . ANKLE FUSION    . CARDIAC CATHETERIZATION    . EYE SURGERY Left    cataract removed   . JOINT REPLACEMENT Left 2016   shoulder replacement   . LUNG LOBECTOMY Left 2016   done at [facility]    . SKIN CANCER EXCISION      Current Medications: Current Meds  Medication Sig  . ARIPiprazole (ABILIFY) 5 MG tablet Take 1 tablet by mouth daily.  Travis Watts atorvastatin (LIPITOR) 80 MG tablet Take 40 mg by mouth daily.   . Cholecalciferol (VITAMIN D) 2000 units tablet Take 2,000 Units by mouth daily.   . clopidogrel (PLAVIX) 75 MG tablet Take 1 tablet by mouth daily.  . cyanocobalamin (,VITAMIN B-12,) 1000 MCG/ML injection Inject 1 mL into the skin every 30 (thirty) days.  . digoxin (  LANOXIN) 0.25 MG tablet Take 0.25 mg by mouth at bedtime.   . DULoxetine (CYMBALTA) 60 MG capsule Take 60 mg by mouth 2 (two) times daily.   . Empagliflozin-linaGLIPtin (GLYXAMBI) 25-5 MG TABS Take 1 tablet by mouth daily.  . fludrocortisone (FLORINEF) 0.1 MG tablet Take 100 mcg by mouth daily.  . furosemide (LASIX) 80 MG tablet Take 80 mg by mouth as needed.   . gabapentin (NEURONTIN) 400 MG capsule Take 400-800 mg by mouth See admin instructions. Take 400 mg by mouth in the morning and take 800 mg by mouth at bedtime  . glipiZIDE (GLUCOTROL) 10 MG tablet Take 10 mg by mouth 2 (two) times daily.  Travis Watts levothyroxine (SYNTHROID) 100 MCG tablet Take 1 tablet by mouth daily.  Travis Watts lisinopril (PRINIVIL,ZESTRIL) 20 MG tablet Take 20 mg by mouth daily.  . magnesium oxide (MAG-OX) 400 MG tablet Take  400 mg by mouth daily.  . metoprolol tartrate (LOPRESSOR) 50 MG tablet Take 25 mg by mouth 2 (two) times daily.  Travis Watts omeprazole (PRILOSEC) 20 MG capsule Take 20 mg by mouth 2 (two) times daily.  Travis Watts PERFLUTREN LIPID MICROSPHERE Inject 1-10 mLs into the vein as needed.  Travis Watts QUEtiapine (SEROQUEL) 100 MG tablet Take 150 mg by mouth at bedtime.   . solifenacin (VESICARE) 5 MG tablet Take 5 mg by mouth daily.   . tamsulosin (FLOMAX) 0.4 MG CAPS capsule Take 0.8 mg by mouth at bedtime.   Travis Watts warfarin (COUMADIN) 5 MG tablet Take 7.5 mg alt with 10 mg     Allergies:   Patient has no known allergies.   Social History   Socioeconomic History  . Marital status: Married    Spouse name: Not on file  . Number of children: Not on file  . Years of education: Not on file  . Highest education level: Not on file  Occupational History  . Not on file  Social Needs  . Financial resource strain: Not on file  . Food insecurity    Worry: Not on file    Inability: Not on file  . Transportation needs    Medical: Not on file    Non-medical: Not on file  Tobacco Use  . Smoking status: Former Smoker    Quit date: 06/18/1994    Years since quitting: 24.8  . Smokeless tobacco: Never Used  Substance and Sexual Activity  . Alcohol use: No  . Drug use: No  . Sexual activity: Not on file  Lifestyle  . Physical activity    Days per week: Not on file    Minutes per session: Not on file  . Stress: Not on file  Relationships  . Social Herbalist on phone: Not on file    Gets together: Not on file    Attends religious service: Not on file    Active member of club or organization: Not on file    Attends meetings of clubs or organizations: Not on file    Relationship status: Not on file  Other Topics Concern  . Not on file  Social History Narrative  . Not on file     Family History: The patient's family history includes Depression in his mother; Early death in his brother, father, maternal  grandmother, and maternal uncle; Heart attack in his brother, father, and paternal uncle; Heart disease in his brother and father; Heart failure in his brother and father; Hyperlipidemia in his brother and father; Hypertension in his brother and father; Osteoarthritis  in his father; Rheum arthritis in his father; Stroke in his maternal grandmother and maternal uncle. ROS:   Please see the history of present illness.    All 14 point review of systems negative except as described per history of present illness  EKGs/Labs/Other Studies Reviewed:      Recent Labs: 02/23/2019: BUN 10; Creatinine, Ser 0.97; Potassium 4.0; Sodium 140  Recent Lipid Panel No results found for: CHOL, TRIG, HDL, CHOLHDL, VLDL, LDLCALC, LDLDIRECT  Physical Exam:    VS:  BP 120/70   Pulse (!) 103   Ht 5\' 11"  (1.803 m)   Wt 278 lb 3.2 oz (126.2 kg)   SpO2 97%   BMI 38.80 kg/m     Wt Readings from Last 3 Encounters:  04/20/19 278 lb 3.2 oz (126.2 kg)  02/23/19 281 lb 6.4 oz (127.6 kg)  04/05/18 (!) 303 lb (137.4 kg)     GEN:  Well nourished, well developed in no acute distress HEENT: Normal NECK: No JVD; No carotid bruits LYMPHATICS: No lymphadenopathy CARDIAC: Irregularly irregular, no murmurs, no rubs, no gallops RESPIRATORY:  Clear to auscultation without rales, wheezing or rhonchi  ABDOMEN: Soft, non-tender, non-distended MUSCULOSKELETAL:  No edema; No deformity  SKIN: Warm and dry LOWER EXTREMITIES: no swelling NEUROLOGIC:  Alert and oriented x 3 PSYCHIATRIC:  Normal affect   ASSESSMENT:    1. Essential hypertension   2. Coronary artery disease involving native coronary artery of native heart without angina pectoris   3. Chronic atrial fibrillation ([place])   4. Small cell carcinoma of left lung (HCC)    PLAN:    In order of problems listed above:  1. Essential hypertension blood pressure well controlled continue present management 2. Coronary disease doing well from that point of view, no  chest pain no tightness no squeezing no pressure no burning chest 3. Permanent atrial fibrillation rate controlled slightly elevated but overall he does have also some episode of bradycardia, therefore, we will continue present management which include anticoagulation 4. Small cell carcinoma of the lung follow-up oncology team.   Medication Adjustments/Labs and Tests Ordered: Current medicines are reviewed at length with the patient today.  Concerns regarding medicines are outlined above.  No orders of the defined types were placed in this encounter.  Medication changes: No orders of the defined types were placed in this encounter.   Signed, Park Liter, MD, [facility] 04/20/2019 9:13 AM    [place]

## 2019-05-05 DIAGNOSIS — M461 Sacroiliitis, not elsewhere classified: Secondary | ICD-10-CM | POA: Diagnosis not present

## 2019-05-05 DIAGNOSIS — M7918 Myalgia, other site: Secondary | ICD-10-CM | POA: Diagnosis not present

## 2019-05-05 DIAGNOSIS — E119 Type 2 diabetes mellitus without complications: Secondary | ICD-10-CM | POA: Diagnosis not present

## 2019-05-05 DIAGNOSIS — M47816 Spondylosis without myelopathy or radiculopathy, lumbar region: Secondary | ICD-10-CM | POA: Diagnosis not present

## 2019-05-05 DIAGNOSIS — I4891 Unspecified atrial fibrillation: Secondary | ICD-10-CM | POA: Diagnosis not present

## 2019-05-26 ENCOUNTER — Other Ambulatory Visit: Payer: Self-pay | Admitting: Cardiology

## 2019-06-02 DIAGNOSIS — L02212 Cutaneous abscess of back [any part, except buttock]: Secondary | ICD-10-CM | POA: Diagnosis not present

## 2019-06-02 DIAGNOSIS — L0390 Cellulitis, unspecified: Secondary | ICD-10-CM | POA: Diagnosis not present

## 2019-06-02 DIAGNOSIS — L720 Epidermal cyst: Secondary | ICD-10-CM | POA: Diagnosis not present

## 2019-08-01 DIAGNOSIS — M25559 Pain in unspecified hip: Secondary | ICD-10-CM

## 2019-08-01 HISTORY — DX: Pain in unspecified hip: M25.559

## 2019-08-20 IMAGING — CR DG LUMBAR SPINE 1V
1 series · 1 of 1 positions shown · non-contrast
Comparison: 05/22/2017 MRI

CLINICAL DATA: L4-5 posterior fixation.

EXAM:
LUMBAR SPINE - 1 VIEW

[lateral]
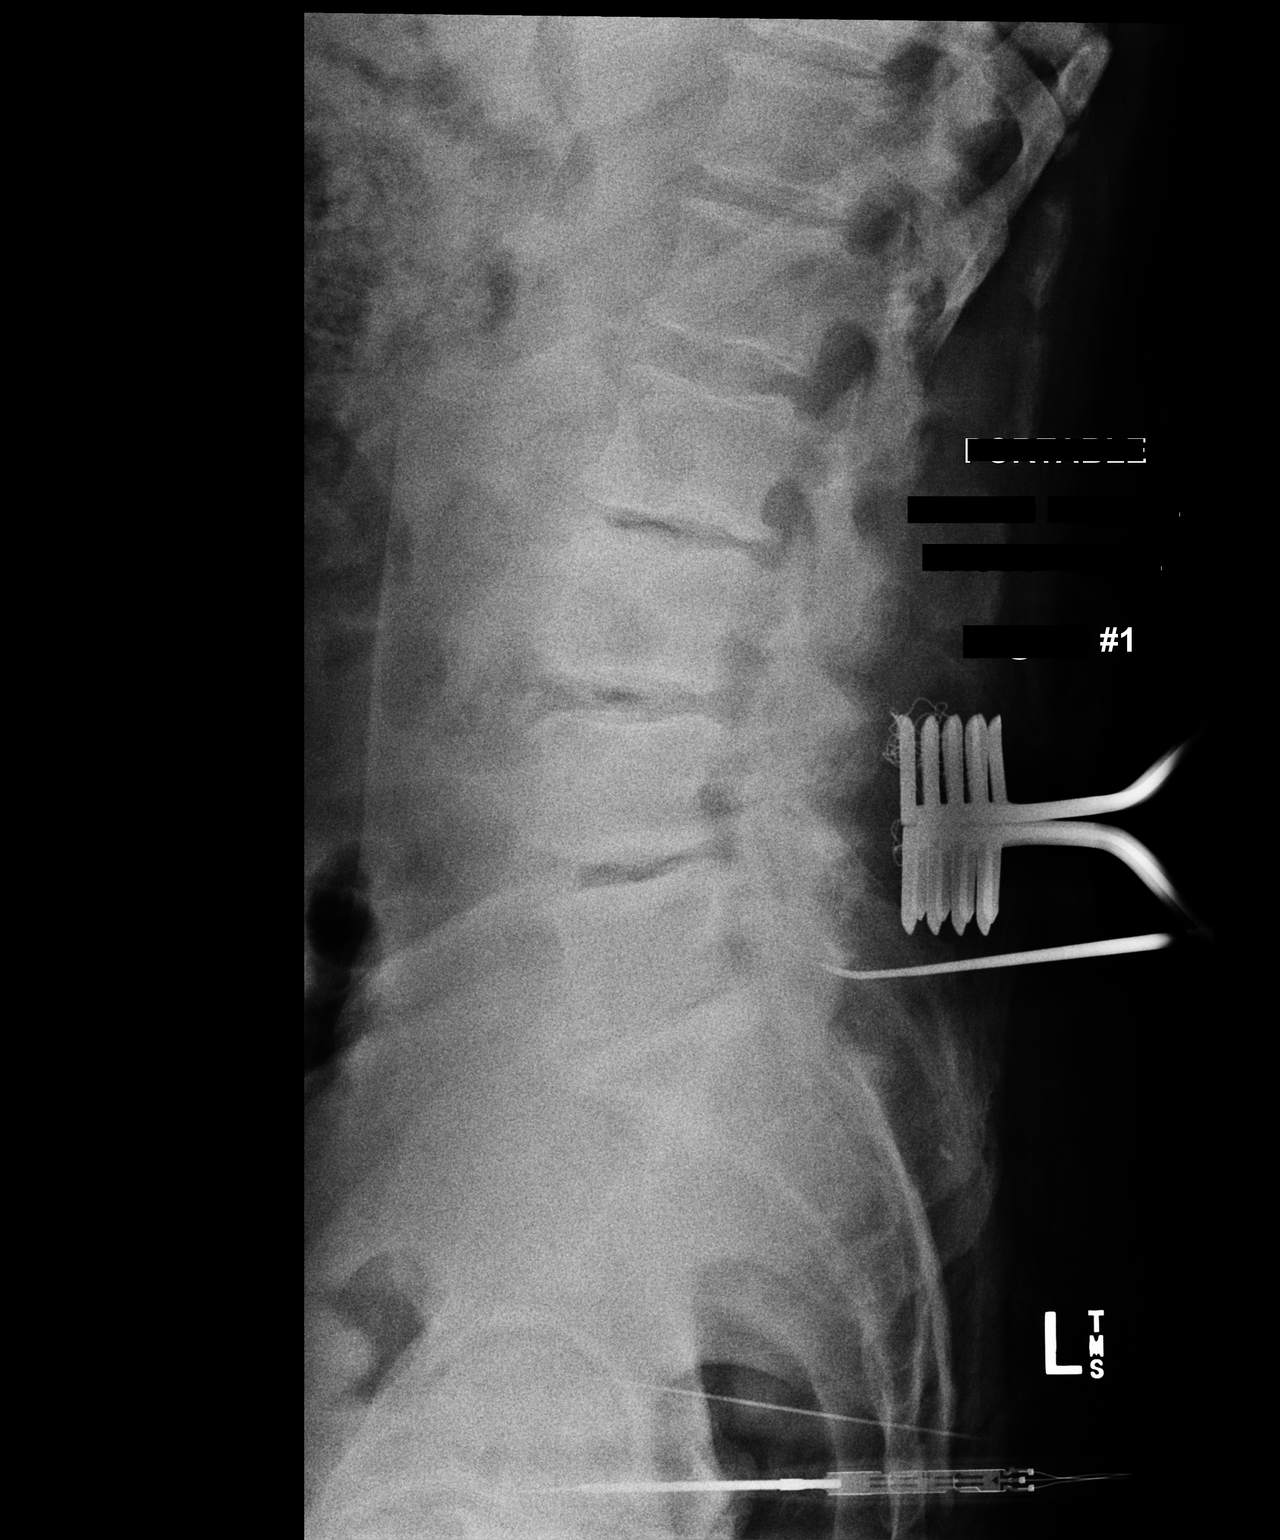

[1 of 1 positions shown; findings below may reference images not displayed]

FINDINGS: Single lateral view labeled 0202 hours. This demonstrates a surgical
pointer projecting posterior to the lumbosacral junction. Soft
tissue expanders project posterior to the L4 and L5 vertebral
bodies. Degenerate disc disease at multiple levels.
IMPRESSION: Intraoperative localization of L4, L5, and the lumbosacral junction.

## 2019-08-20 IMAGING — RF DG C-ARM 61-120 MIN
1 series · 2 of 2 positions shown · non-contrast
Comparison: 06/25/2017.

CLINICAL DATA: Lumbar spine fusion.

EXAM:
DG C-ARM 61-120 MIN

[Series 1: run · 2 of 2 slices shown]
[im 1/2]
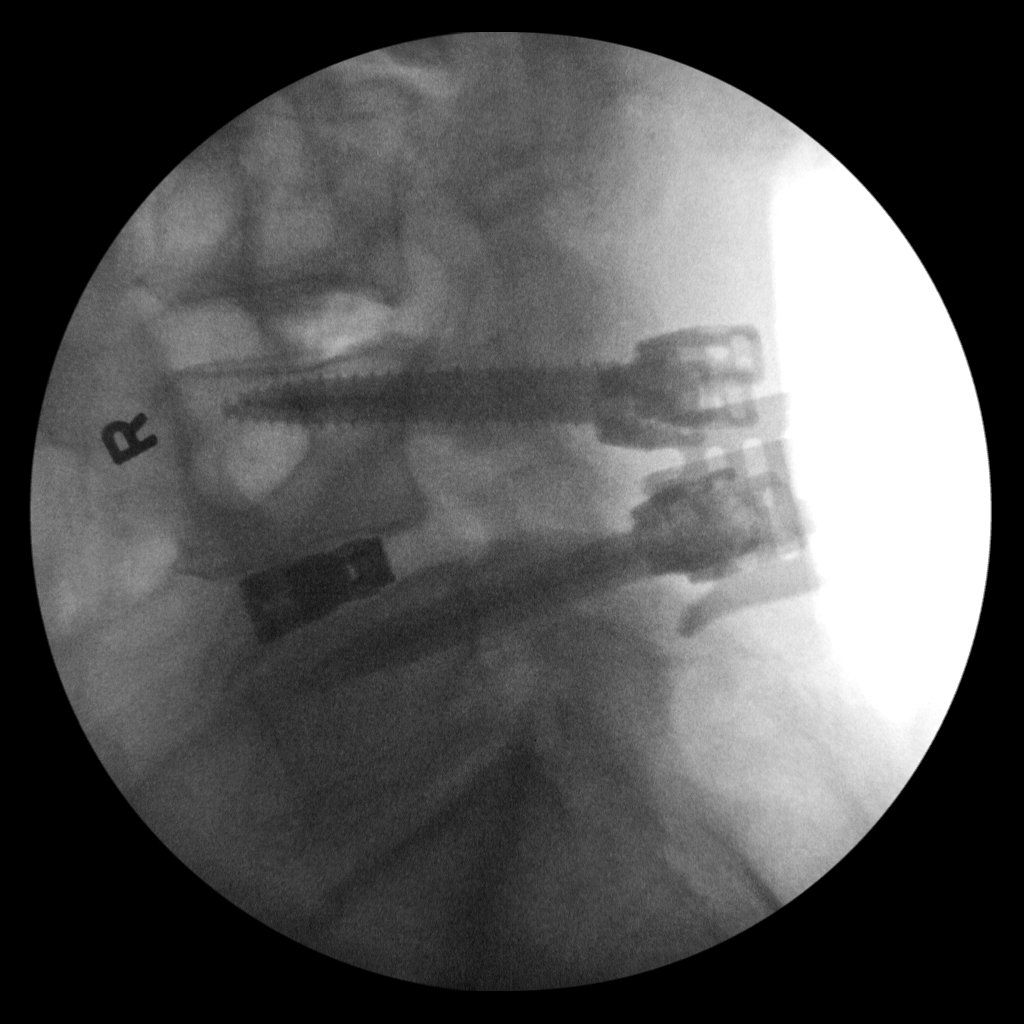
[im 2/2]
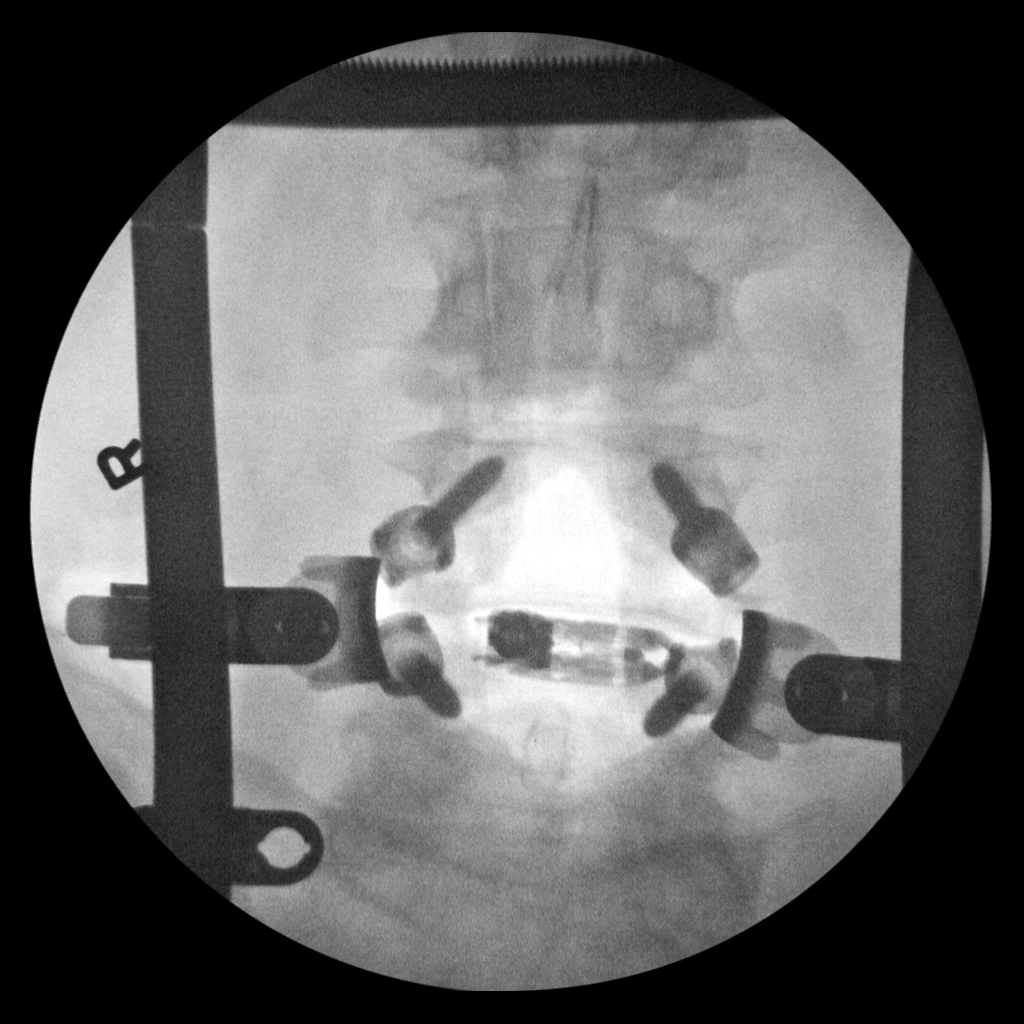

[2 of 2 positions shown; findings below may reference images not displayed]

FINDINGS: Posterior and interbody L4-L5 fusion. Hardware intact. Anatomic
alignment .
IMPRESSION: Posterior and interbody L4-L5 fusion.  Anatomic alignment .

## 2019-09-21 ENCOUNTER — Other Ambulatory Visit: Payer: Self-pay | Admitting: Cardiology

## 2019-12-20 ENCOUNTER — Telehealth: Payer: Self-pay | Admitting: Cardiology

## 2019-12-20 ENCOUNTER — Telehealth: Payer: Self-pay | Admitting: Emergency Medicine

## 2019-12-20 NOTE — Telephone Encounter (Signed)
Left message for [facility] to call me back.

## 2019-12-20 NOTE — Telephone Encounter (Signed)
[facility] returning Travis Watts's call. Transferred call to [facility]

## 2019-12-20 NOTE — Telephone Encounter (Signed)
[facility] called back. Spoke with Reuben Likes she reports that they had the medication mixed up and she is sending a new clearance form. She also informed me that the patient is no longer taking coumadin he is taking eliquis that was prescribed by his pcp.

## 2020-01-05 ENCOUNTER — Telehealth: Payer: Self-pay | Admitting: Emergency Medicine

## 2020-01-05 NOTE — Telephone Encounter (Signed)
   [facility] Pre-operative Risk Assessment    HEARTCARE STAFF: - Please ensure there is not already an duplicate clearance open for this procedure. - Under Visit Info/Reason for Call, type in Other and utilize the format Clearance MM/DD/YY or Clearance TBD. Do not use dashes or single digits. - If request is for dental extraction, please clarify the # of teeth to be extracted.  Request for surgical clearance:  1. What type of surgery is being performed? Spinal injection      2. When is this surgery scheduled? To be determined    3. What type of clearance is required (medical clearance vs. Pharmacy clearance to hold med vs. Both)? Pharmacy  4. Are there any medications that need to be held prior to surgery and how long? plavix and 7 days before    5. Practice name and name of physician performing surgery? [facility]. Marlaine Hind, MD    6. What is the office phone number? [phone]   7.   What is the office fax number? [phone]  8.   Anesthesia type (None, local, MAC, general) ? None specified    Ashok Norris 01/05/2020, 11:27 AM  _________________________________________________________________   (provider comments below)

## 2020-01-05 NOTE — Telephone Encounter (Signed)
@  1235 tried to call pt at number listed it is disconnected [id number] for frances

## 2020-01-05 NOTE — Telephone Encounter (Signed)
   Primary Cardiologist:Robert Agustin Cree, MD  Chart reviewed as part of pre-operative protocol coverage. Because of Travis Watts's past medical history and time since last visit, they will require a follow-up visit in order to better assess preoperative cardiovascular risk.  Pre-op covering staff: - Please schedule appointment and call patient to inform them. If patient already had an upcoming appointment within acceptable timeframe, please add "pre-op clearance" to the appointment notes so provider is aware. - Please contact requesting surgeon's office via preferred method (i.e, phone, fax) to inform them of need for appointment prior to surgery.  [name], PA-C  01/05/2020, 11:42 AM

## 2020-01-10 ENCOUNTER — Other Ambulatory Visit: Payer: Self-pay

## 2020-01-10 ENCOUNTER — Ambulatory Visit (INDEPENDENT_AMBULATORY_CARE_PROVIDER_SITE_OTHER): Payer: Medicare Other | Admitting: Cardiology

## 2020-01-10 ENCOUNTER — Encounter: Payer: Self-pay | Admitting: Cardiology

## 2020-01-10 VITALS — BP 134/82 | HR 90 | Ht 71.0 in | Wt 276.0 lb

## 2020-01-10 DIAGNOSIS — I2510 Atherosclerotic heart disease of native coronary artery without angina pectoris: Secondary | ICD-10-CM

## 2020-01-10 DIAGNOSIS — I10 Essential (primary) hypertension: Secondary | ICD-10-CM

## 2020-01-10 DIAGNOSIS — E7800 Pure hypercholesterolemia, unspecified: Secondary | ICD-10-CM

## 2020-01-10 DIAGNOSIS — I482 Chronic atrial fibrillation, unspecified: Secondary | ICD-10-CM | POA: Diagnosis not present

## 2020-01-10 DIAGNOSIS — I25119 Atherosclerotic heart disease of native coronary artery with unspecified angina pectoris: Secondary | ICD-10-CM | POA: Diagnosis not present

## 2020-01-10 NOTE — Telephone Encounter (Signed)
Pt is scheduled to see Dr. Agustin Cree today, 01/10/20 at 4:00 and clearance will be discussed at the visit. Will route back to the requesting surgeon's office to make them aware and remove from the preop call back pool.

## 2020-01-10 NOTE — Patient Instructions (Signed)
Medication Instructions:  No medication changes. *If you need a refill on your cardiac medications before your next appointment, please call your pharmacy*   Lab Work: None ordered If you have labs (blood work) drawn today and your tests are completely normal, you will receive your results only by: Marland Kitchen MyChart Message (if you have MyChart) OR . A paper copy in the mail If you have any lab test that is abnormal or we need to change your treatment, we will call you to review the results.   Testing/Procedures: None ordered   Follow-Up: At [facility], you and your health needs are our priority.  As part of our continuing mission to provide you with exceptional heart care, we have created designated Provider Care Teams.  These Care Teams include your primary Cardiologist (physician) and Advanced Practice Providers (APPs -  Physician Assistants and Nurse Practitioners) who all work together to provide you with the care you need, when you need it.  We recommend signing up for the patient portal called "MyChart".  Sign up information is provided on this After Visit Summary.  MyChart is used to connect with patients for Virtual Visits (Telemedicine).  Patients are able to view lab/test results, encounter notes, upcoming appointments, etc.  Non-urgent messages can be sent to your provider as well.   To learn more about what you can do with MyChart, go to [email or web address].    Your next appointment:   5 month(s)  The format for your next appointment:   In Person  Provider:   [name], MD   Other Instructions NA

## 2020-01-10 NOTE — Progress Notes (Signed)
Cardiology Office Note:    Date:  01/10/2020   ID:  Travis Watts, DOB 1945-12-15, MRN [id number]  PCP:  [name], PA-C  Cardiologist:  [name], MD    Referring MD: [name], *   No chief complaint on file. I am doing for  History of Present Illness:    Travis Watts is a 74 y.o. male with very complex past medical history that includes coronary artery bypass graft years ago, permanent atrial fibrillation, history of lung resection secondary to lung cancer.  Also history of diabetes.  Comes today to my office for follow-up.  Since have seen him last time he got pneumonia twice however overall seems to be doing well and feeling good.  The biggest problem is the fact that he have a problem with his back.  He did have some injections there and feeling better.  Apparently he is here today because he went make sure it is fine from my point of view to have tooth extraction done and also inquiring about potentially interrupting his anticoagulation and antiplatelet therapy.  I told him that it is not advisable to stop this medication with his condition.  Past Medical History:  Diagnosis Date  . A-fib ([place])   . Anxiety   . Arthritis    previous to joint replacement - knees & shoulder.  Pt.'s R foot fused, traumatized from exposure to agent ORANGE  . Cancer ([place])    lung  . CHF (congestive heart failure) ([place])   . Coronary artery disease   . Depression   . Diabetes mellitus without complication ([place])    diagnosed- 61yrs.   Marland Kitchen Dyspnea   . Dysrhythmia   . GERD (gastroesophageal reflux disease)   . Hyperlipidemia   . Hypertension   . Neuromuscular disorder ([facility])    neuropathy- feet & legs  . Pneumonia    several times, but treated from his home, not hosp.   . Sleep apnea    not able to CPAP, since lung beiing removed- wife reports no problem.   . Stroke (cerebrum) [facility])    as a 74 y.o.     Past Surgical History:  Procedure Laterality Date  .  ANKLE FUSION    . CARDIAC CATHETERIZATION    . EYE SURGERY Left    cataract removed   . JOINT REPLACEMENT Left 2016   shoulder replacement   . LUNG LOBECTOMY Left 2016   done at [facility]    . SKIN CANCER EXCISION      Current Medications: Current Meds  Medication Sig  . ARIPiprazole (ABILIFY) 5 MG tablet Take 1 tablet by mouth daily.  Marland Kitchen atorvastatin (LIPITOR) 80 MG tablet Take 40 mg by mouth daily.   . Cholecalciferol (VITAMIN D) 2000 units tablet Take 2,000 Units by mouth daily.   . clopidogrel (PLAVIX) 75 MG tablet TAKE ONE TABLET BY MOUTH ONCE DAILY WITH BREAKFAST  . cyanocobalamin (,VITAMIN B-12,) 1000 MCG/ML injection Inject 1 mL into the skin every 30 (thirty) days.  . digoxin (LANOXIN) 0.25 MG tablet Take 0.25 mg by mouth at bedtime.   . DULoxetine (CYMBALTA) 60 MG capsule Take 60 mg by mouth 2 (two) times daily.   . Empagliflozin-linaGLIPtin (GLYXAMBI) 25-5 MG TABS Take 1 tablet by mouth daily.  . fludrocortisone (FLORINEF) 0.1 MG tablet Take 100 mcg by mouth daily.  . furosemide (LASIX) 80 MG tablet Take 80 mg by mouth as needed.   Marland Kitchen  gabapentin (NEURONTIN) 400 MG capsule Take 400-800 mg by mouth See admin instructions. Take 400 mg by mouth in the morning and take 800 mg by mouth at bedtime  . glipiZIDE (GLUCOTROL) 10 MG tablet Take 10 mg by mouth 2 (two) times daily.  Marland Kitchen levothyroxine (SYNTHROID) 100 MCG tablet Take 1 tablet by mouth daily.  Marland Kitchen lisinopril (PRINIVIL,ZESTRIL) 20 MG tablet Take 20 mg by mouth daily.  . magnesium oxide (MAG-OX) 400 MG tablet Take 400 mg by mouth daily.  . metoprolol tartrate (LOPRESSOR) 50 MG tablet Take 25 mg by mouth 2 (two) times daily.  Marland Kitchen omeprazole (PRILOSEC) 20 MG capsule Take 20 mg by mouth 2 (two) times daily.  Marland Kitchen PERFLUTREN LIPID MICROSPHERE Inject 1-10 mLs into the vein as needed.  Marland Kitchen QUEtiapine (SEROQUEL) 100 MG tablet Take 150 mg by mouth at bedtime.   . solifenacin (VESICARE) 5 MG tablet Take 5  mg by mouth daily.   . tamsulosin (FLOMAX) 0.4 MG CAPS capsule Take 0.8 mg by mouth at bedtime.   Marland Kitchen warfarin (COUMADIN) 5 MG tablet Take 7.5 mg alt with 10 mg     Allergies:   Patient has no known allergies.   Social History   Socioeconomic History  . Marital status: Married    Spouse name: Not on file  . Number of children: Not on file  . Years of education: Not on file  . Highest education level: Not on file  Occupational History  . Not on file  Tobacco Use  . Smoking status: Former Smoker    Quit date: 06/18/1994    Years since quitting: 25.5  . Smokeless tobacco: Never Used  Vaping Use  . Vaping Use: Never used  Substance and Sexual Activity  . Alcohol use: No  . Drug use: No  . Sexual activity: Not on file  Other Topics Concern  . Not on file  Social History Narrative  . Not on file   Social Determinants of Health   Financial Resource Strain:   . Difficulty of Paying Living Expenses:   Food Insecurity:   . Worried About Charity fundraiser in the Last Year:   . Arboriculturist in the Last Year:   Transportation Needs:   . Film/video editor (Medical):   Marland Kitchen Lack of Transportation (Non-Medical):   Physical Activity:   . Days of Exercise per Week:   . Minutes of Exercise per Session:   Stress:   . Feeling of Stress :   Social Connections:   . Frequency of Communication with Friends and Family:   . Frequency of Social Gatherings with Friends and Family:   . Attends Religious Services:   . Active Member of Clubs or Organizations:   . Attends Archivist Meetings:   Marland Kitchen Marital Status:      Family History: The patient's family history includes Depression in his mother; Early death in his brother, father, maternal grandmother, and maternal uncle; Heart attack in his brother, father, and paternal uncle; Heart disease in his brother and father; Heart failure in his brother and father; Hyperlipidemia in his brother and father; Hypertension in his brother  and father; Osteoarthritis in his father; Rheum arthritis in his father; Stroke in his maternal grandmother and maternal uncle. ROS:   Please see the history of present illness.    All 14 point review of systems negative except as described per history of present illness  EKGs/Labs/Other Studies Reviewed:      Recent Labs:  02/23/2019: BUN 10; Creatinine, Ser 0.97; Potassium 4.0; Sodium 140  Recent Lipid Panel No results found for: CHOL, TRIG, HDL, CHOLHDL, VLDL, LDLCALC, LDLDIRECT  Physical Exam:    VS:  BP 134/82 (BP Location: Right Arm, Patient Position: Sitting, Cuff Size: Normal)   Pulse 90   Ht 5\' 11"  (1.803 m)   Wt 276 lb (125.2 kg)   SpO2 94%   BMI 38.49 kg/m     Wt Readings from Last 3 Encounters:  01/10/20 276 lb (125.2 kg)  04/20/19 278 lb 3.2 oz (126.2 kg)  02/23/19 281 lb 6.4 oz (127.6 kg)     GEN:  Well nourished, well developed in no acute distress HEENT: Normal NECK: No JVD; No carotid bruits LYMPHATICS: No lymphadenopathy CARDIAC: RRR, no murmurs, no rubs, no gallops RESPIRATORY:  Clear to auscultation without rales, wheezing or rhonchi  ABDOMEN: Soft, non-tender, non-distended MUSCULOSKELETAL:  No edema; No deformity  SKIN: Warm and dry LOWER EXTREMITIES: no swelling NEUROLOGIC:  Alert and oriented x 3 PSYCHIATRIC:  Normal affect   ASSESSMENT:    1. Chronic atrial fibrillation ([place])   2. Atherosclerosis of native coronary artery of native heart with angina pectoris ([place])   3. Essential hypertension   4. Coronary artery disease involving native coronary artery of native heart without angina pectoris   5. Hypercholesterolemia    PLAN:    In order of problems listed above:  1. Chronic atrial fibrillation/permanent atrial fibrillation: Rate controlled, he is anticoagulated with Coumadin which I will continue. 2. Coronary artery disease he is on antiplatelets therapy in form of Plavix tolerating well no evidence of bleeding will  continue. 3. Essential hypertension blood pressure well controlled continue present management. 4. Hypercholesterolemia: I do did review his K PN which showed me LDL of 108 with HDL 52 this is from September 01, 2017.  I do not have in the recent data.  Will contact his primary care physician to get it.  He is on Lipitor 80 which I will continue for now.   Medication Adjustments/Labs and Tests Ordered: Current medicines are reviewed at length with the patient today.  Concerns regarding medicines are outlined above.  Orders Placed This Encounter  Procedures  . EKG 12-Lead   Medication changes: No orders of the defined types were placed in this encounter.   Signed, Park Liter, MD, [facility] 01/10/2020 4:53 PM    [place]

## 2020-01-10 NOTE — Telephone Encounter (Signed)
Pt's # on file is disconnected.  Have left another message for pt's contact, [name].  Will route back to the requesting surgeon's office to make them aware that we cannot reach pt.

## 2020-01-12 ENCOUNTER — Telehealth: Payer: Self-pay | Admitting: Cardiology

## 2020-01-12 NOTE — Telephone Encounter (Addendum)
Patient with diagnosis of afib on warfarin for anticoagulation.    Procedure: spinal injection Date of procedure: TBD  CHADS2-VASc score of 7 (age, CHF, HTN, DM, CAD, stroke)  CrCl 70mL/min using adjusted body weight Platelet count 250K  Per office protocol, patient can hold warfarin for 5 days prior to procedure, however he will require bridging with Lovenox due to elevated cardiac risk. Warfarin is being managed by [facility] who should be contacted to coordinate his Lovenox bridge.

## 2020-01-12 NOTE — Telephone Encounter (Signed)
It is very interesting, I spoke to him yesterday and he said he did have those injections without interruption of anticoagulation.  If it is truly spinal injection that really will need to be held off with Coumadin for at least 5  days.  In need to be bridged with Lovenox.

## 2020-01-12 NOTE — Telephone Encounter (Signed)
Dr Agustin Cree please review- the patient apparently needs clearance for a spinal injection, not tooth extraction. I'll forward to pharmacy as well but is he cleared from a cardiac standpoint.  Kerin Ransom PA-C 01/12/2020 11:12 AM

## 2020-01-12 NOTE — Telephone Encounter (Signed)
Patient's wife, [name], called in to ask that the clearance be faxed to [facility]. Marlaine Hind, MD at [phone]. [name] stated that as soon as the clearance is faxed over, then the office will call the patient in to have the spine injection. Please advise.

## 2020-01-13 NOTE — Telephone Encounter (Signed)
   Primary Cardiologist: [name], MD  Chart reviewed as part of pre-operative protocol coverage. Given past medical history and time since last visit, based on ACC/AHA guidelines, Travis Watts would be at acceptable risk for the planned procedure without further cardiovascular testing.   Patient with diagnosis of afib on warfarin for anticoagulation.    Procedure: spinal injection Date of procedure: TBD  CHADS2-VASc score of 7 (age, CHF, HTN, DM, CAD, stroke)  CrCl 33mL/min using adjusted body weight Platelet count 250K  Per office protocol, patient can hold warfarin for 5 days prior to procedure, however he will require bridging with Lovenox due to elevated cardiac risk. Warfarin is being managed by [facility] who should be contacted to coordinate his Lovenox bridge.  I will route this recommendation to the requesting party via Epic fax function and remove from pre-op pool.  Please call with questions.  Jossie Ng. Kayleanna Lorman NP-C    01/13/2020, 7:45 AM [place] [address] Office [phone] Fax [phone]

## 2020-01-18 NOTE — Telephone Encounter (Signed)
Patient's wife, [name] is calling to follow up on the clearance. She states that [place] at [facility], Dr. Biagio Quint office stated they have not received the clearance, and need it in order to move forward. [place] that it was faxed back to them thru Epic, she states that they did not get anything. Please re-faxthe clearance to the attention of [place] to [phone].

## 2020-01-19 NOTE — Telephone Encounter (Addendum)
Resent/faxed on 01/18/2020

## 2020-05-16 ENCOUNTER — Other Ambulatory Visit: Payer: Self-pay

## 2020-05-16 DIAGNOSIS — I4891 Unspecified atrial fibrillation: Secondary | ICD-10-CM | POA: Insufficient documentation

## 2020-05-16 DIAGNOSIS — I10 Essential (primary) hypertension: Secondary | ICD-10-CM | POA: Insufficient documentation

## 2020-05-16 DIAGNOSIS — G709 Myoneural disorder, unspecified: Secondary | ICD-10-CM | POA: Insufficient documentation

## 2020-05-16 DIAGNOSIS — I509 Heart failure, unspecified: Secondary | ICD-10-CM | POA: Insufficient documentation

## 2020-05-16 DIAGNOSIS — F32A Depression, unspecified: Secondary | ICD-10-CM | POA: Insufficient documentation

## 2020-05-16 DIAGNOSIS — I499 Cardiac arrhythmia, unspecified: Secondary | ICD-10-CM | POA: Insufficient documentation

## 2020-05-16 DIAGNOSIS — G4730 Sleep apnea, unspecified: Secondary | ICD-10-CM | POA: Insufficient documentation

## 2020-05-16 DIAGNOSIS — I639 Cerebral infarction, unspecified: Secondary | ICD-10-CM | POA: Insufficient documentation

## 2020-05-16 DIAGNOSIS — E785 Hyperlipidemia, unspecified: Secondary | ICD-10-CM | POA: Insufficient documentation

## 2020-05-16 DIAGNOSIS — M1990 Unspecified osteoarthritis, unspecified site: Secondary | ICD-10-CM | POA: Insufficient documentation

## 2020-05-16 DIAGNOSIS — C801 Malignant (primary) neoplasm, unspecified: Secondary | ICD-10-CM | POA: Insufficient documentation

## 2020-05-16 DIAGNOSIS — J189 Pneumonia, unspecified organism: Secondary | ICD-10-CM | POA: Insufficient documentation

## 2020-05-22 ENCOUNTER — Encounter: Payer: Self-pay | Admitting: Cardiology

## 2020-05-22 ENCOUNTER — Ambulatory Visit (INDEPENDENT_AMBULATORY_CARE_PROVIDER_SITE_OTHER): Payer: Medicare Other | Admitting: Cardiology

## 2020-05-22 ENCOUNTER — Other Ambulatory Visit: Payer: Self-pay

## 2020-05-22 VITALS — BP 100/64 | HR 68 | Ht 72.0 in | Wt 270.0 lb

## 2020-05-22 DIAGNOSIS — E1142 Type 2 diabetes mellitus with diabetic polyneuropathy: Secondary | ICD-10-CM | POA: Diagnosis not present

## 2020-05-22 DIAGNOSIS — I2510 Atherosclerotic heart disease of native coronary artery without angina pectoris: Secondary | ICD-10-CM

## 2020-05-22 DIAGNOSIS — C801 Malignant (primary) neoplasm, unspecified: Secondary | ICD-10-CM

## 2020-05-22 DIAGNOSIS — Z9889 Other specified postprocedural states: Secondary | ICD-10-CM

## 2020-05-22 DIAGNOSIS — G20 Parkinson's disease: Secondary | ICD-10-CM

## 2020-05-22 DIAGNOSIS — I10 Essential (primary) hypertension: Secondary | ICD-10-CM | POA: Diagnosis not present

## 2020-05-22 DIAGNOSIS — Z902 Acquired absence of lung [part of]: Secondary | ICD-10-CM

## 2020-05-22 DIAGNOSIS — I482 Chronic atrial fibrillation, unspecified: Secondary | ICD-10-CM

## 2020-05-22 HISTORY — DX: Parkinson's disease: G20

## 2020-05-22 NOTE — Patient Instructions (Signed)

## 2020-05-22 NOTE — Progress Notes (Signed)
Cardiology Office Note:    Date:  05/22/2020   ID:  Travis Watts, DOB 1945/08/13, MRN [id number]  PCP:  Travis Beckwith, PA-C  Cardiologist:  Travis Campus, MD    Referring MD: Travis Watts, *   Chief Complaint  Patient presents with  . Shortness of Breath  . Follow-up    History of Present Illness:    Travis Watts is a 74 y.o. male with past medical history significant for coronary artery disease status post coronary bypass graft many years ago, permanent atrial fibrillation, history of lung resection secondary to lung cancer, diabetes, essential hypertension, dyslipidemia. Comes today 2 months of follow-up recently he did suffer from COVID-19 infection, he was vaccinated and he is course was overall only mild. He seems to be recovering quite nicely. He does have a new diagnosis of Parkinson's. Signs and symptoms of this is dementia as well as hypomobility. Denies have any cardiac complaints there is no chest pain tightness squeezing pressure burning chest. He states at home in the chair and watch TV all the time. Described to have some exertional shortness of breath.  Past Medical History:  Diagnosis Date  . A-fib ([place])   . Abnormal nuclear stress test 06/12/2016   Formatting of this note might be different from the original. Added automatically from request for surgery [id number]  . Acquired hypothyroidism 06/05/2016   Last Assessment & Plan:  Formatting of this note might be different from the original. [place] does labs and they will send copies never  . Anxiety   . Arthritis    previous to joint replacement - knees & shoulder.  Pt.'s R foot fused, traumatized from exposure to agent ORANGE  . Atherosclerosis of native coronary artery of native heart with angina pectoris ([place]) 06/12/2016   Overview:  Added automatically from request for surgery [id number]  . Cancer ([place])    lung  . CHF (congestive heart failure) ([place])   . Chronic anticoagulation 07/27/2018    Last Assessment & Plan:  Formatting of this note might be different from the original. Is on the eliquis and no concerns GFR > 60  . Chronic atrial fibrillation ([place]) 04/25/2015   Overview:  Chads score of 4, anticoagulated with warfarin  Formatting of this note might be different from the original. Advised to go to 2 daily and recheck 10 days  Last Assessment & Plan:  Formatting of this note might be different from the original. On the Eliquis now and denies adverse issues Formatting of this note might be different from the original. Overview:  Chads score of 4, anticoagul  . Chronic diastolic congestive heart failure ([place]) 03/05/2018   Last Assessment & Plan:  Formatting of this note might be different from the original. Sees cardio and is UTD and feels well overall  . Class 2 severe obesity due to excess calories with serious comorbidity in adult [facility]) 03/05/2018   Last Assessment & Plan:  Formatting of this note might be different from the original. Is down 7 pounds  ( on purpose ) and is working on it  . Coronary artery disease   . Coronary artery disease involving native coronary artery of native heart 09/07/2015   Overview:  Left main coronary artery stenting known in March 2017 Cardiac catheterization December 2017 showed patent stent  . Coronary atherosclerosis of native coronary artery 09/07/2015   Formatting of this note might be different from the original. Overview:  Left main coronary artery stenting known in March  2017 Cardiac catheterization December 2017 showed patent stent  Overview:  Added automatically from request for surgery [id number] Formatting of this note might be different from the original. Left main coronary artery stenting known in March 2017 Cardiac catheterization December  . Dementia without behavioral disturbance ([place]) 11/30/2018   Formatting of this note might be different from the original. Could be sequlae of the vesicare and try to stop it Also, mouth is very dry  F/U sxs p/w   Last Assessment & Plan:  Formatting of this note might be different from the original. He is doing about as expected Some loss and likel a component of Pd as well and polypharmacy already an issue  [facility] is his primary  . Depression   . Diabetes mellitus without complication ([place])    diagnosed- 27yrs.   . Dizziness 10/19/2017  . Dyspnea   . Dyspnea on exertion 07/27/2018   Last Assessment & Plan:  Formatting of this note might be different from the original. Follwing cardio Seen and had tests and has F/U  . Dysrhythmia   . Episode of recurrent major depressive disorder ([place]) 11/30/2018   Formatting of this note might be different from the original. Doing better with the Abilify Per wife  Will continue on and 6 weeks f/u  Last Assessment & Plan:  Formatting of this note might be different from the original. No changes and on SNRI and atypical  . Essential hypertension 04/25/2015   Last Assessment & Plan:  Formatting of this note might be different from the original. Perfect and has had some dose reduction  . Ex-smoker 08/20/2015  . GERD (gastroesophageal reflux disease)   . History of pneumonectomy 03/17/2018  . Hypercholesterolemia 06/05/2016  . Hyperlipidemia   . Hypertension   . Mixed hyperlipidemia 04/25/2015  . Neuromuscular disorder (HCC)    neuropathy- feet & legs  . OSA (obstructive sleep apnea) 07/11/2016  . Parkinson disease ([place]) 6 weeks ago  . Pneumonia    several times, but treated from his home, not hosp.   . Pre-operative clearance 06/17/2017  . Primary malignant neoplasm of left lung ([place]) 01/13/2017  . Rectal cancer ([place]) 03/17/2017  . Shortness of breath 10/07/2017  . Sleep apnea    not able to CPAP, since lung beiing removed- wife reports no problem.   . Small cell carcinoma of left lung ([place]) 04/25/2015  . Spondylolisthesis of lumbar region 06/25/2017  . Stroke (cerebrum) [facility])    as a 38 y.o.   Marland Kitchen Type 2 diabetes mellitus with diabetic polyneuropathy, without long-term current  use of insulin ([place]) 03/05/2018   diagnosed- 63yrs.  Last Assessment & Plan:  Formatting of this note might be different from the original. Excellent control Would check every 6 months--3 months ago was 7  . Type 2 diabetes mellitus without complication ([place]) 25/08/6642    Past Surgical History:  Procedure Laterality Date  . ANKLE FUSION    . CARDIAC CATHETERIZATION    . EYE SURGERY Left    cataract removed   . JOINT REPLACEMENT Left 2016   shoulder replacement   . LUNG LOBECTOMY Left 2016   done at [facility]    . SKIN CANCER EXCISION      Current Medications: Current Meds  Medication Sig  . albuterol (PROVENTIL) (2.5 MG/3ML) 0.083% nebulizer solution Inhale 2.5 mg into the lungs as needed for wheezing.  . ARIPiprazole (ABILIFY) 5 MG tablet Take 1 tablet by mouth daily.  Marland Kitchen  atorvastatin (LIPITOR) 80 MG tablet Take 40 mg by mouth daily.   . Cholecalciferol (VITAMIN D) 2000 units tablet Take 2,000 Units by mouth daily.   . clopidogrel (PLAVIX) 75 MG tablet TAKE ONE TABLET BY MOUTH ONCE DAILY WITH BREAKFAST  . cyanocobalamin (,VITAMIN B-12,) 1000 MCG/ML injection Inject 1 mL into the skin every 30 (thirty) days.  . digoxin (LANOXIN) 0.25 MG tablet Take 0.25 mg by mouth at bedtime.   . DULoxetine (CYMBALTA) 60 MG capsule Take 60 mg by mouth 2 (two) times daily.   . Empagliflozin-linaGLIPtin (GLYXAMBI) 25-5 MG TABS Take 1 tablet by mouth daily.  . ferrous sulfate 325 (65 FE) MG EC tablet Take 325 mg by mouth every other day.  . fludrocortisone (FLORINEF) 0.1 MG tablet Take 100 mcg by mouth daily.  . furosemide (LASIX) 80 MG tablet Take 80 mg by mouth as needed.   . gabapentin (NEURONTIN) 400 MG capsule Take 400-800 mg by mouth See admin instructions. Take 400 mg by mouth in the morning and take 800 mg by mouth at bedtime  . glipiZIDE (GLUCOTROL) 10 MG tablet Take 10 mg by mouth 2 (two) times daily.  Marland Kitchen levothyroxine (SYNTHROID) 100 MCG tablet Take 1  tablet by mouth daily.  Marland Kitchen lisinopril (PRINIVIL,ZESTRIL) 20 MG tablet Take 20 mg by mouth daily.  . magnesium oxide (MAG-OX) 400 MG tablet Take 400 mg by mouth daily.  . metoprolol tartrate (LOPRESSOR) 50 MG tablet Take 25 mg by mouth 2 (two) times daily.  . Multiple Vitamin (MULTIVITAMIN) tablet Take 1 tablet by mouth daily.  Marland Kitchen nystatin cream (MYCOSTATIN) Apply 100,000 g topically 2 (two) times daily as needed.  Marland Kitchen omeprazole (PRILOSEC) 20 MG capsule Take 20 mg by mouth 2 (two) times daily.  . QUEtiapine (SEROQUEL) 100 MG tablet Take 150 mg by mouth at bedtime.   . solifenacin (VESICARE) 5 MG tablet Take 5 mg by mouth daily.   . tamsulosin (FLOMAX) 0.4 MG CAPS capsule Take 0.4 mg by mouth at bedtime.  . triamcinolone (KENALOG) 0.1 % Apply 1 application topically as needed.  . vitamin C (ASCORBIC ACID) 250 MG tablet Take 500 mg by mouth daily.  Marland Kitchen zinc gluconate 50 MG tablet Take 50 mg by mouth daily.     Allergies:   Patient has no known allergies.   Social History   Socioeconomic History  . Marital status: Married    Spouse name: Not on file  . Number of children: Not on file  . Years of education: Not on file  . Highest education level: Not on file  Occupational History  . Not on file  Tobacco Use  . Smoking status: Former Smoker    Quit date: 06/18/1994    Years since quitting: 25.9  . Smokeless tobacco: Never Used  Vaping Use  . Vaping Use: Never used  Substance and Sexual Activity  . Alcohol use: No  . Drug use: No  . Sexual activity: Not on file  Other Topics Concern  . Not on file  Social History Narrative  . Not on file   Social Determinants of Health   Financial Resource Strain:   . Difficulty of Paying Living Expenses: Not on file  Food Insecurity:   . Worried About Charity fundraiser in the Last Year: Not on file  . Ran Out of Food in the Last Year: Not on file  Transportation Needs:   . Lack of Transportation (Medical): Not on file  . Lack of  Transportation (Non-Medical):  Not on file  Physical Activity:   . Days of Exercise per Week: Not on file  . Minutes of Exercise per Session: Not on file  Stress:   . Feeling of Stress : Not on file  Social Connections:   . Frequency of Communication with Friends and Family: Not on file  . Frequency of Social Gatherings with Friends and Family: Not on file  . Attends Religious Services: Not on file  . Active Member of Clubs or Organizations: Not on file  . Attends Archivist Meetings: Not on file  . Marital Status: Not on file     Family History: The patient's family history includes Depression in his mother; Early death in his brother, father, maternal grandmother, and maternal uncle; Heart attack in his brother, father, and paternal uncle; Heart disease in his brother and father; Heart failure in his brother and father; Hyperlipidemia in his brother and father; Hypertension in his brother and father; Osteoarthritis in his father; Rheum arthritis in his father; Stroke in his maternal grandmother and maternal uncle. ROS:   Please see the history of present illness.    All 14 point review of systems negative except as described per history of present illness  EKGs/Labs/Other Studies Reviewed:      Recent Labs: No results found for requested labs within last 8760 hours.  Recent Lipid Panel No results found for: CHOL, TRIG, HDL, CHOLHDL, VLDL, LDLCALC, LDLDIRECT  Physical Exam:    VS:  BP 100/64 (BP Location: Left Arm, Patient Position: Sitting)   Pulse 68   Ht 6' (1.829 m)   Wt 270 lb (122.5 kg)   PF 95 L/min   BMI 36.62 kg/m     Wt Readings from Last 3 Encounters:  05/22/20 270 lb (122.5 kg)  01/10/20 276 lb (125.2 kg)  04/20/19 278 lb 3.2 oz (126.2 kg)     GEN:  Well nourished, well developed in no acute distress HEENT: Normal NECK: No JVD; No carotid bruits LYMPHATICS: No lymphadenopathy CARDIAC: Irregularly irregular, no murmurs, no rubs, no  gallops RESPIRATORY:  Clear to auscultation without rales, wheezing or rhonchi  ABDOMEN: Soft, non-tender, non-distended MUSCULOSKELETAL:  No edema; No deformity  SKIN: Warm and dry LOWER EXTREMITIES: no swelling NEUROLOGIC:  Alert and oriented x 3 PSYCHIATRIC:  Normal affect   ASSESSMENT:    1. Coronary artery disease involving native coronary artery of native heart without angina pectoris   2. Chronic atrial fibrillation (HCC)   3. Essential hypertension   4. Type 2 diabetes mellitus with diabetic polyneuropathy, without long-term current use of insulin ([place])   5. Cancer ([place])   6. History of pneumonectomy   7. Parkinson's disease ([place])    PLAN:    In order of problems listed above:  1. Coronary disease stable from that point review on appropriate medications which I will continue. 2. Permanent atrial fibrillation rate control his anticoagulant which I will continue. He is rate control is accomplished with digoxin as well as beta-blocker which I will continue. 3. Type 2 diabetes followed by antimedicine team, I did review K PN which showed A1c of 7.6% need to be slightly better controlled. 4. New problem recently diagnosed Parkinson's disease he is scheduled to see neurology for it. 5. History of pneumonectomy secondary to lung cancer. Doing well from that point review.   Medication Adjustments/Labs and Tests Ordered: Current medicines are reviewed at length with the patient today.  Concerns regarding medicines are outlined above.  No orders of  the defined types were placed in this encounter.  Medication changes: No orders of the defined types were placed in this encounter.   Signed, Park Liter, MD, [facility] 05/22/2020 10:43 AM    [address]

## 2020-06-07 ENCOUNTER — Ambulatory Visit: Payer: Medicare Other | Admitting: Cardiology

## 2020-07-02 ENCOUNTER — Other Ambulatory Visit: Payer: Self-pay | Admitting: Cardiology

## 2020-07-02 NOTE — Telephone Encounter (Signed)
Rx refill sent to pharmacy. 

## 2020-07-18 DIAGNOSIS — R413 Other amnesia: Secondary | ICD-10-CM

## 2020-07-18 HISTORY — DX: Other amnesia: R41.3

## 2020-07-23 DIAGNOSIS — I361 Nonrheumatic tricuspid (valve) insufficiency: Secondary | ICD-10-CM

## 2020-07-23 DIAGNOSIS — I340 Nonrheumatic mitral (valve) insufficiency: Secondary | ICD-10-CM

## 2020-07-23 DIAGNOSIS — R0602 Shortness of breath: Secondary | ICD-10-CM | POA: Diagnosis not present

## 2020-07-26 ENCOUNTER — Other Ambulatory Visit: Payer: Self-pay

## 2020-07-26 DIAGNOSIS — G20 Parkinson's disease: Secondary | ICD-10-CM | POA: Insufficient documentation

## 2020-07-26 DIAGNOSIS — I2510 Atherosclerotic heart disease of native coronary artery without angina pectoris: Secondary | ICD-10-CM | POA: Insufficient documentation

## 2020-07-26 DIAGNOSIS — E119 Type 2 diabetes mellitus without complications: Secondary | ICD-10-CM | POA: Insufficient documentation

## 2020-07-26 DIAGNOSIS — R0600 Dyspnea, unspecified: Secondary | ICD-10-CM | POA: Insufficient documentation

## 2020-08-07 ENCOUNTER — Ambulatory Visit (INDEPENDENT_AMBULATORY_CARE_PROVIDER_SITE_OTHER): Payer: Medicare Other | Admitting: Cardiology

## 2020-08-07 ENCOUNTER — Telehealth: Payer: Self-pay

## 2020-08-07 ENCOUNTER — Encounter: Payer: Self-pay | Admitting: Cardiology

## 2020-08-07 ENCOUNTER — Other Ambulatory Visit: Payer: Self-pay

## 2020-08-07 VITALS — BP 152/98 | HR 77 | Ht 72.0 in | Wt 280.0 lb

## 2020-08-07 DIAGNOSIS — I5032 Chronic diastolic (congestive) heart failure: Secondary | ICD-10-CM | POA: Diagnosis not present

## 2020-08-07 DIAGNOSIS — G4733 Obstructive sleep apnea (adult) (pediatric): Secondary | ICD-10-CM | POA: Diagnosis not present

## 2020-08-07 DIAGNOSIS — G20 Parkinson's disease: Secondary | ICD-10-CM

## 2020-08-07 DIAGNOSIS — I2510 Atherosclerotic heart disease of native coronary artery without angina pectoris: Secondary | ICD-10-CM

## 2020-08-07 DIAGNOSIS — I482 Chronic atrial fibrillation, unspecified: Secondary | ICD-10-CM | POA: Diagnosis not present

## 2020-08-07 NOTE — Telephone Encounter (Signed)
Pt seeing Dr. Agustin Cree today. Will route note to him to address surgical clearance at today's visit. Richardson Dopp, PA-C    08/07/2020 3:51 PM

## 2020-08-07 NOTE — Progress Notes (Signed)
Cardiology Office Note:    Date:  08/07/2020   ID:  Travis Watts, DOB 05-Jun-1946, MRN [id number]  PCP:  [name], PA-C  Cardiologist:  [name], MD    Referring MD: [name], *   Chief Complaint  Patient presents with  . Shortness of Breath  . Dizziness  . Hypertension    History of Present Illness:    Travis Watts is a 75 y.o. male with complex past medical history which is significant for coronary artery disease, status post coronary artery bypass graft many years ago, permanent atrial fibrillation, history of left lung resection secondary to cancer, diabetes, essential hypertension, dyslipidemia, recently diagnosed with Parkinson.  He is scheduled for a right shoulder surgery and she would like to be evaluated for this from cardiac standpoint of view.  Couple weeks ago he got situation when he went for presurgical screening when he was there he became sweaty diaphoretic and up going to the emergency room, he ruled out for myocardial infarction, echocardiogram has been repeated which showed left ventricle hypertrophy but overall normal left ventricle ejection fraction he was discharged home.  Overall he tells me he feels lousy but he has been saying this for years.  He is tired fatigue short of breath.  His wife tells me that he sleeps about 1516 hrs. every single day he sleeps until 12:00 done during the day he falls asleep very easily.  Again new diagnosis diagnosis of Parkinson what triggered this investigation was his bradykinesia's as well as memory issues.  He had very poor short-term memory.  He denies have any chest pain tightness squeezing pressure burning chest, leading complaints fatigue tiredness shortness of breath.  We talked about doing shoulder surgery because of pain as well as the fact that he cannot lift his arm above his head.  Past Medical History:  Diagnosis Date  . A-fib ([place])   . Abnormal nuclear stress test 06/12/2016    Formatting of this note might be different from the original. Added automatically from request for surgery [id number]  . Acquired hypothyroidism 06/05/2016   Last Assessment & Plan:  Formatting of this note might be different from the original. [place] does labs and they will send copies never  . Anxiety   . Arthritis    previous to joint replacement - knees & shoulder.  Pt.'s R foot fused, traumatized from exposure to agent ORANGE  . Atherosclerosis of native coronary artery of native heart with angina pectoris ([place]) 06/12/2016   Overview:  Added automatically from request for surgery [id number]  . Cancer ([place])    lung  . CHF (congestive heart failure) ([place])   . Chronic anticoagulation 07/27/2018   Last Assessment & Plan:  Formatting of this note might be different from the original. Is on the eliquis and no concerns GFR > 60  . Chronic atrial fibrillation ([place]) 04/25/2015   Overview:  Chads score of 4, anticoagulated with warfarin  Formatting of this note might be different from the original. Advised to go to 2 daily and recheck 10 days  Last Assessment & Plan:  Formatting of this note might be different from the original. On the Eliquis now and denies adverse issues Formatting of this note might be different from the original. Overview:  Chads score of 4, anticoagul  . Chronic diastolic congestive heart failure ([place]) 03/05/2018   Last Assessment & Plan:  Formatting of this note might be different from the original. Sees cardio and is UTD  and feels well overall  . Class 2 severe obesity due to excess calories with serious comorbidity in adult [facility]) 03/05/2018   Last Assessment & Plan:  Formatting of this note might be different from the original. Is down 7 pounds  ( on purpose ) and is working on it  . Coronary artery disease   . Coronary artery disease involving native coronary artery of native heart 09/07/2015   Overview:  Left main coronary artery stenting known in March 2017 Cardiac catheterization  December 2017 showed patent stent  . Coronary atherosclerosis of native coronary artery 09/07/2015   Formatting of this note might be different from the original. Overview:  Left main coronary artery stenting known in March 2017 Cardiac catheterization December 2017 showed patent stent  Overview:  Added automatically from request for surgery [id number] Formatting of this note might be different from the original. Left main coronary artery stenting known in March 2017 Cardiac catheterization December  . Dementia without behavioral disturbance ([place]) 11/30/2018   Formatting of this note might be different from the original. Could be sequlae of the vesicare and try to stop it Also, mouth is very dry  F/U sxs p/w  Last Assessment & Plan:  Formatting of this note might be different from the original. He is doing about as expected Some loss and likel a component of Pd as well and polypharmacy already an issue  [facility] is his [facility]  . Depression   . Diabetes mellitus without complication ([place])    diagnosed- 30yrs.   . Dizziness 10/19/2017  . Dyspnea   . Dyspnea on exertion 07/27/2018   Last Assessment & Plan:  Formatting of this note might be different from the original. Follwing cardio Seen and had tests and has F/U  . Dysrhythmia   . Episode of recurrent major depressive disorder ([place]) 11/30/2018   Formatting of this note might be different from the original. Doing better with the Abilify Per wife  Will continue on and 6 weeks f/u  Last Assessment & Plan:  Formatting of this note might be different from the original. No changes and on SNRI and atypical  . Essential hypertension 04/25/2015   Last Assessment & Plan:  Formatting of this note might be different from the original. Perfect and has had some dose reduction  . Ex-smoker 08/20/2015  . GERD (gastroesophageal reflux disease)   . History of pneumonectomy 03/17/2018  . Hypercholesterolemia 06/05/2016  . Hyperlipidemia   . Hypertension   . Memory difficulty  07/18/2020  . Mixed hyperlipidemia 04/25/2015  . Neuromuscular disorder (HCC)    neuropathy- feet & legs  . OSA (obstructive sleep apnea) 07/11/2016  . Parkinson disease ([place]) 6 weeks ago  . Parkinson's disease ([place]) 05/22/2020  . Pneumonia    several times, but treated from his home, not hosp.   . Pre-operative clearance 06/17/2017  . Primary malignant neoplasm of left lung ([place]) 01/13/2017  . Rectal cancer ([place]) 03/17/2017  . Shortness of breath 10/07/2017  . Sleep apnea    not able to CPAP, since lung beiing removed- wife reports no problem.   . Small cell carcinoma of left lung ([place]) 04/25/2015  . Spondylolisthesis of lumbar region 06/25/2017  . Stroke (cerebrum) [facility])    as a 75 y.o.   Marland Kitchen Type 2 diabetes mellitus with diabetic polyneuropathy, without long-term current use of insulin ([place]) 03/05/2018   diagnosed- 64yrs.  Last Assessment & Plan:  Formatting of this note might be different from the original. Excellent control  Would check every 6 months--3 months ago was 7  . Type 2 diabetes mellitus without complication ([place]) 65/02/9356    Past Surgical History:  Procedure Laterality Date  . ANKLE FUSION    . CARDIAC CATHETERIZATION    . EYE SURGERY Left    cataract removed   . JOINT REPLACEMENT Left 2016   shoulder replacement   . LUNG LOBECTOMY Left 2016   done at [facility]    . SKIN CANCER EXCISION      Current Medications: Current Meds  Medication Sig  . albuterol (PROVENTIL) (2.5 MG/3ML) 0.083% nebulizer solution Inhale 2.5 mg into the lungs as needed for wheezing.  . Alogliptin Benzoate 25 MG TABS Take 1 tablet by mouth daily. In the morning'  . apixaban (ELIQUIS) 5 MG TABS tablet Take 5 mg by mouth 2 (two) times daily.  . ARIPiprazole (ABILIFY) 5 MG tablet Take 1 tablet by mouth daily.  Marland Kitchen ascorbic acid (VITAMIN C) 500 MG tablet Take 500 mg by mouth daily.  Marland Kitchen atorvastatin (LIPITOR) 80 MG tablet Take 40 mg by mouth daily.   .  Cholecalciferol (VITAMIN D) 2000 units tablet Take 4,000 Units by mouth daily.  . clopidogrel (PLAVIX) 75 MG tablet TAKE ONE TABLET BY MOUTH ONCE DAILY WITH BREAKFAST  . cyanocobalamin (,VITAMIN B-12,) 1000 MCG/ML injection Inject 1 mL into the skin every 30 (thirty) days.  . digoxin (LANOXIN) 0.25 MG tablet Take 0.25 mg by mouth at bedtime.   . DULoxetine (CYMBALTA) 60 MG capsule Take 120 mg by mouth daily after breakfast.  . empagliflozin (JARDIANCE) 25 MG TABS tablet Take 0.5 tablets by mouth daily.  . ferrous sulfate 325 (65 FE) MG EC tablet Take 325 mg by mouth every other day.  . fludrocortisone (FLORINEF) 0.1 MG tablet Take 100 mcg by mouth daily.  . furosemide (LASIX) 80 MG tablet Take 80 mg by mouth as needed.   . gabapentin (NEURONTIN) 400 MG capsule Take 400-800 mg by mouth See admin instructions. Take 400 mg by mouth in the morning and take 800 mg by mouth at bedtime  . glipiZIDE (GLUCOTROL) 10 MG tablet Take 10 mg by mouth 2 (two) times daily.  Marland Kitchen levothyroxine (SYNTHROID) 100 MCG tablet Take 1 tablet by mouth daily.  Marland Kitchen lisinopril (PRINIVIL,ZESTRIL) 20 MG tablet Take 20 mg by mouth daily.  . magnesium oxide (MAG-OX) 400 MG tablet Take 420 mg by mouth daily.  . metoprolol tartrate (LOPRESSOR) 50 MG tablet Take 25 mg by mouth 2 (two) times daily.  . Multiple Vitamin (MULTIVITAMIN ADULT PO) Take 1 tablet by mouth daily.  Marland Kitchen omeprazole (PRILOSEC OTC) 20 MG tablet Take 20 mg by mouth at bedtime.  Marland Kitchen QUEtiapine (SEROQUEL) 100 MG tablet Take 150 mg by mouth at bedtime.   . solifenacin (VESICARE) 5 MG tablet Take 5 mg by mouth daily.   . tamsulosin (FLOMAX) 0.4 MG CAPS capsule Take 0.4 mg by mouth at bedtime.  . vitamin C (ASCORBIC ACID) 250 MG tablet Take 500 mg by mouth daily.  Marland Kitchen zinc gluconate 50 MG tablet Take 1 tablet by mouth at bedtime.  . [DISCONTINUED] ARIPiprazole (ABILIFY) 10 MG tablet Take 0.5 tablets by mouth daily.     Allergies:   Patient has no known allergies.   Social  History   Socioeconomic History  . Marital status: Married    Spouse name: Not on file  . Number of children: Not on file  . Years of education:  Not on file  . Highest education level: Not on file  Occupational History  . Not on file  Tobacco Use  . Smoking status: Former Smoker    Quit date: 06/18/1994    Years since quitting: 26.1  . Smokeless tobacco: Never Used  Vaping Use  . Vaping Use: Never used  Substance and Sexual Activity  . Alcohol use: No  . Drug use: No  . Sexual activity: Not on file  Other Topics Concern  . Not on file  Social History Narrative  . Not on file   Social Determinants of Health   Financial Resource Strain: Not on file  Food Insecurity: Not on file  Transportation Needs: Not on file  Physical Activity: Not on file  Stress: Not on file  Social Connections: Not on file     Family History: The patient's family history includes Depression in his mother; Early death in his brother, father, maternal grandmother, and maternal uncle; Heart attack in his brother, father, and paternal uncle; Heart disease in his brother and father; Heart failure in his brother and father; Hyperlipidemia in his brother and father; Hypertension in his brother and father; Osteoarthritis in his father; Rheum arthritis in his father; Stroke in his maternal grandmother and maternal uncle. ROS:   Please see the history of present illness.    All 14 point review of systems negative except as described per history of present illness  EKGs/Labs/Other Studies Reviewed:      Recent Labs: No results found for requested labs within last 8760 hours.  Recent Lipid Panel No results found for: CHOL, TRIG, HDL, CHOLHDL, VLDL, LDLCALC, LDLDIRECT  Physical Exam:    VS:  BP (!) 152/98 (BP Location: Right Arm, Patient Position: Sitting)   Pulse 77   Ht 6' (1.829 m)   Wt 280 lb (127 kg)   SpO2 94%   BMI 37.97 kg/m     Wt Readings from Last 3 Encounters:  08/07/20 280 lb (127  kg)  05/22/20 270 lb (122.5 kg)  01/10/20 276 lb (125.2 kg)     GEN:  Well nourished, well developed in no acute distress HEENT: Normal NECK: No JVD; No carotid bruits LYMPHATICS: No lymphadenopathy CARDIAC: Irregularly irregular, no murmurs, no rubs, no gallops RESPIRATORY:  Clear to auscultation without rales, wheezing or rhonchi, poor air entry left side ABDOMEN: Soft, non-tender, non-distended MUSCULOSKELETAL:  No edema; No deformity  SKIN: Warm and dry LOWER EXTREMITIES: no swelling NEUROLOGIC:  Alert and oriented x 3 PSYCHIATRIC:  Normal affect   ASSESSMENT:    1. Coronary artery disease involving native coronary artery of native heart without angina pectoris   2. Chronic atrial fibrillation (HCC)   3. Chronic diastolic congestive heart failure ([place])   4. OSA (obstructive sleep apnea)   5. Parkinson's disease ([place])    PLAN:    In order of problems listed above:  1. Coronary artery disease is difficult to assess his exercise status based on the history.  He does not do much I think we must rule out coronary artery disease activation.  He will be scheduled to have [place]. 2. Permanent atrial fibrillation, rate is controlled he is anticoagulated which I will continue. 3. Chronic diastolic congestive heart failure seems to be compensated on physical exam today. 4. Obstructive sleep apnea he does use CPAP mask on the regular basis. 5. Parkinson disease, new discovery.  Followed by neurology. 6. Cardiovascular preop evaluation.  He will be scheduled to have Lexiscan overall however I  think he is relatively poor candidate for the surgery because of multiple comorbidities namely pulmonary resection before however he did have left shoulder surgery a while ago and he did tolerate this quite well.   Medication Adjustments/Labs and Tests Ordered: Current medicines are reviewed at length with the patient today.  Concerns regarding medicines are outlined above.  Orders Placed This  Encounter  Procedures  . MYOCARDIAL PERFUSION IMAGING   Medication changes: No orders of the defined types were placed in this encounter.   Signed, Park Liter, MD, [facility] 08/07/2020 4:10 PM    [place]

## 2020-08-07 NOTE — Patient Instructions (Signed)
Medication Instructions:  Your physician recommends that you continue on your current medications as directed. Please refer to the Current Medication list given to you today.  *If you need a refill on your cardiac medications before your next appointment, please call your pharmacy*   Lab Work: None If you have labs (blood work) drawn today and your tests are completely normal, you will receive your results only by: Marland Kitchen MyChart Message (if you have MyChart) OR . A paper copy in the mail If you have any lab test that is abnormal or we need to change your treatment, we will call you to review the results.   Testing/Procedures:   [facility] [address] [place], [place] [address] Phone:  [phone]    Please arrive 15 minutes prior to your appointment time for registration and insurance purposes.  The test will take approximately 3 to 4 hours to complete; you may bring reading material.  If someone comes with you to your appointment, they will need to remain in the main lobby due to limited space in the testing area. **If you are pregnant or breastfeeding, please notify the nuclear lab prior to your appointment**  How to prepare for your Myocardial Perfusion Test: . Do not eat or drink 3 hours prior to your test, except you may have water. . Do not consume products containing caffeine (regular or decaffeinated) 12 hours prior to your test. (ex: coffee, chocolate, sodas, tea). . Do bring a list of your current medications with you.  If not listed below, you may take your medications as normal.  . Do wear comfortable clothes (no dresses or overalls) and walking shoes, tennis shoes preferred (No heels or open toe shoes are allowed). . Do NOT wear cologne, perfume, aftershave, or lotions (deodorant is allowed). . If these instructions are not followed, your test will have to be rescheduled.  Please report to [address] for your test.  If you have  questions or concerns about your appointment, you can call the [facility] at [phone].  If you cannot keep your appointment, please provide 24 hours notification to the Nuclear Lab, to avoid a possible $50 charge to your account.    Follow-Up: At [facility], you and your health needs are our priority.  As part of our continuing mission to provide you with exceptional heart care, we have created designated Provider Care Teams.  These Care Teams include your primary Cardiologist (physician) and Advanced Practice Providers (APPs -  Physician Assistants and Nurse Practitioners) who all work together to provide you with the care you need, when you need it.  We recommend signing up for the patient portal called "MyChart".  Sign up information is provided on this After Visit Summary.  MyChart is used to connect with patients for Virtual Visits (Telemedicine).  Patients are able to view lab/test results, encounter notes, upcoming appointments, etc.  Non-urgent messages can be sent to your provider as well.   To learn more about what you can do with MyChart, go to [email or web address].    Your next appointment:   5 month(s)  The format for your next appointment:   In Person  Provider:   [name], MD   Other Instructions   Cardiac Nuclear Scan A cardiac nuclear scan is a test that measures blood flow to the heart when a person is resting and when he or she is exercising. The test looks for problems such as:  Not enough blood reaching a portion of the heart.  The heart muscle not working normally. You may need this test if:  You have heart disease.  You have had abnormal lab results.  You have had heart surgery or a balloon procedure to open up blocked arteries (angioplasty).  You have chest pain.  You have shortness of breath. In this test, a radioactive dye (tracer) is injected into your bloodstream. After the tracer has traveled to  your heart, an imaging device is used to measure how much of the tracer is absorbed by or distributed to various areas of your heart. This procedure is usually done at a hospital and takes 2-4 hours. Tell a health care provider about:  Any allergies you have.  All medicines you are taking, including vitamins, herbs, eye drops, creams, and over-the-counter medicines.  Any problems you or family members have had with anesthetic medicines.  Any blood disorders you have.  Any surgeries you have had.  Any medical conditions you have.  Whether you are pregnant or may be pregnant. What are the risks? Generally, this is a safe procedure. However, problems may occur, including:  Serious chest pain and heart attack. This is only a risk if the stress portion of the test is done.  Rapid heartbeat.  Sensation of warmth in your chest. This usually passes quickly.  Allergic reaction to the tracer. What happens before the procedure?  Ask your health care provider about changing or stopping your regular medicines. This is especially important if you are taking diabetes medicines or blood thinners.  Follow instructions from your health care provider about eating or drinking restrictions.  Remove your jewelry on the day of the procedure. What happens during the procedure?  An IV will be inserted into one of your veins.  Your health care provider will inject a small amount of radioactive tracer through the IV.  You will wait for 20-40 minutes while the tracer travels through your bloodstream.  Your heart activity will be monitored with an electrocardiogram (ECG).  You will lie down on an exam table.  Images of your heart will be taken for about 15-20 minutes.  You may also have a stress test. For this test, one of the following may be done: ? You will exercise on a treadmill or stationary bike. While you exercise, your heart's activity will be monitored with an ECG, and your blood  pressure will be checked. ? You will be given medicines that will increase blood flow to parts of your heart. This is done if you are unable to exercise.  When blood flow to your heart has peaked, a tracer will again be injected through the IV.  After 20-40 minutes, you will get back on the exam table and have more images taken of your heart.  Depending on the type of tracer used, scans may need to be repeated 3-4 hours later.  Your IV line will be removed when the procedure is over. The procedure may vary among health care providers and hospitals. What happens after the procedure?  Unless your health care provider tells you otherwise, you may return to your normal schedule, including diet, activities, and medicines.  Unless your health care provider tells you otherwise, you may increase your fluid intake. This will help to flush the contrast dye from your body. Drink enough fluid to keep your urine pale yellow.  Ask your health care provider, or the department that is doing the test: ? When will my results  be ready? ? How will I get my results? Summary  A cardiac nuclear scan measures the blood flow to the heart when a person is resting and when he or she is exercising.  Tell your health care provider if you are pregnant.  Before the procedure, ask your health care provider about changing or stopping your regular medicines. This is especially important if you are taking diabetes medicines or blood thinners.  After the procedure, unless your health care provider tells you otherwise, increase your fluid intake. This will help flush the contrast dye from your body.  After the procedure, unless your health care provider tells you otherwise, you may return to your normal schedule, including diet, activities, and medicines. This information is not intended to replace advice given to you by your health care provider. Make sure you discuss any questions you have with your health care  provider. Document Revised: 11/23/2017 Document Reviewed: 11/23/2017 Elsevier Patient Education  [place].

## 2020-08-07 NOTE — Telephone Encounter (Signed)
   [facility] Pre-operative Risk Assessment    Request for surgical clearance:  1. What type of surgery is being performed? Right Total Shoulder Replacement   2. When is this surgery scheduled? TBD   3. What type of clearance is required (medical clearance vs. Pharmacy clearance to hold med vs. Both)? Both  4. Are there any medications that need to be held prior to surgery and how long? Eliqius- time not specified   5. Practice name and name of physician performing surgery?   Dr. Emmaline Kluver at [facility].  6. What is your office phone number: [phone]    7.   What is your office fax number: [phone]  8.   Anesthesia type (None, local, MAC, general) ? General Anesthesia  FYI: Patient is schedule today with Dr. [name] [name] 08/07/2020, 10:51 AM  _________________________________________________________________   (provider comments below)

## 2020-08-09 ENCOUNTER — Telehealth (HOSPITAL_COMMUNITY): Payer: Self-pay

## 2020-08-09 NOTE — Addendum Note (Signed)
Addended by: Senaida Ores on: 08/09/2020 11:26 AM   Modules accepted: Orders

## 2020-08-09 NOTE — Telephone Encounter (Signed)
Detailed instructions left on the patient's answering machine. Asked to call back with any questions. S.Kamron Vanwyhe EMTP 

## 2020-08-09 NOTE — Addendum Note (Signed)
Addended by: [name] on: 08/09/2020 12:46 PM   Modules accepted: Orders

## 2020-08-09 NOTE — Telephone Encounter (Signed)
Is scheduled to have a stress test, awaiting for results of it

## 2020-08-15 ENCOUNTER — Other Ambulatory Visit: Payer: Self-pay

## 2020-08-15 ENCOUNTER — Ambulatory Visit: Payer: Medicare Other

## 2020-08-15 ENCOUNTER — Ambulatory Visit (INDEPENDENT_AMBULATORY_CARE_PROVIDER_SITE_OTHER): Payer: Medicare Other

## 2020-08-15 DIAGNOSIS — I2510 Atherosclerotic heart disease of native coronary artery without angina pectoris: Secondary | ICD-10-CM

## 2020-08-15 MED ORDER — REGADENOSON 0.4 MG/5ML IV SOLN
0.4000 mg | Freq: Once | INTRAVENOUS | Status: AC
  Administered 2020-08-15: 0.4 mg via INTRAVENOUS

## 2020-08-15 MED ORDER — TECHNETIUM TC 99M TETROFOSMIN IV KIT
31.7000 | PACK | Freq: Once | INTRAVENOUS | Status: AC | PRN
  Administered 2020-08-15: 31.7 via INTRAVENOUS

## 2020-08-16 ENCOUNTER — Ambulatory Visit: Payer: Medicare Other

## 2020-08-16 LAB — MYOCARDIAL PERFUSION IMAGING
LV dias vol: 83 mL (ref 62–150)
LV sys vol: 28 mL
Peak HR: 101 {beats}/min
Rest HR: 88 {beats}/min
SDS: 6
SRS: 6
SSS: 12
TID: 0.79

## 2020-08-16 MED ORDER — TECHNETIUM TC 99M TETROFOSMIN IV KIT
32.8000 | PACK | Freq: Once | INTRAVENOUS | Status: AC | PRN
  Administered 2020-08-16: 32.8 via INTRAVENOUS

## 2020-08-22 DIAGNOSIS — R350 Frequency of micturition: Secondary | ICD-10-CM | POA: Insufficient documentation

## 2020-08-22 DIAGNOSIS — N401 Enlarged prostate with lower urinary tract symptoms: Secondary | ICD-10-CM

## 2020-08-22 HISTORY — DX: Frequency of micturition: R35.0

## 2020-08-22 HISTORY — DX: Benign prostatic hyperplasia with lower urinary tract symptoms: N40.1

## 2020-08-23 ENCOUNTER — Ambulatory Visit (INDEPENDENT_AMBULATORY_CARE_PROVIDER_SITE_OTHER): Payer: Medicare Other | Admitting: Cardiology

## 2020-08-23 ENCOUNTER — Other Ambulatory Visit: Payer: Self-pay

## 2020-08-23 ENCOUNTER — Encounter: Payer: Self-pay | Admitting: Cardiology

## 2020-08-23 VITALS — BP 138/78 | HR 101 | Ht 72.0 in | Wt 277.0 lb

## 2020-08-23 DIAGNOSIS — G20 Parkinson's disease: Secondary | ICD-10-CM

## 2020-08-23 DIAGNOSIS — I10 Essential (primary) hypertension: Secondary | ICD-10-CM | POA: Diagnosis not present

## 2020-08-23 DIAGNOSIS — I482 Chronic atrial fibrillation, unspecified: Secondary | ICD-10-CM

## 2020-08-23 DIAGNOSIS — R0600 Dyspnea, unspecified: Secondary | ICD-10-CM

## 2020-08-23 DIAGNOSIS — I2510 Atherosclerotic heart disease of native coronary artery without angina pectoris: Secondary | ICD-10-CM | POA: Diagnosis not present

## 2020-08-23 DIAGNOSIS — E782 Mixed hyperlipidemia: Secondary | ICD-10-CM

## 2020-08-23 DIAGNOSIS — R0609 Other forms of dyspnea: Secondary | ICD-10-CM

## 2020-08-23 MED ORDER — ISOSORBIDE MONONITRATE ER 30 MG PO TB24: 30.0000 mg | ORAL_TABLET | Freq: Every day | ORAL | 1 refills | Status: DC

## 2020-08-23 NOTE — Patient Instructions (Signed)
Medication Instructions:  Your physician has recommended you make the following change in your medication:   START: imdur 30 mg daily   *If you need a refill on your cardiac medications before your next appointment, please call your pharmacy*   Lab Work: None If you have labs (blood work) drawn today and your tests are completely normal, you will receive your results only by: Marland Kitchen MyChart Message (if you have MyChart) OR . A paper copy in the mail If you have any lab test that is abnormal or we need to change your treatment, we will call you to review the results.   Testing/Procedures: None   Follow-Up: At [facility], you and your health needs are our priority.  As part of our continuing mission to provide you with exceptional heart care, we have created designated Provider Care Teams.  These Care Teams include your primary Cardiologist (physician) and Advanced Practice Providers (APPs -  Physician Assistants and Nurse Practitioners) who all work together to provide you with the care you need, when you need it.  We recommend signing up for the patient portal called "MyChart".  Sign up information is provided on this After Visit Summary.  MyChart is used to connect with patients for Virtual Visits (Telemedicine).  Patients are able to view lab/test results, encounter notes, upcoming appointments, etc.  Non-urgent messages can be sent to your provider as well.   To learn more about what you can do with MyChart, go to [email or web address].    Your next appointment:   3 week(s)  The format for your next appointment:   In Person  Provider:   [name], MD   Other Instructions  Isosorbide Mononitrate extended-release tablets What is this medicine? ISOSORBIDE MONONITRATE (eye soe SOR bide mon oh NYE trate) is a vasodilator. It relaxes blood vessels, increasing the blood and oxygen supply to your heart. This medicine is used to prevent chest pain caused by angina. It will  not help to stop an episode of chest pain. This medicine may be used for other purposes; ask your health care provider or pharmacist if you have questions. COMMON BRAND NAME(S): Imdur, Isotrate ER What should I tell my health care provider before I take this medicine? They need to know if you have any of these conditions:  previous heart attack or heart failure  an unusual or allergic reaction to isosorbide mononitrate, nitrates, other medicines, foods, dyes, or preservatives  pregnant or trying to get pregnant  breast-feeding How should I use this medicine? Take this medicine by mouth with a glass of water. Follow the directions on the prescription label. Do not crush or chew. Take your medicine at regular intervals. Do not take your medicine more often than directed. Do not stop taking this medicine except on the advice of your doctor or health care professional. Talk to your pediatrician regarding the use of this medicine in children. Special care may be needed. Overdosage: If you think you have taken too much of this medicine contact a poison control center or emergency room at once. NOTE: This medicine is only for you. Do not share this medicine with others. What if I miss a dose? If you miss a dose, take it as soon as you can. If it is almost time for your next dose, take only that dose. Do not take double or extra doses. What may interact with this medicine? Do not take this medicine with any of the following medications:  medicines used to treat erectile  dysfunction (ED) like avanafil, sildenafil, tadalafil, and vardenafil  riociguat This medicine may also interact with the following medications:  medicines for high blood pressure  other medicines for angina or heart failure This list may not describe all possible interactions. Give your health care provider a list of all the medicines, herbs, non-prescription drugs, or dietary supplements you use. Also tell them if you smoke,  drink alcohol, or use illegal drugs. Some items may interact with your medicine. What should I watch for while using this medicine? Check your heart rate and blood pressure regularly while you are taking this medicine. Ask your doctor or health care professional what your heart rate and blood pressure should be and when you should contact him or her. Tell your doctor or health care professional if you feel your medicine is no longer working. You may get dizzy. Do not drive, use machinery, or do anything that needs mental alertness until you know how this medicine affects you. To reduce the risk of dizzy or fainting spells, do not sit or stand up quickly, especially if you are an older patient. Alcohol can make you more dizzy, and increase flushing and rapid heartbeats. Avoid alcoholic drinks. Do not treat yourself for coughs, colds, or pain while you are taking this medicine without asking your doctor or health care professional for advice. Some ingredients may increase your blood pressure. What side effects may I notice from receiving this medicine? Side effects that you should report to your doctor or health care professional as soon as possible:  bluish discoloration of lips, fingernails, or palms of hands  irregular heartbeat, palpitations  low blood pressure  nausea, vomiting  persistent headache  unusually weak or tired Side effects that usually do not require medical attention (report to your doctor or health care professional if they continue or are bothersome):  flushing of the face or neck  rash This list may not describe all possible side effects. Call your doctor for medical advice about side effects. You may report side effects to FDA at 1-800-FDA-1088. Where should I keep my medicine? Keep out of the reach of children. Store between 15 and 30 degrees C (59 and 86 degrees F). Keep container tightly closed. Throw away any unused medicine after the expiration date. NOTE: This  sheet is a summary. It may not cover all possible information. If you have questions about this medicine, talk to your doctor, pharmacist, or health care provider.  2021 Elsevier/Gold Standard (2013-04-08 14:48:19)

## 2020-08-23 NOTE — Progress Notes (Signed)
Cardiology Office Note:    Date:  08/23/2020   ID:  Cora Stetson, DOB 1945/10/06, MRN [id number]  PCP:  [name], PA-C  Cardiologist:  [name], MD    Referring MD: [name], *   Chief Complaint  Patient presents with  . Follow-up  . Results    History of Present Illness:    Eddrick Dilone is a 75 y.o. male very complex past medical history. That include coronary artery disease, status post coronary artery bypass graft many years ago, permanent atrial fibrillation, history of lung resection secondary to lung cancer, diabetes, essential hypertension, dyslipidemia, recently diagnosed Parkinson. He showed up in my office and just few weeks ago and would like to be evaluated before potential surgery for his shoulder. As a part of evaluation we did stress test which showed some possibility of old my MI with small area of ischemia. He comes today to my office with his daughter he is not doing well he describes weakness fatigue tiredness she also describes episode that he is getting very short of breath. He can walk very short distance and get short of breath to the point that he cannot talk. He ended up actually going to the emergency room and was admitted to the hospital because of this. He was evaluated by anesthesia before his shoulder surgery and he eventually ended being sent to the emergency room after having that evaluation done. He denies have any chest pain tightness squeezing pressure burning chest but he is diabetic for long. Time.  Past Medical History:  Diagnosis Date  . A-fib ([place])   . Abnormal nuclear stress test 06/12/2016   Formatting of this note might be different from the original. Added automatically from request for surgery [phone] [phone]  . Acquired hypothyroidism 06/05/2016   Last Assessment & Plan:  Formatting of this note might be different from the original. [place] does labs and they will send copies never  . Anxiety   . Arthritis     previous to joint replacement - knees & shoulder.  Pt.'s R foot fused, traumatized from exposure to agent ORANGE  . Atherosclerosis of native coronary artery of native heart with angina pectoris ([place]) 06/12/2016   Overview:  Added automatically from request for surgery [id number]  . Cancer ([place])    lung  . CHF (congestive heart failure) ([place])   . Chronic anticoagulation 07/27/2018   Last Assessment & Plan:  Formatting of this note might be different from the original. Is on the eliquis and no concerns GFR > 60  . Chronic atrial fibrillation ([place]) 04/25/2015   Overview:  Chads score of 4, anticoagulated with warfarin  Formatting of this note might be different from the original. Advised to go to 2 daily and recheck 10 days  Last Assessment & Plan:  Formatting of this note might be different from the original. On the Eliquis now and denies adverse issues Formatting of this note might be different from the original. Overview:  Chads score of 4, anticoagul  . Chronic diastolic congestive heart failure ([place]) 03/05/2018   Last Assessment & Plan:  Formatting of this note might be different from the original. Sees cardio and is UTD and feels well overall  . Class 2 severe obesity due to excess calories with serious comorbidity in adult [facility]) 03/05/2018   Last Assessment & Plan:  Formatting of this note might be different from the original. Is down 7 pounds  ( on purpose ) and is working on  it  . Coronary artery disease   . Coronary artery disease involving native coronary artery of native heart 09/07/2015   Overview:  Left main coronary artery stenting known in March 2017 Cardiac catheterization December 2017 showed patent stent  . Coronary atherosclerosis of native coronary artery 09/07/2015   Formatting of this note might be different from the original. Overview:  Left main coronary artery stenting known in March 2017 Cardiac catheterization December 2017 showed patent stent  Overview:  Added automatically from  request for surgery [id number] Formatting of this note might be different from the original. Left main coronary artery stenting known in March 2017 Cardiac catheterization December  . Dementia without behavioral disturbance ([place]) 11/30/2018   Formatting of this note might be different from the original. Could be sequlae of the vesicare and try to stop it Also, mouth is very dry  F/U sxs p/w  Last Assessment & Plan:  Formatting of this note might be different from the original. He is doing about as expected Some loss and likel a component of Pd as well and polypharmacy already an issue  [facility] is his primary  . Depression   . Diabetes mellitus without complication ([place])    diagnosed- 72yrs.   . Dizziness 10/19/2017  . Dyspnea   . Dyspnea on exertion 07/27/2018   Last Assessment & Plan:  Formatting of this note might be different from the original. Follwing cardio Seen and had tests and has F/U  . Dysrhythmia   . Episode of recurrent major depressive disorder ([place]) 11/30/2018   Formatting of this note might be different from the original. Doing better with the Abilify Per wife  Will continue on and 6 weeks f/u  Last Assessment & Plan:  Formatting of this note might be different from the original. No changes and on SNRI and atypical  . Essential hypertension 04/25/2015   Last Assessment & Plan:  Formatting of this note might be different from the original. Perfect and has had some dose reduction  . Ex-smoker 08/20/2015  . GERD (gastroesophageal reflux disease)   . History of pneumonectomy 03/17/2018  . Hypercholesterolemia 06/05/2016  . Hyperlipidemia   . Hypertension   . Memory difficulty 07/18/2020  . Mixed hyperlipidemia 04/25/2015  . Neuromuscular disorder (H[facility])    neuropathy- feet & legs  . OSA (obstructive sleep apnea) 07/11/2016  . Parkinson disease ([place]) 6 weeks ago  . Parkinson's disease ([place]) 05/22/2020  . Pneumonia    several times, but treated from his home, not hosp.   . Pre-operative clearance  06/17/2017  . Primary malignant neoplasm of left lung ([place]) 01/13/2017  . Rectal cancer ([place]) 03/17/2017  . Shortness of breath 10/07/2017  . Sleep apnea    not able to CPAP, since lung beiing removed- wife reports no problem.   . Small cell carcinoma of left lung ([place]) 04/25/2015  . Spondylolisthesis of lumbar region 06/25/2017  . Stroke (cerebrum) [facility])    as a 75 y.o.   Marland Kitchen Type 2 diabetes mellitus with diabetic polyneuropathy, without long-term current use of insulin ([place]) 03/05/2018   diagnosed- 40yrs.  Last Assessment & Plan:  Formatting of this note might be different from the original. Excellent control Would check every 6 months--3 months ago was 7  . Type 2 diabetes mellitus without complication ([place]) 73/12/1060    Past Surgical History:  Procedure Laterality Date  . ANKLE FUSION    . CARDIAC CATHETERIZATION    . EYE SURGERY Left    cataract removed   .  JOINT REPLACEMENT Left 2016   shoulder replacement   . LUNG LOBECTOMY Left 2016   done at [facility]    . SKIN CANCER EXCISION      Current Medications: Current Meds  Medication Sig  . albuterol (PROVENTIL) (2.5 MG/3ML) 0.083% nebulizer solution Inhale 2.5 mg into the lungs as needed for wheezing.  . Alogliptin Benzoate 25 MG TABS Take 1 tablet by mouth daily. In the morning'  . apixaban (ELIQUIS) 5 MG TABS tablet Take 5 mg by mouth 2 (two) times daily.  . ARIPiprazole (ABILIFY) 5 MG tablet Take 1 tablet by mouth daily.  Marland Kitchen atorvastatin (LIPITOR) 80 MG tablet Take 40 mg by mouth daily.   . Cholecalciferol (VITAMIN D) 2000 units tablet Take 4,000 Units by mouth daily.  . clopidogrel (PLAVIX) 75 MG tablet TAKE ONE TABLET BY MOUTH ONCE DAILY WITH BREAKFAST  . cyanocobalamin (,VITAMIN B-12,) 1000 MCG/ML injection Inject 1 mL into the skin every 30 (thirty) days.  . digoxin (LANOXIN) 0.25 MG tablet Take 0.25 mg by mouth at bedtime.   . DULoxetine (CYMBALTA) 60 MG capsule Take 120 mg by mouth  daily after breakfast.  . empagliflozin (JARDIANCE) 25 MG TABS tablet Take 0.5 tablets by mouth daily.  . ferrous sulfate 325 (65 FE) MG EC tablet Take 325 mg by mouth every other day.  . gabapentin (NEURONTIN) 400 MG capsule Take 400-800 mg by mouth See admin instructions. Take 400 mg by mouth in the morning and take 800 mg by mouth at bedtime  . glipiZIDE (GLUCOTROL) 10 MG tablet Take 10 mg by mouth 2 (two) times daily.  . isosorbide mononitrate (IMDUR) 30 MG 24 hr tablet Take 1 tablet (30 mg total) by mouth daily.  Marland Kitchen levothyroxine (SYNTHROID) 100 MCG tablet Take 1 tablet by mouth daily.  Marland Kitchen lisinopril (PRINIVIL,ZESTRIL) 20 MG tablet Take 20 mg by mouth daily.  . magnesium oxide (MAG-OX) 400 MG tablet Take 420 mg by mouth daily.  . metoprolol tartrate (LOPRESSOR) 50 MG tablet Take 25 mg by mouth 2 (two) times daily.  . Multiple Vitamin (MULTIVITAMIN ADULT PO) Take 1 tablet by mouth daily.  Marland Kitchen omeprazole (PRILOSEC OTC) 20 MG tablet Take 20 mg by mouth at bedtime.  Marland Kitchen QUEtiapine (SEROQUEL) 100 MG tablet Take 150 mg by mouth at bedtime.   . solifenacin (VESICARE) 5 MG tablet Take 5 mg by mouth daily.   . tamsulosin (FLOMAX) 0.4 MG CAPS capsule Take 0.4 mg by mouth at bedtime.  . vitamin C (ASCORBIC ACID) 250 MG tablet Take 500 mg by mouth daily.  Marland Kitchen zinc gluconate 50 MG tablet Take 1 tablet by mouth at bedtime.     Allergies:   Patient has no known allergies.   Social History   Socioeconomic History  . Marital status: Married    Spouse name: Not on file  . Number of children: Not on file  . Years of education: Not on file  . Highest education level: Not on file  Occupational History  . Not on file  Tobacco Use  . Smoking status: Former Smoker    Quit date: 06/18/1994    Years since quitting: 26.2  . Smokeless tobacco: Never Used  Vaping Use  . Vaping Use: Never used  Substance and Sexual Activity  . Alcohol use: No  . Drug use: No  . Sexual activity: Not on file  Other Topics  Concern  . Not on file  Social History Narrative  .  Not on file   Social Determinants of Health   Financial Resource Strain: Not on file  Food Insecurity: Not on file  Transportation Needs: Not on file  Physical Activity: Not on file  Stress: Not on file  Social Connections: Not on file     Family History: The patient's family history includes Depression in his mother; Early death in his brother, father, maternal grandmother, and maternal uncle; Heart attack in his brother, father, and paternal uncle; Heart disease in his brother and father; Heart failure in his brother and father; Hyperlipidemia in his brother and father; Hypertension in his brother and father; Osteoarthritis in his father; Rheum arthritis in his father; Stroke in his maternal grandmother and maternal uncle. ROS:   Please see the history of present illness.    All 14 point review of systems negative except as described per history of present illness  EKGs/Labs/Other Studies Reviewed:      Recent Labs: No results found for requested labs within last 8760 hours.  Recent Lipid Panel No results found for: CHOL, TRIG, HDL, CHOLHDL, VLDL, LDLCALC, LDLDIRECT  Physical Exam:    VS:  BP 138/78 (BP Location: Right Arm, Patient Position: Sitting)   Pulse (!) 101   Ht 6' (1.829 m)   Wt 277 lb (125.6 kg)   SpO2 98%   BMI 37.57 kg/m     Wt Readings from Last 3 Encounters:  08/23/20 277 lb (125.6 kg)  08/15/20 280 lb (127 kg)  08/07/20 280 lb (127 kg)     GEN:  Well nourished, well developed in no acute distress HEENT: Normal NECK: No JVD; No carotid bruits LYMPHATICS: No lymphadenopathy CARDIAC: Irregularly irregular, no murmurs, no rubs, no gallops RESPIRATORY:  Clear to auscultation without rales, wheezing or rhonchi  ABDOMEN: Soft, non-tender, non-distended MUSCULOSKELETAL:  No edema; No deformity  SKIN: Warm and dry LOWER EXTREMITIES: no swelling NEUROLOGIC:  Alert and oriented x 3 PSYCHIATRIC:  Normal  affect   ASSESSMENT:    1. Coronary artery disease involving native coronary artery of native heart without angina pectoris   2. Chronic atrial fibrillation (HCC)   3. Essential hypertension   4. Parkinson's disease ([place])   5. Mixed hyperlipidemia   6. Dyspnea on exertion    PLAN:    In order of problems listed above:  1. Coronary artery disease his stress test showed small area of ischemia involving inferior wall which look like peri-infarct ischemia, echocardiogram however showed preserved left ventricle ejection fraction. We discussed option for his situation today. He does have some symptoms that unclear. They could be a angina equivalent. Therefore we decided to initiate more aggressive approach to management of his CAD. I will ask him with start taking Imdur 30 mg daily. Also told him to get in touch with pulmonary doctor and talk to them and see if there is an explanation for his symptomatology in that area now, he also does have Parkinson which to some degree can contribute to his symptomatology. 2. Chronic atrial fibrillation seems to control. On appropriate medications. 3. Essential hypertension they describe blood pressure being high when he got those episodes of shortness of breath. I think that is a secondary issue I will however add Imdur which should help with the blood pressure as well. 4. Mixed dyslipidemia continue present management. 5. Potential incoming shoulder surgery. He decided that he is not interested in this for now and I agree with him that surgery will be at least intermediate if not high risks  and in his clinical scenario right now probably not desirable.   Medication Adjustments/Labs and Tests Ordered: Current medicines are reviewed at length with the patient today.  Concerns regarding medicines are outlined above.  No orders of the defined types were placed in this encounter.  Medication changes:  Meds ordered this encounter  Medications  . isosorbide  mononitrate (IMDUR) 30 MG 24 hr tablet    Sig: Take 1 tablet (30 mg total) by mouth daily.    Dispense:  90 tablet    Refill:  1    Signed, Park Liter, MD, [facility] 08/23/2020 9:53 AM    [place]

## 2020-08-29 DIAGNOSIS — E291 Testicular hypofunction: Secondary | ICD-10-CM

## 2020-08-29 DIAGNOSIS — L603 Nail dystrophy: Secondary | ICD-10-CM

## 2020-08-29 DIAGNOSIS — IMO0002 Reserved for concepts with insufficient information to code with codable children: Secondary | ICD-10-CM

## 2020-08-29 DIAGNOSIS — L84 Corns and callosities: Secondary | ICD-10-CM

## 2020-08-29 DIAGNOSIS — C3490 Malignant neoplasm of unspecified part of unspecified bronchus or lung: Secondary | ICD-10-CM

## 2020-08-29 DIAGNOSIS — I872 Venous insufficiency (chronic) (peripheral): Secondary | ICD-10-CM

## 2020-08-29 DIAGNOSIS — E1140 Type 2 diabetes mellitus with diabetic neuropathy, unspecified: Secondary | ICD-10-CM | POA: Insufficient documentation

## 2020-08-29 HISTORY — DX: Reserved for concepts with insufficient information to code with codable children: IMO0002

## 2020-08-29 HISTORY — DX: Testicular hypofunction: E29.1

## 2020-08-29 HISTORY — DX: Venous insufficiency (chronic) (peripheral): I87.2

## 2020-08-29 HISTORY — DX: Hypomagnesemia: E83.42

## 2020-08-29 HISTORY — DX: Corns and callosities: L84

## 2020-08-29 HISTORY — DX: Nail dystrophy: L60.3

## 2020-08-29 HISTORY — DX: Type 2 diabetes mellitus with diabetic neuropathy, unspecified: E11.40

## 2020-08-29 HISTORY — DX: Malignant neoplasm of unspecified part of unspecified bronchus or lung: C34.90

## 2020-09-04 ENCOUNTER — Ambulatory Visit (INDEPENDENT_AMBULATORY_CARE_PROVIDER_SITE_OTHER): Payer: Medicare Other | Admitting: Cardiology

## 2020-09-04 ENCOUNTER — Other Ambulatory Visit: Payer: Self-pay

## 2020-09-04 ENCOUNTER — Encounter: Payer: Self-pay | Admitting: Cardiology

## 2020-09-04 VITALS — BP 126/76 | HR 66 | Ht 72.0 in | Wt 271.0 lb

## 2020-09-04 DIAGNOSIS — I2510 Atherosclerotic heart disease of native coronary artery without angina pectoris: Secondary | ICD-10-CM | POA: Diagnosis not present

## 2020-09-04 DIAGNOSIS — I4821 Permanent atrial fibrillation: Secondary | ICD-10-CM | POA: Diagnosis not present

## 2020-09-04 DIAGNOSIS — G20 Parkinson's disease: Secondary | ICD-10-CM

## 2020-09-04 DIAGNOSIS — I10 Essential (primary) hypertension: Secondary | ICD-10-CM | POA: Diagnosis not present

## 2020-09-04 MED ORDER — ISOSORBIDE MONONITRATE ER 60 MG PO TB24: 60.0000 mg | ORAL_TABLET | Freq: Every day | ORAL | 1 refills | Status: DC

## 2020-09-04 NOTE — Patient Instructions (Signed)
Medication Instructions:  Your physician has recommended you make the following change in your medication:   INCREASE: Imdur 60 mg daily   *If you need a refill on your cardiac medications before your next appointment, please call your pharmacy*   Lab Work: none If you have labs (blood work) drawn today and your tests are completely normal, you will receive your results only by: Marland Kitchen MyChart Message (if you have MyChart) OR . A paper copy in the mail If you have any lab test that is abnormal or we need to change your treatment, we will call you to review the results.   Testing/Procedures: none   Follow-Up: At [facility], you and your health needs are our priority.  As part of our continuing mission to provide you with exceptional heart care, we have created designated Provider Care Teams.  These Care Teams include your primary Cardiologist (physician) and Advanced Practice Providers (APPs -  Physician Assistants and Nurse Practitioners) who all work together to provide you with the care you need, when you need it.  We recommend signing up for the patient portal called "MyChart".  Sign up information is provided on this After Visit Summary.  MyChart is used to connect with patients for Virtual Visits (Telemedicine).  Patients are able to view lab/test results, encounter notes, upcoming appointments, etc.  Non-urgent messages can be sent to your provider as well.   To learn more about what you can do with MyChart, go to [email or web address].    Your next appointment:   3 month(s)  The format for your next appointment:   In Person  Provider:   [name], MD   Other Instructions

## 2020-09-04 NOTE — Addendum Note (Signed)
Addended by: Senaida Ores on: 09/04/2020 01:11 PM   Modules accepted: Orders

## 2020-09-04 NOTE — Progress Notes (Signed)
Cardiology Office Note:    Date:  09/04/2020   ID:  Travis Watts, DOB [birth year], MRN [id number]  PCP:  [name], PA-C  Cardiologist:  [name], MD    Referring MD: [name], *   Chief Complaint  Patient presents with  . Follow-up  Doing slightly better but my stress test is not normal  History of Present Illness:    Travis Watts is a 75 y.o. male   very complex past medical history. That include coronary artery disease, status post coronary artery bypass graft many years ago, permanent atrial fibrillation, history of lung resection secondary to lung cancer, diabetes, essential hypertension, dyslipidemia, recently diagnosed Parkinson. Comes today 2 months of follow-up overall seems to be doing fair.  I did give him on Imdur 30 mg daily last time he said he may feel little bit better but still have this episode of shortness of breath.  Still awaiting appointment with pulmonary.  He said he spent majority of time just sitting watching TV.  He did have a stress test which showed small area of ischemia again only very small and conservative approach is doing with proceeding.  Past Medical History:  Diagnosis Date  . A-fib ([place])   . Abnormal nuclear stress test 06/12/2016   Formatting of this note might be different from the original. Added automatically from request for surgery [phone]  . Acquired hypothyroidism 06/05/2016   Last Assessment & Plan:  Formatting of this note might be different from the original. [place] does labs and they will send copies never  . Anxiety   . Anxiety state 06/05/2016  . Arthritis    previous to joint replacement - knees & shoulder.  Pt.'s R foot fused, traumatized from exposure to agent ORANGE  . Atherosclerosis of native coronary artery of native heart with angina pectoris ([place]) 06/12/2016   Overview:  Added automatically from request for surgery [id number]  . Atrial fibrillation ([place]) 04/25/2015   Overview:  Chads score  of 4, anticoagulated with warfarin  Formatting of this note might be different from the original. Advised to go to 2 daily and recheck 10 days  Last Assessment & Plan:  Formatting of this note might be different from the original. On the Eliquis now and denies adverse issues Formatting of this note might be different from the original. Overview:  Chads score of 4, anticoagul  . Benign prostatic hyperplasia with urinary frequency 08/22/2020   Last Assessment & Plan:  Formatting of this note might be different from the original. Suspect most likely Doubt florinef but ?? As steroid and could stop as was intitially started for hypotension and that is not longer the case On very low dose at 0.1 and see what happens with stopping that Already on flomax and hesitant to do a bunch of meds and urology referral until his heart situation is sett  . Body mass index (BMI) 40.0-44.9, adult ([place]) 03/11/2019  . Callus 08/29/2020  . Cancer ([place])    lung  . CHF (congestive heart failure) ([place])   . Chronic anticoagulation 07/27/2018   Last Assessment & Plan:  Formatting of this note might be different from the original. Is on the eliquis and no concerns GFR > 60  . Chronic atrial fibrillation ([place]) 04/25/2015   Overview:  Chads score of 4, anticoagulated with warfarin  Formatting of this note might be different from the original. Advised to go to 2 daily and recheck 10 days  Last Assessment &  Plan:  Formatting of this note might be different from the original. On the Eliquis now and denies adverse issues Formatting of this note might be different from the original. Overview:  Chads score of 4, anticoagul  . Chronic diastolic congestive heart failure ([place]) 03/05/2018   Last Assessment & Plan:  Formatting of this note might be different from the original. Sees cardio and is UTD and feels well overall  . Class 2 severe obesity due to excess calories with serious comorbidity in adult [facility]) 03/05/2018   Last Assessment & Plan:   Formatting of this note might be different from the original. Is down 7 pounds  ( on purpose ) and is working on it  . Coronary artery disease   . Coronary artery disease involving native coronary artery of native heart 09/07/2015   Overview:  Left main coronary artery stenting known in March 2017 Cardiac catheterization December 2017 showed patent stent  . Coronary atherosclerosis of native coronary artery 09/07/2015   Formatting of this note might be different from the original. Overview:  Left main coronary artery stenting known in March 2017 Cardiac catheterization December 2017 showed patent stent  Overview:  Added automatically from request for surgery [id number] Formatting of this note might be different from the original. Left main coronary artery stenting known in March 2017 Cardiac catheterization December  . Dementia without behavioral disturbance ([place]) 11/30/2018   Formatting of this note might be different from the original. Could be sequlae of the vesicare and try to stop it Also, mouth is very dry  F/U sxs p/w  Last Assessment & Plan:  Formatting of this note might be different from the original. He is doing about as expected Some loss and likel a component of Pd as well and polypharmacy already an issue  [facility] is his primary  . Depression   . Diabetes mellitus without complication ([place])    diagnosed- 17yrs.   . Diabetic neuropathy ([place]) 08/29/2020  . Dizziness 10/19/2017  . Dyspnea   . Dyspnea on exertion 07/27/2018   Last Assessment & Plan:  Formatting of this note might be different from the original. Follwing cardio Seen and had tests and has F/U  . Dysrhythmia   . Dystrophic nail 08/29/2020  . Episode of recurrent major depressive disorder ([place]) 11/30/2018   Formatting of this note might be different from the original. Doing better with the Abilify Per wife  Will continue on and 6 weeks f/u  Last Assessment & Plan:  Formatting of this note might be different from the original. No changes and on  SNRI and atypical  . Essential (primary) hypertension 04/25/2015   Last Assessment & Plan:  Formatting of this note might be different from the original. Perfect and has had some dose reduction  . Essential hypertension 04/25/2015   Last Assessment & Plan:  Formatting of this note might be different from the original. Perfect and has had some dose reduction  . Ex-smoker 08/20/2015  . GERD (gastroesophageal reflux disease)   . Hip pain 08/01/2019  . History of pneumonectomy 03/17/2018  . Hypercholesterolemia 06/05/2016  . Hyperlipidemia   . Hypertension   . Hypomagnesemia 08/29/2020  . Malignant neoplasm of bronchus and lung ([place]) 08/29/2020   Mar 16, 2012 Entered By: Sydnee Cabal Comment: getting surgery locally  . Memory difficulty 07/18/2020  . Mixed hyperlipidemia 04/25/2015  . Myofascial pain 04/11/2019  . Neuromuscular disorder (H[facility])    neuropathy- feet & legs  . OSA (obstructive sleep apnea) 07/11/2016  .  Other secondary scoliosis, site unspecified 03/11/2019  . Other testicular hypofunction 08/29/2020  . Parkinson disease ([place]) 6 weeks ago  . Parkinson's disease ([place]) 05/22/2020  . Pneumonia    several times, but treated from his home, not hosp.   . Pre-operative clearance 06/17/2017  . Primary malignant neoplasm of left lung ([place]) 01/13/2017  . Rectal cancer ([place]) 03/17/2017  . Sacroiliitis ([place]) 04/11/2019  . Shortness of breath 10/07/2017  . Sleep apnea    not able to CPAP, since lung beiing removed- wife reports no problem.   . Small cell carcinoma of left lung ([place]) 04/25/2015  . Spinal stenosis of lumbar region 06/03/2017  . Spondylolisthesis of lumbar region 06/25/2017  . Stroke (cerebrum) [facility])    as a 75 y.o.   . Suicide and self-inflicted injury by hanging, strangulation, and suffocation 08/29/2020  . Type 2 diabetes mellitus with diabetic polyneuropathy, without long-term current use of insulin ([place]) 03/05/2018   diagnosed- 42yrs.  Last Assessment & Plan:  Formatting of this note  might be different from the original. Excellent control Would check every 6 months--3 months ago was 7  . Type 2 diabetes mellitus without complication ([place]) 33/07/9516  . Venous (peripheral) insufficiency 08/29/2020    Past Surgical History:  Procedure Laterality Date  . ANKLE FUSION    . CARDIAC CATHETERIZATION    . EYE SURGERY Left    cataract removed   . JOINT REPLACEMENT Left 2016   shoulder replacement   . LUNG LOBECTOMY Left 2016   done at [place]    . SKIN CANCER EXCISION      Current Medications: Current Meds  Medication Sig  . albuterol (PROVENTIL) (2.5 MG/3ML) 0.083% nebulizer solution Inhale 2.5 mg into the lungs as needed for wheezing.  . Alogliptin Benzoate 25 MG TABS Take 1 tablet by mouth daily. In the morning'  . apixaban (ELIQUIS) 5 MG TABS tablet Take 5 mg by mouth 2 (two) times daily.  . ARIPiprazole (ABILIFY) 5 MG tablet Take 1 tablet by mouth daily.  Marland Kitchen atorvastatin (LIPITOR) 80 MG tablet Take 40 mg by mouth daily.   . Cholecalciferol (VITAMIN D) 2000 units tablet Take 4,000 Units by mouth daily.  . clopidogrel (PLAVIX) 75 MG tablet TAKE ONE TABLET BY MOUTH ONCE DAILY WITH BREAKFAST  . cyanocobalamin (,VITAMIN B-12,) 1000 MCG/ML injection Inject 1 mL into the skin every 30 (thirty) days.  . digoxin (LANOXIN) 0.25 MG tablet Take 0.25 mg by mouth at bedtime.   . DULoxetine (CYMBALTA) 60 MG capsule Take 120 mg by mouth daily after breakfast.  . empagliflozin (JARDIANCE) 25 MG TABS tablet Take 0.5 tablets by mouth daily.  . ferrous sulfate 325 (65 FE) MG EC tablet Take 325 mg by mouth every other day.  . gabapentin (NEURONTIN) 400 MG capsule Take 400-800 mg by mouth See admin instructions. Take 400 mg by mouth in the morning and take 800 mg by mouth at bedtime  . glipiZIDE (GLUCOTROL) 10 MG tablet Take 10 mg by mouth 2 (two) times daily.  . isosorbide mononitrate (IMDUR) 30 MG 24 hr tablet Take 1 tablet (30 mg total) by mouth  daily.  Marland Kitchen levothyroxine (SYNTHROID) 100 MCG tablet Take 1 tablet by mouth daily.  Marland Kitchen lisinopril (PRINIVIL,ZESTRIL) 20 MG tablet Take 20 mg by mouth daily.  . magnesium oxide (MAG-OX) 400 MG tablet Take 420 mg by mouth daily.  . metoprolol tartrate (LOPRESSOR) 50 MG tablet Take 25 mg by mouth  2 (two) times daily.  . Multiple Vitamin (MULTIVITAMIN ADULT PO) Take 1 tablet by mouth daily.  Marland Kitchen omeprazole (PRILOSEC OTC) 20 MG tablet Take 20 mg by mouth at bedtime.  Marland Kitchen QUEtiapine (SEROQUEL) 100 MG tablet Take 150 mg by mouth at bedtime.   . solifenacin (VESICARE) 5 MG tablet Take 5 mg by mouth daily.   . tamsulosin (FLOMAX) 0.4 MG CAPS capsule Take 0.4 mg by mouth at bedtime.  . vitamin C (ASCORBIC ACID) 250 MG tablet Take 500 mg by mouth daily.  Marland Kitchen zinc gluconate 50 MG tablet Take 1 tablet by mouth at bedtime.     Allergies:   Patient has no known allergies.   Social History   Socioeconomic History  . Marital status: Married    Spouse name: Not on file  . Number of children: Not on file  . Years of education: Not on file  . Highest education level: Not on file  Occupational History  . Not on file  Tobacco Use  . Smoking status: Former Smoker    Quit date: 06/18/1994    Years since quitting: 26.2  . Smokeless tobacco: Never Used  Vaping Use  . Vaping Use: Never used  Substance and Sexual Activity  . Alcohol use: No  . Drug use: No  . Sexual activity: Not on file  Other Topics Concern  . Not on file  Social History Narrative  . Not on file   Social Determinants of Health   Financial Resource Strain: Not on file  Food Insecurity: Not on file  Transportation Needs: Not on file  Physical Activity: Not on file  Stress: Not on file  Social Connections: Not on file     Family History: The patient's family history includes Depression in his mother; Early death in his brother, father, maternal grandmother, and maternal uncle; Heart attack in his brother, father, and paternal uncle;  Heart disease in his brother and father; Heart failure in his brother and father; Hyperlipidemia in his brother and father; Hypertension in his brother and father; Osteoarthritis in his father; Rheum arthritis in his father; Stroke in his maternal grandmother and maternal uncle. ROS:   Please see the history of present illness.    All 14 point review of systems negative except as described per history of present illness  EKGs/Labs/Other Studies Reviewed:      Recent Labs: No results found for requested labs within last 8760 hours.  Recent Lipid Panel No results found for: CHOL, TRIG, HDL, CHOLHDL, VLDL, LDLCALC, LDLDIRECT  Physical Exam:    VS:  BP 126/76 (BP Location: Left Arm, Patient Position: Sitting)   Pulse 66   Ht 6' (1.829 m)   Wt 271 lb (122.9 kg)   SpO2 92%   BMI 36.75 kg/m     Wt Readings from Last 3 Encounters:  09/04/20 271 lb (122.9 kg)  08/23/20 277 lb (125.6 kg)  08/15/20 280 lb (127 kg)     GEN:  Well nourished, well developed in no acute distress HEENT: Normal NECK: No JVD; No carotid bruits LYMPHATICS: No lymphadenopathy CARDIAC: Irregular irregular, no murmurs, no rubs, no gallops RESPIRATORY: Poor entry of the left side ABDOMEN: Soft, non-tender, non-distended MUSCULOSKELETAL:  No edema; No deformity  SKIN: Warm and dry LOWER EXTREMITIES: no swelling NEUROLOGIC:  Alert and oriented x 3 PSYCHIATRIC:  Normal affect   ASSESSMENT:    1. Coronary artery disease involving native coronary artery of native heart without angina pectoris   2. Primary hypertension  3. Permanent atrial fibrillation ([place])   4. Parkinson's disease ([place])    PLAN:    In order of problems listed above:  1. Coronary disease stable from that point review he does have mildly abnormal stress test symptoms are very nonspecific I think there is some value in increasing medical therapy I will go with Imdur from 30 to 60 mg we will continue rest of the medications. 2. Essential  hypertension blood pressure well controlled continue present management. 3. Permanent atrial fibrillation, rate is controlled, he is on anticoagulation which I will continue. 4. Parkinson's disease followed by neurology.   Medication Adjustments/Labs and Tests Ordered: Current medicines are reviewed at length with the patient today.  Concerns regarding medicines are outlined above.  No orders of the defined types were placed in this encounter.  Medication changes: No orders of the defined types were placed in this encounter.   Signed, Park Liter, MD, [facility] 09/04/2020 1:04 PM    [place]

## 2020-09-12 DIAGNOSIS — R531 Weakness: Secondary | ICD-10-CM | POA: Insufficient documentation

## 2020-09-12 DIAGNOSIS — N3090 Cystitis, unspecified without hematuria: Secondary | ICD-10-CM | POA: Insufficient documentation

## 2020-11-01 ENCOUNTER — Other Ambulatory Visit: Payer: Self-pay | Admitting: Cardiology

## 2020-11-20 ENCOUNTER — Ambulatory Visit: Payer: Medicare Other | Admitting: Cardiology

## 2020-12-05 ENCOUNTER — Ambulatory Visit: Payer: Medicare Other | Admitting: Cardiology

## 2021-01-07 ENCOUNTER — Other Ambulatory Visit: Payer: Self-pay

## 2021-01-07 DIAGNOSIS — K027 Dental root caries: Secondary | ICD-10-CM | POA: Insufficient documentation

## 2021-01-08 ENCOUNTER — Other Ambulatory Visit: Payer: Self-pay

## 2021-01-08 ENCOUNTER — Ambulatory Visit (INDEPENDENT_AMBULATORY_CARE_PROVIDER_SITE_OTHER): Payer: Medicare Other | Admitting: Cardiology

## 2021-01-08 ENCOUNTER — Encounter: Payer: Self-pay | Admitting: Cardiology

## 2021-01-08 VITALS — BP 98/52 | HR 78 | Ht 72.0 in | Wt 268.0 lb

## 2021-01-08 DIAGNOSIS — I10 Essential (primary) hypertension: Secondary | ICD-10-CM

## 2021-01-08 DIAGNOSIS — Z719 Counseling, unspecified: Secondary | ICD-10-CM | POA: Insufficient documentation

## 2021-01-08 DIAGNOSIS — K08433 Partial loss of teeth due to caries, class III: Secondary | ICD-10-CM | POA: Insufficient documentation

## 2021-01-08 DIAGNOSIS — Z01810 Encounter for preprocedural cardiovascular examination: Secondary | ICD-10-CM | POA: Insufficient documentation

## 2021-01-08 DIAGNOSIS — Z741 Need for assistance with personal care: Secondary | ICD-10-CM | POA: Insufficient documentation

## 2021-01-08 DIAGNOSIS — I4821 Permanent atrial fibrillation: Secondary | ICD-10-CM

## 2021-01-08 MED ORDER — ISOSORBIDE MONONITRATE ER 30 MG PO TB24: 30.0000 mg | ORAL_TABLET | Freq: Every day | ORAL | 3 refills | Status: AC

## 2021-01-08 NOTE — Progress Notes (Signed)
Cardiology Office Note:    Date:  01/08/2021   ID:  Travis Watts, DOB [birth year], MRN [id number]  PCP:  [name], PA-C  Cardiologist:  [name], MD    Referring MD: [name], *   Chief Complaint  Patient presents with   Shortness of Breath    History of Present Illness:    Travis Watts is a 75 y.o. male with very complex past medical history that includes coronary artery disease, status post coronary artery bypass graft years ago, permanent atrial fibrillation, history of lung resection secondary to lung cancer, diabetes, essential hypertension, dyslipidemia, Parkinson. Is coming to my office for follow-up.  Overall he seems to be stabilized.  Described to have like always fatigue tiredness and shortness of breath.  But not worse.  Denies have any chest pain tightness squeezing pressure burning chest no palpitations.  In April he end up going to the hospital because of episodes of syncope.  Quite extensive evaluation has been done however has been unrevealing.  Since that time he is doing well.  I did review notes for this visit from that hospitalization.  Past Medical History:  Diagnosis Date   A-fib [facility])    Abnormal nuclear stress test 06/12/2016   Formatting of this note might be different from the original. Added automatically from request for surgery [id number]   Acquired hypothyroidism 06/05/2016   Last Assessment & Plan:  Formatting of this note might be different from the original. [place] does labs and they will send copies never   Anxiety    Anxiety state 06/05/2016   Arthritis    previous to joint replacement - knees & shoulder.  Pt.'s R foot fused, traumatized from exposure to agent [place]   Atherosclerosis of native coronary artery of native heart with angina pectoris ([place]) 06/12/2016   Overview:  Added automatically from request for surgery [id number]   Atrial fibrillation ([place]) 04/25/2015   Overview:  Chads score of 4, anticoagulated  with warfarin  Formatting of this note might be different from the original. Advised to go to 2 daily and recheck 10 days  Last Assessment & Plan:  Formatting of this note might be different from the original. On the Eliquis now and denies adverse issues Formatting of this note might be different from the original. Overview:  Chads score of 4, anticoagul   Benign prostatic hyperplasia with urinary frequency 08/22/2020   Last Assessment & Plan:  Formatting of this note might be different from the original. Suspect most likely Doubt florinef but ?? As steroid and could stop as was intitially started for hypotension and that is not longer the case On very low dose at 0.1 and see what happens with stopping that Already on flomax and hesitant to do a bunch of meds and urology referral until his heart situation is sett   Body mass index (BMI) 40.0-44.9, adult ([place]) 03/11/2019   Callus 08/29/2020   Cancer ([place])    lung   CHF (congestive heart failure) ([place])    Chronic anticoagulation 07/27/2018   Last Assessment & Plan:  Formatting of this note might be different from the original. Is on the eliquis and no concerns GFR > 60   Chronic atrial fibrillation ([place]) 04/25/2015   Overview:  Chads score of 4, anticoagulated with warfarin  Formatting of this note might be different from the original. Advised to go to 2 daily and recheck 10 days  Last Assessment & Plan:  Formatting of this note  might be different from the original. On the Eliquis now and denies adverse issues Formatting of this note might be different from the original. Overview:  Chads score of 4, anticoagul   Chronic diastolic congestive heart failure ([place]) 03/05/2018   Last Assessment & Plan:  Formatting of this note might be different from the original. Sees cardio and is UTD and feels well overall   Class 2 severe obesity due to excess calories with serious comorbidity in adult [facility]) 03/05/2018   Last Assessment & Plan:  Formatting of this note might be  different from the original. Is down 7 pounds  ( on purpose ) and is working on it   Coronary artery disease    Coronary artery disease involving native coronary artery of native heart 09/07/2015   Overview:  Left main coronary artery stenting known in March 2017 Cardiac catheterization December 2017 showed patent stent   Coronary atherosclerosis of native coronary artery 09/07/2015   Formatting of this note might be different from the original. Overview:  Left main coronary artery stenting known in March 2017 Cardiac catheterization December 2017 showed patent stent  Overview:  Added automatically from request for surgery [id number] Formatting of this note might be different from the original. Left main coronary artery stenting known in March 2017 Cardiac catheterization December   Dementia without behavioral disturbance ([place]) 11/30/2018   Formatting of this note might be different from the original. Could be sequlae of the vesicare and try to stop it Also, mouth is very dry  F/U sxs p/w  Last Assessment & Plan:  Formatting of this note might be different from the original. He is doing about as expected Some loss and likel a component of Pd as well and polypharmacy already an issue  [facility] is his [facility]   Depression    Diabetes mellitus without complication ([place])    diagnosed- 69yrs.    Diabetic neuropathy ([place]) 08/29/2020   Dizziness 10/19/2017   Dyspnea    Dyspnea on exertion 07/27/2018   Last Assessment & Plan:  Formatting of this note might be different from the original. Follwing cardio Seen and had tests and has F/U   Dysrhythmia    Dystrophic nail 08/29/2020   Episode of recurrent major depressive disorder ([place]) 11/30/2018   Formatting of this note might be different from the original. Doing better with the Abilify Per wife  Will continue on and 6 weeks f/u  Last Assessment & Plan:  Formatting of this note might be different from the original. No changes and on SNRI and atypical   Essential (primary)  hypertension 04/25/2015   Last Assessment & Plan:  Formatting of this note might be different from the original. Perfect and has had some dose reduction   Essential hypertension 04/25/2015   Last Assessment & Plan:  Formatting of this note might be different from the original. Perfect and has had some dose reduction   Ex-smoker 08/20/2015   GERD (gastroesophageal reflux disease)    Hip pain 08/01/2019   History of pneumonectomy 03/17/2018   Hypercholesterolemia 06/05/2016   Hyperlipidemia    Hypertension    Hypomagnesemia 08/29/2020   Malignant neoplasm of bronchus and lung ([place]) 08/29/2020   Mar 16, 2012 Entered By: Sydnee Cabal Comment: getting surgery locally   Memory difficulty 07/18/2020   Mixed hyperlipidemia 04/25/2015   Myofascial pain 04/11/2019   Neuromuscular disorder (HCC)    neuropathy- feet & legs   OSA (obstructive sleep apnea) 07/11/2016   Other secondary scoliosis, site unspecified  03/11/2019   Other testicular hypofunction 08/29/2020   Parkinson disease ([place]) 6 weeks ago   Parkinson's disease ([place]) 05/22/2020   Pneumonia    several times, but treated from his home, not hosp.    Pre-operative clearance 06/17/2017   Primary malignant neoplasm of left lung ([place]) 01/13/2017   Rectal cancer ([place]) 03/17/2017   Sacroiliitis ([place]) 04/11/2019   Shortness of breath 10/07/2017   Sleep apnea    not able to CPAP, since lung beiing removed- wife reports no problem.    Small cell carcinoma of left lung ([place]) 04/25/2015   Spinal stenosis of lumbar region 06/03/2017   Spondylolisthesis of lumbar region 06/25/2017   Stroke (cerebrum) [facility])    as a 75 y.o.    Suicide and self-inflicted injury by hanging, strangulation, and suffocation 08/29/2020   Type 2 diabetes mellitus with diabetic polyneuropathy, without long-term current use of insulin ([place]) 03/05/2018   diagnosed- 63yrs.  Last Assessment & Plan:  Formatting of this note might be different from the original. Excellent control Would check every 6  months--3 months ago was 7   Type 2 diabetes mellitus without complication ([place]) 23/10/5730   Venous (peripheral) insufficiency 08/29/2020    Past Surgical History:  Procedure Laterality Date   ANKLE FUSION     CARDIAC CATHETERIZATION     EYE SURGERY Left    cataract removed    JOINT REPLACEMENT Left 2016   shoulder replacement    LUNG LOBECTOMY Left 2016   done at [place]      Current Medications: Current Meds  Medication Sig   albuterol (PROVENTIL) (2.5 MG/3ML) 0.083% nebulizer solution Inhale 2.5 mg into the lungs as needed for wheezing.   Alogliptin Benzoate 25 MG TABS Take 1 tablet by mouth daily. In the morning'   apixaban (ELIQUIS) 5 MG TABS tablet Take 5 mg by mouth 2 (two) times daily.   ARIPiprazole (ABILIFY) 5 MG tablet Take 1 tablet by mouth daily.   ascorbic acid (VITAMIN C) 500 MG tablet Take 500 mg by mouth daily.   aspirin 81 MG chewable tablet Chew 81 mg by mouth daily.   atorvastatin (LIPITOR) 80 MG tablet Take 40 mg by mouth daily.    Cholecalciferol (VITAMIN D) 2000 units tablet Take 4,000 Units by mouth daily.   clopidogrel (PLAVIX) 75 MG tablet TAKE ONE TABLET BY MOUTH ONCE DAILY WITH BREAKFAST (Patient taking differently: Take 75 mg by mouth daily.)   cyanocobalamin (,VITAMIN B-12,) 1000 MCG/ML injection Inject 1 mL into the skin every 30 (thirty) days.   digoxin (LANOXIN) 0.25 MG tablet Take 0.25 mg by mouth at bedtime.    DULoxetine (CYMBALTA) 60 MG capsule Take 120 mg by mouth daily after breakfast.   empagliflozin (JARDIANCE) 25 MG TABS tablet Take 0.5 tablets by mouth daily.   ferrous sulfate 325 (65 FE) MG EC tablet Take 325 mg by mouth every other day.   finasteride (PROSCAR) 5 MG tablet Take 5 mg by mouth daily.   gabapentin (NEURONTIN) 400 MG capsule Take 400-800 mg by mouth See admin instructions. Take 400 mg by mouth in the morning and take 800 mg by mouth at bedtime   galantamine  (RAZADYNE) 4 MG tablet Take 4 mg by mouth 2 (two) times daily as needed. dementia   glipiZIDE (GLUCOTROL) 10 MG tablet Take 10 mg by mouth 2 (two) times daily.   isosorbide mononitrate (IMDUR) 30 MG 24  hr tablet Take 1 tablet (30 mg total) by mouth daily.   levothyroxine (SYNTHROID) 100 MCG tablet Take 1 tablet by mouth daily.   lisinopril (PRINIVIL,ZESTRIL) 20 MG tablet Take 20 mg by mouth daily.   magnesium oxide (MAG-OX) 400 MG tablet Take 420 mg by mouth daily.   metoprolol tartrate (LOPRESSOR) 50 MG tablet Take 25 mg by mouth 2 (two) times daily.   Multiple Vitamin (MULTIVITAMIN ADULT PO) Take 1 tablet by mouth daily. Unknown strength   omeprazole (PRILOSEC OTC) 20 MG tablet Take 20 mg by mouth at bedtime.   QUEtiapine (SEROQUEL) 100 MG tablet Take 150 mg by mouth at bedtime.    Semaglutide (OZEMPIC, 0.25 OR 0.5 MG/DOSE, [place]) Inject 0.25 mg into the skin once a week.   solifenacin (VESICARE) 5 MG tablet Take 5 mg by mouth daily.    tamsulosin (FLOMAX) 0.4 MG CAPS capsule Take 0.4 mg by mouth at bedtime.   vitamin C (ASCORBIC ACID) 250 MG tablet Take 500 mg by mouth daily.   zinc gluconate 50 MG tablet Take 1 tablet by mouth at bedtime.   [DISCONTINUED] isosorbide mononitrate (IMDUR) 60 MG 24 hr tablet Take 1 tablet (60 mg total) by mouth daily.     Allergies:   Patient has no known allergies.   Social History   Socioeconomic History   Marital status: Married    Spouse name: Not on file   Number of children: Not on file   Years of education: Not on file   Highest education level: Not on file  Occupational History   Not on file  Tobacco Use   Smoking status: Former    Types: Cigarettes    Quit date: 06/18/1994    Years since quitting: 26.5   Smokeless tobacco: Never  Vaping Use   Vaping Use: Never used  Substance and Sexual Activity   Alcohol use: No   Drug use: No   Sexual activity: Not on file  Other Topics Concern   Not on file  Social History Narrative   Not on file    Social Determinants of Health   Financial Resource Strain: Not on file  Food Insecurity: Not on file  Transportation Needs: Not on file  Physical Activity: Not on file  Stress: Not on file  Social Connections: Not on file     Family History: The patient's family history includes Depression in his mother; Early death in his brother, father, maternal grandmother, and maternal uncle; Heart attack in his brother, father, and paternal uncle; Heart disease in his brother and father; Heart failure in his brother and father; Hyperlipidemia in his brother and father; Hypertension in his brother and father; Osteoarthritis in his father; Rheum arthritis in his father; Stroke in his maternal grandmother and maternal uncle. ROS:   Please see the history of present illness.    All 14 point review of systems negative except as described per history of present illness  EKGs/Labs/Other Studies Reviewed:      Recent Labs: No results found for requested labs within last 8760 hours.  Recent Lipid Panel No results found for: CHOL, TRIG, HDL, CHOLHDL, VLDL, LDLCALC, LDLDIRECT  Physical Exam:    VS:  BP (!) 98/52 (BP Location: Left Arm, Patient Position: Sitting)   Pulse 78   Ht 6' (1.829 m)   Wt 268 lb (121.6 kg)   SpO2 97%   BMI 36.35 kg/m     Wt Readings from Last 3 Encounters:  01/08/21 268 lb (121.6 kg)  09/04/20 271 lb (122.9 kg)  08/23/20 277 lb (125.6 kg)     GEN:  Well nourished, well developed in no acute distress HEENT: Normal NECK: No JVD; No carotid bruits LYMPHATICS: No lymphadenopathy CARDIAC: RRR, no murmurs, no rubs, no gallops RESPIRATORY:  Clear to auscultation without rales, wheezing or rhonchi, poor air entry left base ABDOMEN: Soft, non-tender, non-distended MUSCULOSKELETAL:  No edema; No deformity  SKIN: Warm and dry LOWER EXTREMITIES: no swelling NEUROLOGIC:  Alert and oriented x 3 PSYCHIATRIC:  Normal affect   ASSESSMENT:    1. Permanent atrial  fibrillation ([place])   2. Essential hypertension    PLAN:    In order of problems listed above:  Permanent atrial fibrillation rate controlled he is anticoagulated which I will continue. Coronary disease status post coronary bypass graft.  I last time increase dose of his Imdur from 30 mg to 60 mg daily however he does not feel any better on top of that his blood pressure is on the lower side which may contribute to his overall feeling lousy.  I decided to go back to 30 mg of Imdur. Dyslipidemia: He is on Lipitor 80 which I will continue we will make arrangements for fasting lipid profile to be repeated since I do not see any data within the last year or so Essential hypertension: Blood pressure well controlled continue present management. Congestive heart failure: Compensated   Medication Adjustments/Labs and Tests Ordered: Current medicines are reviewed at length with the patient today.  Concerns regarding medicines are outlined above.  Orders Placed This Encounter  Procedures   EKG 12-Lead   Medication changes:  Meds ordered this encounter  Medications   isosorbide mononitrate (IMDUR) 30 MG 24 hr tablet    Sig: Take 1 tablet (30 mg total) by mouth daily.    Dispense:  90 tablet    Refill:  3    Signed, Park Liter, MD, [facility] 01/08/2021 10:56 AM    [place]

## 2021-01-08 NOTE — Patient Instructions (Signed)
Medication Instructions:  Your physician has recommended you make the following change in your medication:  DECREASE: Imdur 30 mg once daily *If you need a refill on your cardiac medications before your next appointment, please call your pharmacy*   Lab Work: None If you have labs (blood work) drawn today and your tests are completely normal, you will receive your results only by: [place] (if you have MyChart) OR A paper copy in the mail If you have any lab test that is abnormal or we need to change your treatment, we will call you to review the results.   Testing/Procedures: None   Follow-Up: At [facility], you and your health needs are our priority.  As part of our continuing mission to provide you with exceptional heart care, we have created designated Provider Care Teams.  These Care Teams include your primary Cardiologist (physician) and Advanced Practice Providers (APPs -  Physician Assistants and Nurse Practitioners) who all work together to provide you with the care you need, when you need it.  We recommend signing up for the patient portal called "MyChart".  Sign up information is provided on this After Visit Summary.  MyChart is used to connect with patients for Virtual Visits (Telemedicine).  Patients are able to view lab/test results, encounter notes, upcoming appointments, etc.  Non-urgent messages can be sent to your provider as well.   To learn more about what you can do with MyChart, go to [email or web address].    Your next appointment:   6 month(s)  The format for your next appointment:   In Person  Provider:   [name], MD   Other Instructions

## 2021-01-25 DIAGNOSIS — F0280 Dementia in other diseases classified elsewhere without behavioral disturbance: Secondary | ICD-10-CM | POA: Insufficient documentation

## 2021-01-25 DIAGNOSIS — J42 Unspecified chronic bronchitis: Secondary | ICD-10-CM | POA: Insufficient documentation

## 2021-09-05 ENCOUNTER — Other Ambulatory Visit: Payer: Self-pay | Admitting: Cardiology

## 2021-10-03 ENCOUNTER — Encounter: Payer: Self-pay | Admitting: Cardiology

## 2021-10-03 ENCOUNTER — Ambulatory Visit: Payer: Medicare Other | Admitting: Cardiology

## 2021-10-03 VITALS — BP 98/68 | HR 61 | Ht 72.0 in | Wt 256.2 lb

## 2021-10-03 DIAGNOSIS — I10 Essential (primary) hypertension: Secondary | ICD-10-CM | POA: Diagnosis not present

## 2021-10-03 DIAGNOSIS — G4733 Obstructive sleep apnea (adult) (pediatric): Secondary | ICD-10-CM | POA: Diagnosis not present

## 2021-10-03 DIAGNOSIS — G20A1 Parkinson's disease without dyskinesia, without mention of fluctuations: Secondary | ICD-10-CM

## 2021-10-03 DIAGNOSIS — G20 Parkinson's disease: Secondary | ICD-10-CM

## 2021-10-03 DIAGNOSIS — I4821 Permanent atrial fibrillation: Secondary | ICD-10-CM | POA: Diagnosis not present

## 2021-10-03 DIAGNOSIS — I25119 Atherosclerotic heart disease of native coronary artery with unspecified angina pectoris: Secondary | ICD-10-CM

## 2021-10-03 DIAGNOSIS — R2689 Other abnormalities of gait and mobility: Secondary | ICD-10-CM | POA: Insufficient documentation

## 2021-10-03 DIAGNOSIS — C3492 Malignant neoplasm of unspecified part of left bronchus or lung: Secondary | ICD-10-CM

## 2021-10-03 NOTE — Progress Notes (Signed)
?Cardiology Office Note:   ? ?Date:  10/03/2021  ? ?ID:  Travis Watts, DOB 1945-10-03, MRN [id number] ? ?PCP:  [name], PA-C  ?Cardiologist:  [name], MD   ? ?Referring MD: [name], *  ? ?Chief Complaint  ?Patient presents with  ? Follow-up  ?I am doing fine ? ?History of Present Illness:   ? ?Travis Watts is a 76 y.o. male   with very complex past medical history that includes coronary artery disease, status post coronary artery bypass graft years ago, permanent atrial fibrillation, history of lung resection secondary to lung cancer, diabetes, essential hypertension, dyslipidemia, Parkinson.   ?He comes today to my office for follow-up overall he seems to be doing quite well.  He is very optimistic usually he is very sad does not want to talk much but dates that in contrary sadly his wife was with him in the office all the time develop breast cancer she did have mastectomy done as well as chemotherapy and now radiation therapy because of this she has to do less at home Travis Watts is doing much more at home that make him feel better.  Denies have any chest pain tightness squeezing pressure burning chest no palpitations dizziness swelling of lower extremities. ? ?Past Medical History:  ?Diagnosis Date  ? A-fib ([place])   ? Abnormal nuclear stress test 06/12/2016  ? Formatting of this note might be different from the original. Added automatically from request for surgery [id number]  ? Acquired hypothyroidism 06/05/2016  ? Last Assessment & Plan:  Formatting of this note might be different from the original. [place] does labs and they will send copies never  ? Anxiety   ? Anxiety state 06/05/2016  ? Arthritis   ? previous to joint replacement - knees & shoulder.  Pt.'s R foot fused, traumatized from exposure to agent ORANGE  ? Atherosclerosis of native coronary artery of native heart with angina pectoris ([place]) 06/12/2016  ? Overview:  Added automatically from request for surgery [id number]  ?  Atrial fibrillation ([place]) 04/25/2015  ? Overview:  Chads score of 4, anticoagulated with warfarin  Formatting of this note might be different from the original. Advised to go to 2 daily and recheck 10 days  Last Assessment & Plan:  Formatting of this note might be different from the original. On the Eliquis now and denies adverse issues Formatting of this note might be different from the original. Overview:  Chads score of 4, anticoagul  ? Benign prostatic hyperplasia with urinary frequency 08/22/2020  ? Last Assessment & Plan:  Formatting of this note might be different from the original. Suspect most likely Doubt florinef but ?? As steroid and could stop as was intitially started for hypotension and that is not longer the case On very low dose at 0.1 and see what happens with stopping that Already on flomax and hesitant to do a bunch of meds and urology referral until his heart situation is sett  ? Body mass index (BMI) 40.0-44.9, adult ([place]) 03/11/2019  ? Callus 08/29/2020  ? Cancer [facility])   ? lung  ? CHF (congestive heart failure) ([place])   ? Chronic anticoagulation 07/27/2018  ? Last Assessment & Plan:  Formatting of this note might be different from the original. Is on the eliquis and no concerns GFR > 60  ? Chronic atrial fibrillation ([place]) 04/25/2015  ? Overview:  Chads score of 4, anticoagulated with warfarin  Formatting of this note might be different from  the original. Advised to go to 2 daily and recheck 10 days  Last Assessment & Plan:  Formatting of this note might be different from the original. On the Eliquis now and denies adverse issues Formatting of this note might be different from the original. Overview:  Chads score of 4, anticoagul  ? Chronic diastolic congestive heart failure ([place]) 03/05/2018  ? Last Assessment & Plan:  Formatting of this note might be different from the original. Sees cardio and is UTD and feels well overall  ? Class 2 severe obesity due to excess calories with serious comorbidity in  adult [facility]) 03/05/2018  ? Last Assessment & Plan:  Formatting of this note might be different from the original. Is down 7 pounds  ( on purpose ) and is working on it  ? Coronary artery disease   ? Coronary artery disease involving native coronary artery of native heart 09/07/2015  ? Overview:  Left main coronary artery stenting known in March 2017 Cardiac catheterization December 2017 showed patent stent  ? Coronary atherosclerosis of native coronary artery 09/07/2015  ? Formatting of this note might be different from the original. Overview:  Left main coronary artery stenting known in March 2017 Cardiac catheterization December 2017 showed patent stent  Overview:  Added automatically from request for surgery [id number] Formatting of this note might be different from the original. Left main coronary artery stenting known in March 2017 Cardiac catheterization December  ? Dementia without behavioral disturbance ([place]) 11/30/2018  ? Formatting of this note might be different from the original. Could be sequlae of the vesicare and try to stop it Also, mouth is very dry  F/U sxs p/w  Last Assessment & Plan:  Formatting of this note might be different from the original. He is doing about as expected Some loss and likel a component of Pd as well and polypharmacy already an issue  [facility] is his primary  ? Depression   ? Diabetes mellitus without complication ([place])   ? diagnosed- 72yrs.   ? Diabetic neuropathy ([place]) 08/29/2020  ? Dizziness 10/19/2017  ? Dyspnea   ? Dyspnea on exertion 07/27/2018  ? Last Assessment & Plan:  Formatting of this note might be different from the original. Follwing cardio Seen and had tests and has F/U  ? Dysrhythmia   ? Dystrophic nail 08/29/2020  ? Episode of recurrent major depressive disorder ([place]) 11/30/2018  ? Formatting of this note might be different from the original. Doing better with the Abilify Per wife  Will continue on and 6 weeks f/u  Last Assessment & Plan:  Formatting of this note might be different  from the original. No changes and on SNRI and atypical  ? Essential (primary) hypertension 04/25/2015  ? Last Assessment & Plan:  Formatting of this note might be different from the original. Perfect and has had some dose reduction  ? Essential hypertension 04/25/2015  ? Last Assessment & Plan:  Formatting of this note might be different from the original. Perfect and has had some dose reduction  ? Ex-smoker 08/20/2015  ? GERD (gastroesophageal reflux disease)   ? Hip pain 08/01/2019  ? History of pneumonectomy 03/17/2018  ? Hypercholesterolemia 06/05/2016  ? Hyperlipidemia   ? Hypertension   ? Hypomagnesemia 08/29/2020  ? Malignant neoplasm of bronchus and lung ([place]) 08/29/2020  ? Mar 16, 2012 Entered By: Sydnee Cabal Comment: getting surgery locally  ? Memory difficulty 07/18/2020  ? Mixed hyperlipidemia 04/25/2015  ? Myofascial pain 04/11/2019  ? Neuromuscular disorder (  HCC)   ? neuropathy- feet & legs  ? OSA (obstructive sleep apnea) 07/11/2016  ? Other secondary scoliosis, site unspecified 03/11/2019  ? Other testicular hypofunction 08/29/2020  ? Parkinson disease ([place]) 6 weeks ago  ? Parkinson's disease ([place]) 05/22/2020  ? Pneumonia   ? several times, but treated from his home, not hosp.   ? Pre-operative clearance 06/17/2017  ? Primary malignant neoplasm of left lung ([place]) 01/13/2017  ? Rectal cancer ([place]) 03/17/2017  ? Sacroiliitis ([place]) 04/11/2019  ? Shortness of breath 10/07/2017  ? Sleep apnea   ? not able to CPAP, since lung beiing removed- wife reports no problem.   ? Small cell carcinoma of left lung ([place]) 04/25/2015  ? Spinal stenosis of lumbar region 06/03/2017  ? Spondylolisthesis of lumbar region 06/25/2017  ? Stroke (cerebrum) ([place])   ? as a 76 y.o.   ? Suicide and self-inflicted injury by hanging, strangulation, and suffocation 08/29/2020  ? Type 2 diabetes mellitus with diabetic polyneuropathy, without long-term current use of insulin ([place]) 03/05/2018  ? diagnosed- 58yrs.  Last Assessment & Plan:  Formatting of this  note might be different from the original. Excellent control Would check every 6 months--3 months ago was 7  ? Type 2 diabetes mellitus without complication ([place]) 07/30/3783  ? Venous (peripheral) insufficiency

## 2021-10-03 NOTE — Patient Instructions (Signed)
Medication Instructions:  ?Your physician recommends that you continue on your current medications as directed. Please refer to the Current Medication list given to you today.  ?*If you need a refill on your cardiac medications before your next appointment, please call your pharmacy* ? ? ?Lab Work: ?LDL Direct Today ?If you have labs (blood work) drawn today and your tests are completely normal, you will receive your results only by: ?MyChart Message (if you have MyChart) OR ?A paper copy in the mail ?If you have any lab test that is abnormal or we need to change your treatment, we will call you to review the results. ? ? ?Testing/Procedures: ?None Ordered ? ? ?Follow-Up: ?At [facility], you and your health needs are our priority.  As part of our continuing mission to provide you with exceptional heart care, we have created designated Provider Care Teams.  These Care Teams include your primary Cardiologist (physician) and Advanced Practice Providers (APPs -  Physician Assistants and Nurse Practitioners) who all work together to provide you with the care you need, when you need it. ? ?We recommend signing up for the patient portal called "MyChart".  Sign up information is provided on this After Visit Summary.  MyChart is used to connect with patients for Virtual Visits (Telemedicine).  Patients are able to view lab/test results, encounter notes, upcoming appointments, etc.  Non-urgent messages can be sent to your provider as well.   ?To learn more about what you can do with MyChart, go to [email or web address].   ? ?Your next appointment:   ?6 month(s) ? ?The format for your next appointment:   ?In Person ? ?Provider:   ?[name], MD  ? ? ?Other Instructions ?NA  ?

## 2021-10-04 LAB — LDL CHOLESTEROL, DIRECT: LDL Direct: 100 mg/dL — ABNORMAL HIGH (ref 0–99)

## 2021-10-08 ENCOUNTER — Telehealth: Payer: Self-pay

## 2021-10-08 DIAGNOSIS — I25119 Atherosclerotic heart disease of native coronary artery with unspecified angina pectoris: Secondary | ICD-10-CM

## 2021-10-08 DIAGNOSIS — E782 Mixed hyperlipidemia: Secondary | ICD-10-CM

## 2021-10-08 MED ORDER — EZETIMIBE 10 MG PO TABS: 10.0000 mg | ORAL_TABLET | Freq: Every day | ORAL | 0 refills | Status: DC

## 2021-10-08 NOTE — Telephone Encounter (Signed)
-----   Message from Park Liter, MD sent at 10/04/2021  1:10 PM EDT ----- ?Cholesterol still elevated and we recommend to start Zetia 10 mg daily on top of statin, if he agrees 6 weeks later fasting lipid profile, AST ALT ?

## 2021-10-08 NOTE — Telephone Encounter (Signed)
Results reviewed with pt as per Dr. Wendy Poet note. Spoke with Travis Watts- per DPR- Agreed to add Zetia 10mg  q d, sent to pharmacy. Lipids, AST, ALT in 6 weeks. Lab req sent to lab. Pt verbalized understanding and had no additional questions. ?Routed to PCP ?

## 2021-12-16 ENCOUNTER — Other Ambulatory Visit: Payer: Self-pay | Admitting: Cardiology

## 2022-02-26 ENCOUNTER — Telehealth: Payer: Self-pay | Admitting: Cardiology

## 2022-02-26 NOTE — Telephone Encounter (Signed)
   Pre-operative Risk Assessment    Patient Name: Travis Watts  DOB: 1945/12/12 MRN: [id number]      Request for Surgical Clearance    Procedure:   Right Shoulder Replacement  Date of Surgery:  Clearance TBD                                 Surgeon:  Dr. Joya Salm Surgeon's Group or Practice Name:  [facility] Phone number:  [phone] Fax number:  [phone]   Type of Clearance Requested:   - Pharmacy:  Hold Apixaban (Eliquis)     Type of Anesthesia:  General    Additional requests/questions:  Please advise surgeon/provider what medications should be held.  Signed, Belisicia T Harris   02/26/2022, 12:42 PM

## 2022-03-01 NOTE — Telephone Encounter (Signed)
Patient with diagnosis of atrial fibrillation on Eliquis for anticoagulation.    Procedure: right shoulder replacement Date of procedure: TBD   CHA2DS2-VASc Score = 8   This indicates a 10.8% annual risk of stroke. The patient's score is based upon: CHF History: 1 HTN History: 1 Diabetes History: 1 Stroke History: 2 Vascular Disease History: 1 Age Score: 2 Gender Score: 0   Chart indicates stroke occurred when pt 76 yo  No labs on file in past year or more, will need BMET, CBC for clearnace    **This guidance is not considered finalized until pre-operative APP has relayed final recommendations.**

## 2022-03-10 NOTE — Telephone Encounter (Signed)
Caller is following-up on the status of this medical clearance.

## 2022-03-12 NOTE — Telephone Encounter (Signed)
Patient with diagnosis of atrial fibrillation on Eliquis for anticoagulation.     Procedure: right shoulder replacement Date of procedure: TBD     CHA2DS2-VASc Score = 8   This indicates a 10.8% annual risk of stroke. The patient's score is based upon: CHF History: 1 HTN History: 1 Diabetes History: 1 Stroke History: 2 Vascular Disease History: 1 Age Score: 2 Gender Score: 0   Chart indicates stroke occurred when pt 76 yo  CrCl:  100 mL/min Plts: 315K  Typically prefer to hold Eliquis for 3 days. Due to history of stroke and elevated risk score, will route to Dr Agustin Cree for input      **This guidance is not considered finalized until pre-operative APP has relayed final recommendations.**

## 2022-03-12 NOTE — Telephone Encounter (Addendum)
   Patient Name: Travis Watts  DOB: August 23, 1945 MRN: [id number]  Primary Cardiologist: [name], MD  Chart reviewed as part of pre-operative protocol coverage.  As noted below, pharmacist is awaiting input on holding anticoagulation as outlined below - already routed to Dr. Agustin Cree below. Once we hear back from MD, our team will send to callback nurse to arrange virtual visit. Will route this update to requesting party so they are aware of this plan.   Charlie Pitter, PA-C 03/12/2022, 2:43 PM

## 2022-03-18 NOTE — Telephone Encounter (Signed)
Needs virtual visit or clearance  Or  May do in office visit with Dr. Agustin Cree (if there is availability) as he is due for his 6 mos f/u 03/2022 which could be completed early.   [name], NP

## 2022-03-18 NOTE — Telephone Encounter (Signed)
Will send message to [place] office to see if they may schedule appt in office as pt is due 03/2022 for his 6 mnth f/u per Laurann [place], NP pre op provider today.

## 2022-03-20 DIAGNOSIS — Z658 Other specified problems related to psychosocial circumstances: Secondary | ICD-10-CM | POA: Insufficient documentation

## 2022-03-20 NOTE — Telephone Encounter (Signed)
   Patient Name: Travis Watts  DOB: [birth year] MRN: [id number]  Primary Cardiologist: [name], MD  Clinical pharmacists have reviewed the patient's past medical history, labs, and current medications as part of preoperative protocol coverage. The following recommendations have been made:   Per Dr Agustin Cree, patient may hold Eliquis for 2 days prior to procedure. Please resume Eliquis as soon as possible postprocedure, at the discretion of the surgeon.    I will route this recommendation to the requesting party via Epic fax function and remove from pre-op pool.  Please call with questions.  Lenna Sciara, NP 03/20/2022, 8:53 AM

## 2022-03-20 NOTE — Telephone Encounter (Signed)
Per Dr Agustin Cree, patient may hold Eliquis for 2 days

## 2022-03-24 ENCOUNTER — Ambulatory Visit: Payer: Medicare Other | Admitting: Cardiology

## 2022-04-15 ENCOUNTER — Ambulatory Visit: Payer: Medicare Other | Attending: Cardiology | Admitting: Cardiology

## 2022-04-15 ENCOUNTER — Telehealth: Payer: Self-pay | Admitting: *Deleted

## 2022-04-15 ENCOUNTER — Encounter: Payer: Self-pay | Admitting: Cardiology

## 2022-04-15 VITALS — BP 122/64 | HR 76 | Ht 72.0 in | Wt 253.0 lb

## 2022-04-15 DIAGNOSIS — E1142 Type 2 diabetes mellitus with diabetic polyneuropathy: Secondary | ICD-10-CM

## 2022-04-15 DIAGNOSIS — M19011 Primary osteoarthritis, right shoulder: Secondary | ICD-10-CM | POA: Insufficient documentation

## 2022-04-15 DIAGNOSIS — I25119 Atherosclerotic heart disease of native coronary artery with unspecified angina pectoris: Secondary | ICD-10-CM

## 2022-04-15 DIAGNOSIS — I5032 Chronic diastolic (congestive) heart failure: Secondary | ICD-10-CM

## 2022-04-15 DIAGNOSIS — Z01810 Encounter for preprocedural cardiovascular examination: Secondary | ICD-10-CM

## 2022-04-15 DIAGNOSIS — R0609 Other forms of dyspnea: Secondary | ICD-10-CM

## 2022-04-15 DIAGNOSIS — I4821 Permanent atrial fibrillation: Secondary | ICD-10-CM

## 2022-04-15 DIAGNOSIS — C3492 Malignant neoplasm of unspecified part of left bronchus or lung: Secondary | ICD-10-CM

## 2022-04-15 NOTE — Telephone Encounter (Signed)
Patient's daughter per DPR was given detailed instructions per Myocardial Perfusion Study Information Sheet for the test on 04/17/22 at 0745. Patient notified to arrive 15 minutes early and that it is imperative to arrive on time for appointment to keep from having the test rescheduled.  If you need to cancel or reschedule your appointment, please call the office within 24 hours of your appointment. . Patient verbalized understanding.Rahsaan Weakland, [place]e [place]

## 2022-04-15 NOTE — Patient Instructions (Signed)
Medication Instructions:  Your physician recommends that you continue on your current medications as directed. Please refer to the Current Medication list given to you today.  *If you need a refill on your cardiac medications before your next appointment, please call your pharmacy*   Lab Work: None ordered If you have labs (blood work) drawn today and your tests are completely normal, you will receive your results only by: [facility] (if you have MyChart) OR A paper copy in the mail If you have any lab test that is abnormal or we need to change your treatment, we will call you to review the results.   Testing/Procedures: Your physician has requested that you have a lexiscan myoview. For further information please visit [email or web address]. Please follow instruction sheet, as given.  The test will take approximately 3 to 4 hours to complete; you may bring reading material.  If someone comes with you to your appointment, they will need to remain in the main lobby due to limited space in the testing area.    How to prepare for your Myocardial Perfusion Test: Do not eat or drink 3 hours prior to your test, except you may have water. Do not consume products containing caffeine (regular or decaffeinated) 12 hours prior to your test. (ex: coffee, chocolate, sodas, tea). Do bring a list of your current medications with you.  If not listed below, you may take your medications as normal. Do wear comfortable clothes (no dresses or overalls) and walking shoes, tennis shoes preferred (No heels or open toe shoes are allowed). Do NOT wear cologne, perfume, aftershave, or lotions (deodorant is allowed). If these instructions are not followed, your test will have to be rescheduled.  Your physician has requested that you have an echocardiogram. Echocardiography is a painless test that uses sound waves to create images of your heart. It provides your doctor with information about the size and shape of  your heart and how well your heart's chambers and valves are working. This procedure takes approximately one hour. There are no restrictions for this procedure.   Follow-Up: At [facility], you and your health needs are our priority.  As part of our continuing mission to provide you with exceptional heart care, we have created designated Provider Care Teams.  These Care Teams include your primary Cardiologist (physician) and Advanced Practice Providers (APPs -  Physician Assistants and Nurse Practitioners) who all work together to provide you with the care you need, when you need it.  We recommend signing up for the patient portal called "MyChart".  Sign up information is provided on this After Visit Summary.  MyChart is used to connect with patients for Virtual Visits (Telemedicine).  Patients are able to view lab/test results, encounter notes, upcoming appointments, etc.  Non-urgent messages can be sent to your provider as well.   To learn more about what you can do with MyChart, go to [email or web address].    Your next appointment:   6 month(s)  The format for your next appointment:   In Person  Provider:   [name], MD   Other Instructions Cardiac Nuclear Scan A cardiac nuclear scan is a test that is done to check the flow of blood to your heart. It is done when you are resting and when you are exercising. The test looks for problems such as: Not enough blood reaching a portion of the heart. The heart muscle not working as it should. You may need this test if: You have heart disease. You  have had lab results that are not normal. You have had heart surgery or a balloon procedure to open up blocked arteries (angioplasty). You have chest pain. You have shortness of breath. In this test, a special dye (tracer) is put into your bloodstream. The tracer will travel to your heart. A camera will then take pictures of your heart to see how the tracer moves through your heart.  This test is usually done at a hospital and takes 2-4 hours. Tell a doctor about: Any allergies you have. All medicines you are taking, including vitamins, herbs, eye drops, creams, and over-the-counter medicines. Any problems you or family members have had with anesthetic medicines. Any blood disorders you have. Any surgeries you have had. Any medical conditions you have. Whether you are pregnant or may be pregnant. What are the risks? Generally, this is a safe test. However, problems may occur, such as: Serious chest pain and heart attack. This is only a risk if the stress portion of the test is done. Rapid heartbeat. A feeling of warmth in your chest. This feeling usually does not last long. Allergic reaction to the tracer. What happens before the test? Ask your doctor about changing or stopping your normal medicines. This is important. Follow instructions from your doctor about what you cannot eat or drink. Remove your jewelry on the day of the test. What happens during the test? An IV tube will be inserted into one of your veins. Your doctor will give you a small amount of tracer through the IV tube. You will wait for 20-40 minutes while the tracer moves through your bloodstream. Your heart will be monitored with an electrocardiogram (ECG). You will lie down on an exam table. Pictures of your heart will be taken for about 15-20 minutes. You may also have a stress test. For this test, one of these things may be done: You will be asked to exercise on a treadmill or a stationary bike. You will be given medicines that will make your heart work harder. This is done if you are unable to exercise. When blood flow to your heart has peaked, a tracer will again be given through the IV tube. After 20-40 minutes, you will get back on the exam table. More pictures will be taken of your heart. Depending on the tracer that is used, more pictures may need to be taken 3-4 hours later. Your IV  tube will be removed when the test is over. The test may vary among doctors and hospitals. What happens after the test? Ask your doctor: Whether you can return to your normal schedule, including diet, activities, and medicines. Whether you should drink more fluids. This will help to remove the tracer from your body. Drink enough fluid to keep your pee (urine) pale yellow. Ask your doctor, or the department that is doing the test: When will my results be ready? How will I get my results? Summary A cardiac nuclear scan is a test that is done to check the flow of blood to your heart. Tell your doctor whether you are pregnant or may be pregnant. Before the test, ask your doctor about changing or stopping your normal medicines. This is important. Ask your doctor whether you can return to your normal activities. You may be asked to drink more fluids. This information is not intended to replace advice given to you by your health care provider. Make sure you discuss any questions you have with your health care provider. Document Revised: 09/29/2018 Document Reviewed:  11/23/2017 Elsevier Patient Education  2021 [employer].    Echocardiogram An echocardiogram is a test that uses sound waves (ultrasound) to produce images of the heart. Images from an echocardiogram can provide important information about: Heart size and shape. The size and thickness and movement of your heart's walls. Heart muscle function and strength. Heart valve function or if you have stenosis. Stenosis is when the heart valves are too narrow. If blood is flowing backward through the heart valves (regurgitation). A tumor or infectious growth around the heart valves. Areas of heart muscle that are not working well because of poor blood flow or injury from a heart attack. Aneurysm detection. An aneurysm is a weak or damaged part of an artery wall. The wall bulges out from the normal force of blood pumping through the  body. Tell a health care provider about: Any allergies you have. All medicines you are taking, including vitamins, herbs, eye drops, creams, and over-the-counter medicines. Any blood disorders you have. Any surgeries you have had. Any medical conditions you have. Whether you are pregnant or may be pregnant. What are the risks? Generally, this is a safe test. However, problems may occur, including an allergic reaction to dye (contrast) that may be used during the test. What happens before the test? No specific preparation is needed. You may eat and drink normally. What happens during the test? You will take off your clothes from the waist up and put on a hospital gown. Electrodes or electrocardiogram (ECG)patches may be placed on your chest. The electrodes or patches are then connected to a device that monitors your heart rate and rhythm. You will lie down on a table for an ultrasound exam. A gel will be applied to your chest to help sound waves pass through your skin. A handheld device, called a transducer, will be pressed against your chest and moved over your heart. The transducer produces sound waves that travel to your heart and bounce back (or "echo" back) to the transducer. These sound waves will be captured in real-time and changed into images of your heart that can be viewed on a video monitor. The images will be recorded on a computer and reviewed by your health care provider. You may be asked to change positions or hold your breath for a short time. This makes it easier to get different views or better views of your heart. In some cases, you may receive contrast through an IV in one of your veins. This can improve the quality of the pictures from your heart. The procedure may vary among health care providers and hospitals.    What can I expect after the test? You may return to your normal, everyday life, including diet, activities, and medicines, unless your health care provider tells  you not to do that. Follow these instructions at home: It is up to you to get the results of your test. Ask your health care provider, or the department that is doing the test, when your results will be ready. Keep all follow-up visits. This is important. Summary An echocardiogram is a test that uses sound waves (ultrasound) to produce images of the heart. Images from an echocardiogram can provide important information about the size and shape of your heart, heart muscle function, heart valve function, and other possible heart problems. You do not need to do anything to prepare before this test. You may eat and drink normally. After the echocardiogram is completed, you may return to your normal, everyday life, unless  your health care provider tells you not to do that. This information is not intended to replace advice given to you by your health care provider. Make sure you discuss any questions you have with your health care provider. Document Revised: 01/31/2020 Document Reviewed: 01/31/2020 Elsevier Patient Education  2021 [employer].

## 2022-04-15 NOTE — Progress Notes (Signed)
Cardiology Office Note:    Date:  04/15/2022   ID:  Travis Watts, DOB [birth year], MRN [id number]  PCP:  [name], PA-C  Cardiologist:  [name], MD    Referring MD: [name], *   Chief Complaint  Patient presents with   Clearance TBD    Dr Samule Dry R shoulder replacement    History of Present Illness:    Travis Watts is a 76 y.o. male with complex past medical history which include coronary artery disease, status post coronary bypass graft years ago, permanent atrial fibrillation, anticoagulated, history of lung resection secondary to lung cancer, diabetes, essential hypertension, dyslipidemia, Parkinson. Comes today to my office for follow-up.  He required right shoulder surgery he is here for evaluation before that surgery.  Overall he is doing fine.  He denies of any chest pain, tightness, pressure, burning in the chest but he does admit that he does not do much last time I seen him in April his wife was sick with breast cancer and he had to do a lot he was very happy to and now his wife is back doing quite well and she is doing majority of them at home.  He denies have any cardiac complaints there is no chest pain tightness squeezing pressure burning chest.  Past Medical History:  Diagnosis Date   A-fib [facility])    Abnormal nuclear stress test 06/12/2016   Formatting of this note might be different from the original. Added automatically from request for surgery [id number]   Acquired hypothyroidism 06/05/2016   Last Assessment & Plan:  Formatting of this note might be different from the original. [place] does labs and they will send copies never   Anxiety    Anxiety state 06/05/2016   Arthritis    previous to joint replacement - knees & shoulder.  Pt.'s R foot fused, traumatized from exposure to agent ORANGE   Atherosclerosis of native coronary artery of native heart with angina pectoris ([place]) 06/12/2016   Overview:  Added automatically from request  for surgery [id number]   Atrial fibrillation ([place]) 04/25/2015   Overview:  Chads score of 4, anticoagulated with warfarin  Formatting of this note might be different from the original. Advised to go to 2 daily and recheck 10 days  Last Assessment & Plan:  Formatting of this note might be different from the original. On the Eliquis now and denies adverse issues Formatting of this note might be different from the original. Overview:  Chads score of 4, anticoagul   Benign prostatic hyperplasia with urinary frequency 08/22/2020   Last Assessment & Plan:  Formatting of this note might be different from the original. Suspect most likely Doubt florinef but ?? As steroid and could stop as was intitially started for hypotension and that is not longer the case On very low dose at 0.1 and see what happens with stopping that Already on flomax and hesitant to do a bunch of meds and urology referral until his heart situation is sett   Body mass index (BMI) 40.0-44.9, adult ([place]) 03/11/2019   Callus 08/29/2020   Cancer ([place])    lung   CHF (congestive heart failure) ([place])    Chronic anticoagulation 07/27/2018   Last Assessment & Plan:  Formatting of this note might be different from the original. Is on the eliquis and no concerns GFR > 60   Chronic atrial fibrillation ([place]) 04/25/2015   Overview:  Chads score of 4, anticoagulated with warfarin  Formatting  of this note might be different from the original. Advised to go to 2 daily and recheck 10 days  Last Assessment & Plan:  Formatting of this note might be different from the original. On the Eliquis now and denies adverse issues Formatting of this note might be different from the original. Overview:  Chads score of 4, anticoagul   Chronic diastolic congestive heart failure ([place]) 03/05/2018   Last Assessment & Plan:  Formatting of this note might be different from the original. Sees cardio and is UTD and feels well overall   Class 2 severe obesity due to excess calories with  serious comorbidity in adult [facility]) 03/05/2018   Last Assessment & Plan:  Formatting of this note might be different from the original. Is down 7 pounds  ( on purpose ) and is working on it   Coronary artery disease    Coronary artery disease involving native coronary artery of native heart 09/07/2015   Overview:  Left main coronary artery stenting known in March 2017 Cardiac catheterization December 2017 showed patent stent   Coronary atherosclerosis of native coronary artery 09/07/2015   Formatting of this note might be different from the original. Overview:  Left main coronary artery stenting known in March 2017 Cardiac catheterization December 2017 showed patent stent  Overview:  Added automatically from request for surgery [id number] Formatting of this note might be different from the original. Left main coronary artery stenting known in March 2017 Cardiac catheterization December   Dementia without behavioral disturbance ([place]) 11/30/2018   Formatting of this note might be different from the original. Could be sequlae of the vesicare and try to stop it Also, mouth is very dry  F/U sxs p/w  Last Assessment & Plan:  Formatting of this note might be different from the original. He is doing about as expected Some loss and likel a component of Pd as well and polypharmacy already an issue  [facility] is his [facility]   Depression    Diabetes mellitus without complication ([place])    diagnosed- 43yrs.    Diabetic neuropathy ([place]) 08/29/2020   Dizziness 10/19/2017   Dyspnea    Dyspnea on exertion 07/27/2018   Last Assessment & Plan:  Formatting of this note might be different from the original. Follwing cardio Seen and had tests and has F/U   Dysrhythmia    Dystrophic nail 08/29/2020   Episode of recurrent major depressive disorder ([place]) 11/30/2018   Formatting of this note might be different from the original. Doing better with the Abilify Per wife  Will continue on and 6 weeks f/u  Last Assessment & Plan:  Formatting of this  note might be different from the original. No changes and on SNRI and atypical   Essential (primary) hypertension 04/25/2015   Last Assessment & Plan:  Formatting of this note might be different from the original. Perfect and has had some dose reduction   Essential hypertension 04/25/2015   Last Assessment & Plan:  Formatting of this note might be different from the original. Perfect and has had some dose reduction   Ex-smoker 08/20/2015   GERD (gastroesophageal reflux disease)    Hip pain 08/01/2019   History of pneumonectomy 03/17/2018   Hypercholesterolemia 06/05/2016   Hyperlipidemia    Hypertension    Hypomagnesemia 08/29/2020   Malignant neoplasm of bronchus and lung ([place]) 08/29/2020   Mar 16, 2012 Entered By: Sydnee Cabal Comment: getting surgery locally   Memory difficulty 07/18/2020   Mixed hyperlipidemia 04/25/2015  Myofascial pain 04/11/2019   Neuromuscular disorder (HCC)    neuropathy- feet & legs   OSA (obstructive sleep apnea) 07/11/2016   Other secondary scoliosis, site unspecified 03/11/2019   Other testicular hypofunction 08/29/2020   Parkinson disease 6 weeks ago   Parkinson's disease 05/22/2020   Pneumonia    several times, but treated from his home, not hosp.    Pre-operative clearance 06/17/2017   Primary malignant neoplasm of left lung ([place]) 01/13/2017   Rectal cancer ([place]) 03/17/2017   Sacroiliitis ([place]) 04/11/2019   Shortness of breath 10/07/2017   Sleep apnea    not able to CPAP, since lung beiing removed- wife reports no problem.    Small cell carcinoma of left lung ([place]) 04/25/2015   Spinal stenosis of lumbar region 06/03/2017   Spondylolisthesis of lumbar region 06/25/2017   Stroke (cerebrum) [facility])    as a 76 y.o.    Suicide and self-inflicted injury by hanging, strangulation, and suffocation 08/29/2020   Type 2 diabetes mellitus with diabetic polyneuropathy, without long-term current use of insulin ([place]) 03/05/2018   diagnosed- 14yrs.  Last Assessment & Plan:  Formatting  of this note might be different from the original. Excellent control Would check every 6 months--3 months ago was 7   Type 2 diabetes mellitus without complication ([place]) 78/02/3809   Venous (peripheral) insufficiency 08/29/2020    Past Surgical History:  Procedure Laterality Date   ANKLE FUSION     CARDIAC CATHETERIZATION     EYE SURGERY Left    cataract removed    JOINT REPLACEMENT Left 2016   shoulder replacement    LUNG LOBECTOMY Left 2016   done at [place]      Current Medications: Current Meds  Medication Sig   albuterol (PROVENTIL) (2.5 MG/3ML) 0.083% nebulizer solution Inhale 2.5 mg into the lungs as needed for wheezing.   Alogliptin Benzoate 25 MG TABS Take 1 tablet by mouth daily. In the morning'   apixaban (ELIQUIS) 5 MG TABS tablet Take 5 mg by mouth 2 (two) times daily.   ARIPiprazole (ABILIFY) 5 MG tablet Take 1 tablet by mouth daily.   ascorbic acid (VITAMIN C) 500 MG tablet Take 500 mg by mouth daily.   aspirin 81 MG chewable tablet Chew 81 mg by mouth daily.   atorvastatin (LIPITOR) 80 MG tablet Take 40 mg by mouth daily.    Cholecalciferol (VITAMIN D) 2000 units tablet Take 4,000 Units by mouth daily.   cyanocobalamin (,VITAMIN B-12,) 1000 MCG/ML injection Inject 1 mL into the skin every 30 (thirty) days.   digoxin (LANOXIN) 0.25 MG tablet Take 0.25 mg by mouth at bedtime.    DULoxetine (CYMBALTA) 60 MG capsule Take 120 mg by mouth daily after breakfast.   empagliflozin (JARDIANCE) 25 MG TABS tablet Take 0.5 tablets by mouth daily.   ferrous sulfate 325 (65 FE) MG EC tablet Take 325 mg by mouth every other day.   finasteride (PROSCAR) 5 MG tablet Take 5 mg by mouth daily.   gabapentin (NEURONTIN) 400 MG capsule Take 400-800 mg by mouth See admin instructions. Take 400 mg by mouth in the morning and take 800 mg by mouth at bedtime   galantamine (RAZADYNE) 4 MG tablet Take 4 mg by mouth 2 (two) times  daily as needed (dementia). dementia   glipiZIDE (GLUCOTROL) 10 MG tablet Take 10 mg by mouth 2 (two) times daily.   isosorbide mononitrate (IMDUR)  30 MG 24 hr tablet Take 1 tablet (30 mg total) by mouth daily.   levothyroxine (SYNTHROID) 100 MCG tablet Take 1 tablet by mouth daily.   lisinopril (PRINIVIL,ZESTRIL) 20 MG tablet Take 20 mg by mouth daily.   magnesium oxide (MAG-OX) 400 MG tablet Take 420 mg by mouth daily.   metoprolol tartrate (LOPRESSOR) 50 MG tablet Take 25 mg by mouth 2 (two) times daily.   Multiple Vitamin (MULTIVITAMIN ADULT PO) Take 1 tablet by mouth daily. Unknown strength   omeprazole (PRILOSEC OTC) 20 MG tablet Take 20 mg by mouth at bedtime.   QUEtiapine (SEROQUEL) 100 MG tablet Take 150 mg by mouth at bedtime.    Semaglutide (OZEMPIC, 0.25 OR 0.5 MG/DOSE, [place]) Inject 0.25 mg into the skin once a week.   solifenacin (VESICARE) 5 MG tablet Take 5 mg by mouth daily.    tamsulosin (FLOMAX) 0.4 MG CAPS capsule Take 0.4 mg by mouth at bedtime.   vitamin C (ASCORBIC ACID) 250 MG tablet Take 500 mg by mouth daily.   zinc gluconate 50 MG tablet Take 1 tablet by mouth at bedtime.   [DISCONTINUED] clopidogrel (PLAVIX) 75 MG tablet TAKE ONE TABLET BY MOUTH ONCE DAILY WITH BREAKFAST (Patient taking differently: Take 75 mg by mouth daily.)   [DISCONTINUED] ezetimibe (ZETIA) 10 MG tablet TAKE ONE TABLET BY MOUTH DAILY (Patient taking differently: Take 10 mg by mouth daily.)     Allergies:   Patient has no known allergies.   Social History   Socioeconomic History   Marital status: Married    Spouse name: Not on file   Number of children: Not on file   Years of education: Not on file   Highest education level: Not on file  Occupational History   Not on file  Tobacco Use   Smoking status: Former    Types: Cigarettes    Quit date: 06/18/1994    Years since quitting: 27.8   Smokeless tobacco: Never  Vaping Use   Vaping Use: Never used  Substance and Sexual Activity    Alcohol use: No   Drug use: No   Sexual activity: Not on file  Other Topics Concern   Not on file  Social History Narrative   Not on file   Social Determinants of Health   Financial Resource Strain: Not on file  Food Insecurity: Not on file  Transportation Needs: Not on file  Physical Activity: Not on file  Stress: Not on file  Social Connections: Not on file     Family History: The patient's family history includes Depression in his mother; Early death in his brother, father, maternal grandmother, and maternal uncle; Heart attack in his brother, father, and paternal uncle; Heart disease in his brother and father; Heart failure in his brother and father; Hyperlipidemia in his brother and father; Hypertension in his brother and father; Osteoarthritis in his father; Rheum arthritis in his father; Stroke in his maternal grandmother and maternal uncle. ROS:   Please see the history of present illness.    All 14 point review of systems negative except as described per history of present illness  EKGs/Labs/Other Studies Reviewed:      Recent Labs: No results found for requested labs within last 365 days.  Recent Lipid Panel    Component Value Date/Time   LDLDIRECT 100 (H) 10/03/2021 1530    Physical Exam:    VS:  BP 122/64 (BP Location: Right Arm, Patient Position: Sitting)   Pulse 76   Ht  6' (1.829 m)   Wt 253 lb (114.8 kg)   SpO2 95%   BMI 34.31 kg/m     Wt Readings from Last 3 Encounters:  04/15/22 253 lb (114.8 kg)  10/03/21 256 lb 3.2 oz (116.2 kg)  01/08/21 268 lb (121.6 kg)     GEN:  Well nourished, well developed in no acute distress HEENT: Normal NECK: No JVD; No carotid bruits LYMPHATICS: No lymphadenopathy CARDIAC: RRR, no murmurs, no rubs, no gallops RESPIRATORY:  Clear to auscultation without rales, wheezing or rhonchi  ABDOMEN: Soft, non-tender, non-distended MUSCULOSKELETAL:  No edema; No deformity  SKIN: Warm and dry LOWER EXTREMITIES: no  swelling NEUROLOGIC:  Alert and oriented x 3 PSYCHIATRIC:  Normal affect   ASSESSMENT:    1. Atherosclerosis of native coronary artery of native heart with angina pectoris ([place])   2. Chronic diastolic congestive heart failure (HCC)   3. Permanent atrial fibrillation ([place])   4. Small cell carcinoma of left lung ([place])   5. Type 2 diabetes mellitus with diabetic polyneuropathy, without long-term current use of insulin (HCC)    PLAN:    In order of problems listed above:  Cardiovascular preop evaluation for this gentleman with history of coronary artery disease who lives a fairly sedentary lifestyle.  I think we need to evaluate him for activation of coronary artery disease even though he does not have any typical symptoms suggesting it but he is also diabetic which may have no typical symptoms his ability to exercise is limited.  Therefore, I will ask him to have [place].  As a part of evaluation echocardiogram will be done.  If echocardiogram and stress test will be negative then should be no problem to proceed with surgery as scheduled.  He is anticoagulation to be drawn for about 2 days.  Even though it high risk scenario his CHADS2 vascular equals 8. Chronic diastolic congestive heart failure: Compensated continue present management Permanent atrial fibrillation.  Rate controlled.  Anticoagulated continue History of small cell lung CA.  Stable. Parkinson.  He is being treated by neurology.  Look like on the physical exam he does have some involuntary tongue movement.  Look like he does develop tardive dyskinesias.  Again that is followed by internal medicine team and neurology   Medication Adjustments/Labs and Tests Ordered: Current medicines are reviewed at length with the patient today.  Concerns regarding medicines are outlined above.  No orders of the defined types were placed in this encounter.  Medication changes: No orders of the defined types were placed in this  encounter.   Signed, Park Liter, MD, [facility] 04/15/2022 8:25 AM    [place]

## 2022-04-17 ENCOUNTER — Ambulatory Visit: Payer: Medicare Other | Attending: Cardiology

## 2022-04-17 DIAGNOSIS — R0609 Other forms of dyspnea: Secondary | ICD-10-CM

## 2022-04-17 DIAGNOSIS — I25119 Atherosclerotic heart disease of native coronary artery with unspecified angina pectoris: Secondary | ICD-10-CM

## 2022-04-17 DIAGNOSIS — I5032 Chronic diastolic (congestive) heart failure: Secondary | ICD-10-CM | POA: Diagnosis not present

## 2022-04-17 DIAGNOSIS — Z01810 Encounter for preprocedural cardiovascular examination: Secondary | ICD-10-CM | POA: Diagnosis not present

## 2022-04-17 MED ORDER — TECHNETIUM TC 99M TETROFOSMIN IV KIT
10.2000 | PACK | Freq: Once | INTRAVENOUS | Status: AC | PRN
  Administered 2022-04-17: 10.2 via INTRAVENOUS

## 2022-04-17 MED ORDER — REGADENOSON 0.4 MG/5ML IV SOLN
0.4000 mg | Freq: Once | INTRAVENOUS | Status: AC
  Administered 2022-04-17: 0.4 mg via INTRAVENOUS

## 2022-04-17 MED ORDER — TECHNETIUM TC 99M TETROFOSMIN IV KIT
31.6000 | PACK | Freq: Once | INTRAVENOUS | Status: AC | PRN
  Administered 2022-04-17: 31.6 via INTRAVENOUS

## 2022-04-18 LAB — MYOCARDIAL PERFUSION IMAGING
LV dias vol: 92 mL (ref 62–150)
LV sys vol: 30 mL
Nuc Stress EF: 67 %
Peak HR: 76 {beats}/min
Rest HR: 49 {beats}/min
Rest Nuclear Isotope Dose: 10.2 mCi
SDS: 6
SRS: 3
SSS: 9
ST Depression (mm): 0 mm
Stress Nuclear Isotope Dose: 31.6 mCi
TID: 0.75

## 2022-04-30 ENCOUNTER — Ambulatory Visit: Payer: Medicare Other

## 2022-05-13 ENCOUNTER — Ambulatory Visit: Payer: Medicare Other | Attending: Cardiology

## 2022-05-13 DIAGNOSIS — R0609 Other forms of dyspnea: Secondary | ICD-10-CM

## 2022-05-13 LAB — ECHOCARDIOGRAM COMPLETE
Area-P 1/2: 4.63 cm2
S' Lateral: 3.4 cm

## 2022-05-13 MED ORDER — PERFLUTREN LIPID MICROSPHERE
1.0000 mL | INTRAVENOUS | Status: AC | PRN
  Administered 2022-05-13: 8 mL via INTRAVENOUS

## 2022-05-21 ENCOUNTER — Telehealth: Payer: Self-pay

## 2022-05-21 NOTE — Telephone Encounter (Signed)
LVM

## 2022-05-21 NOTE — Telephone Encounter (Signed)
Spoke with Spouse per DPR-per Dr. Wendy Poet note regarding Normal Echo results. She wanted to know if this clears him for his shoulder surgery.

## 2022-05-23 ENCOUNTER — Ambulatory Visit: Payer: Medicare Other | Attending: Cardiology | Admitting: Cardiology

## 2022-05-23 ENCOUNTER — Encounter: Payer: Self-pay | Admitting: Cardiology

## 2022-05-23 VITALS — BP 100/60 | HR 56 | Ht 72.0 in | Wt 242.8 lb

## 2022-05-23 DIAGNOSIS — I4821 Permanent atrial fibrillation: Secondary | ICD-10-CM

## 2022-05-23 DIAGNOSIS — I5032 Chronic diastolic (congestive) heart failure: Secondary | ICD-10-CM

## 2022-05-23 DIAGNOSIS — Z01810 Encounter for preprocedural cardiovascular examination: Secondary | ICD-10-CM | POA: Diagnosis not present

## 2022-05-23 DIAGNOSIS — I25119 Atherosclerotic heart disease of native coronary artery with unspecified angina pectoris: Secondary | ICD-10-CM | POA: Diagnosis not present

## 2022-05-23 NOTE — Patient Instructions (Signed)

## 2022-05-23 NOTE — Telephone Encounter (Signed)
Appointment made for 05/23/22 to discuss stress test.

## 2022-05-23 NOTE — Progress Notes (Signed)
Cardiology Office Note:    Date:  05/23/2022   ID:  Travis Watts, DOB 1945-09-21, MRN [id number]  PCP:  [name], PA-C  Cardiologist:  [name], MD    Referring MD: [name], *   Chief Complaint  Patient presents with   Results    Here to be cleared for Right shoulder surgery    History of Present Illness:    Travis Watts is a 76 y.o. male with past medical history significant for coronary artery disease, status post coronary artery bypass graft many years ago, permanent atrial fibrillation he is anticoagulated, status post lung resection secondary to lung cancer, diabetes, essential hypertension, dyslipidemia, Parkinson.  He was referred to [place] for evaluation before elective right shoulder surgery.  He did have echocardiogram that showed preserved left ventricle ejection fraction, stress test however shows small area of mild ischemia involving anterior lateral wall.  He is here to talk about this.  Likely he does not have any symptoms.  There is no chest pain, tightness, pressure, burning in the chest.  He however lives a very sedentary lifestyle he clearly does not reach 4 METS.  Past Medical History:  Diagnosis Date   A-fib [facility])    Abnormal nuclear stress test 06/12/2016   Formatting of this note might be different from the original. Added automatically from request for surgery [id number]   Acquired hypothyroidism 06/05/2016   Last Assessment & Plan:  Formatting of this note might be different from the original. [place] does labs and they will send copies never   Anxiety    Anxiety state 06/05/2016   Arthritis    previous to joint replacement - knees & shoulder.  Pt.'s R foot fused, traumatized from exposure to agent [place]   Atherosclerosis of native coronary artery of native heart with angina pectoris ([place]) 06/12/2016   Overview:  Added automatically from request for surgery [id number]   Atrial fibrillation ([place]) 04/25/2015   Overview:  Chads  score of 4, anticoagulated with warfarin  Formatting of this note might be different from the original. Advised to go to 2 daily and recheck 10 days  Last Assessment & Plan:  Formatting of this note might be different from the original. On the Eliquis now and denies adverse issues Formatting of this note might be different from the original. Overview:  Chads score of 4, anticoagul   Benign prostatic hyperplasia with urinary frequency 08/22/2020   Last Assessment & Plan:  Formatting of this note might be different from the original. Suspect most likely Doubt florinef but ?? As steroid and could stop as was intitially started for hypotension and that is not longer the case On very low dose at 0.1 and see what happens with stopping that Already on flomax and hesitant to do a bunch of meds and urology referral until his heart situation is sett   Body mass index (BMI) 40.0-44.9, adult ([place]) 03/11/2019   Callus 08/29/2020   Cancer ([place])    lung   CHF (congestive heart failure) ([place])    Chronic anticoagulation 07/27/2018   Last Assessment & Plan:  Formatting of this note might be different from the original. Is on the eliquis and no concerns GFR > 60   Chronic atrial fibrillation ([place]) 04/25/2015   Overview:  Chads score of 4, anticoagulated with warfarin  Formatting of this note might be different from the original. Advised to go to 2 daily and recheck 10 days  Last Assessment & Plan:  Formatting of this note might be different from the original. On the Eliquis now and denies adverse issues Formatting of this note might be different from the original. Overview:  Chads score of 4, anticoagul   Chronic diastolic congestive heart failure ([place]) 03/05/2018   Last Assessment & Plan:  Formatting of this note might be different from the original. Sees cardio and is UTD and feels well overall   Class 2 severe obesity due to excess calories with serious comorbidity in adult [facility]) 03/05/2018   Last Assessment & Plan:   Formatting of this note might be different from the original. Is down 7 pounds  ( on purpose ) and is working on it   Coronary artery disease    Coronary artery disease involving native coronary artery of native heart 09/07/2015   Overview:  Left main coronary artery stenting known in March 2017 Cardiac catheterization December 2017 showed patent stent   Coronary atherosclerosis of native coronary artery 09/07/2015   Formatting of this note might be different from the original. Overview:  Left main coronary artery stenting known in March 2017 Cardiac catheterization December 2017 showed patent stent  Overview:  Added automatically from request for surgery [id number] Formatting of this note might be different from the original. Left main coronary artery stenting known in March 2017 Cardiac catheterization December   Dementia without behavioral disturbance ([place]) 11/30/2018   Formatting of this note might be different from the original. Could be sequlae of the vesicare and try to stop it Also, mouth is very dry  F/U sxs p/w  Last Assessment & Plan:  Formatting of this note might be different from the original. He is doing about as expected Some loss and likel a component of Pd as well and polypharmacy already an issue  [facility] is [facility]   Depression    Diabetes mellitus without complication ([place])    diagnosed- 24yrs.    Diabetic neuropathy ([place]) 08/29/2020   Dizziness 10/19/2017   Dyspnea    Dyspnea on exertion 07/27/2018   Last Assessment & Plan:  Formatting of this note might be different from the original. Follwing cardio Seen and had tests and has F/U   Dysrhythmia    Dystrophic nail 08/29/2020   Episode of recurrent major depressive disorder ([place]) 11/30/2018   Formatting of this note might be different from the original. Doing better with the Abilify Per wife  Will continue on and 6 weeks f/u  Last Assessment & Plan:  Formatting of this note might be different from the original. No changes and on SNRI and  atypical   Essential (primary) hypertension 04/25/2015   Last Assessment & Plan:  Formatting of this note might be different from the original. Perfect and has had some dose reduction   Essential hypertension 04/25/2015   Last Assessment & Plan:  Formatting of this note might be different from the original. Perfect and has had some dose reduction   Ex-smoker 08/20/2015   GERD (gastroesophageal reflux disease)    Hip pain 08/01/2019   History of pneumonectomy 03/17/2018   Hypercholesterolemia 06/05/2016   Hyperlipidemia    Hypertension    Hypomagnesemia 08/29/2020   Malignant neoplasm of bronchus and lung ([place]) 08/29/2020   Mar 16, 2012 Entered By: Sydnee Cabal Comment: getting surgery locally   Memory difficulty 07/18/2020   Mixed hyperlipidemia 04/25/2015   Myofascial pain 04/11/2019   Neuromuscular disorder (HCC)    neuropathy- feet & legs   OSA (obstructive sleep apnea) 07/11/2016   Other  secondary scoliosis, site unspecified 03/11/2019   Other testicular hypofunction 08/29/2020   Parkinson disease 6 weeks ago   Parkinson's disease 05/22/2020   Pneumonia    several times, but treated from his home, not hosp.    Pre-operative clearance 06/17/2017   Primary malignant neoplasm of left lung ([place]) 01/13/2017   Rectal cancer ([place]) 03/17/2017   Sacroiliitis ([place]) 04/11/2019   Shortness of breath 10/07/2017   Sleep apnea    not able to CPAP, since lung beiing removed- wife reports no problem.    Small cell carcinoma of left lung ([place]) 04/25/2015   Spinal stenosis of lumbar region 06/03/2017   Spondylolisthesis of lumbar region 06/25/2017   Stroke (cerebrum) [facility])    as a 76 y.o.    Suicide and self-inflicted injury by hanging, strangulation, and suffocation 08/29/2020   Type 2 diabetes mellitus with diabetic polyneuropathy, without long-term current use of insulin ([place]) 03/05/2018   diagnosed- 86yrs.  Last Assessment & Plan:  Formatting of this note might be different from the original. Excellent control  Would check every 6 months--3 months ago was 7   Type 2 diabetes mellitus without complication ([place]) 56/07/1306   Venous (peripheral) insufficiency 08/29/2020    Past Surgical History:  Procedure Laterality Date   ANKLE FUSION     CARDIAC CATHETERIZATION     EYE SURGERY Left    cataract removed    JOINT REPLACEMENT Left 2016   shoulder replacement    LUNG LOBECTOMY Left 2016   done at [place]      Current Medications: Current Meds  Medication Sig   albuterol (PROVENTIL) (2.5 MG/3ML) 0.083% nebulizer solution Inhale 2.5 mg into the lungs as needed for wheezing.   Alogliptin Benzoate 25 MG TABS Take 1 tablet by mouth daily. In the morning'   apixaban (ELIQUIS) 5 MG TABS tablet Take 5 mg by mouth 2 (two) times daily.   ARIPiprazole (ABILIFY) 5 MG tablet Take 1 tablet by mouth daily.   ascorbic acid (VITAMIN C) 500 MG tablet Take 500 mg by mouth daily.   aspirin 81 MG chewable tablet Chew 81 mg by mouth daily.   atorvastatin (LIPITOR) 80 MG tablet Take 40 mg by mouth daily.    Cholecalciferol (VITAMIN D) 2000 units tablet Take 4,000 Units by mouth daily.   cyanocobalamin (,VITAMIN B-12,) 1000 MCG/ML injection Inject 1 mL into the skin every 30 (thirty) days.   digoxin (LANOXIN) 0.25 MG tablet Take 0.25 mg by mouth at bedtime.    DULoxetine (CYMBALTA) 60 MG capsule Take 120 mg by mouth daily after breakfast.   empagliflozin (JARDIANCE) 25 MG TABS tablet Take 0.5 tablets by mouth daily.   ferrous sulfate 325 (65 FE) MG EC tablet Take 325 mg by mouth every other day.   finasteride (PROSCAR) 5 MG tablet Take 5 mg by mouth daily.   gabapentin (NEURONTIN) 400 MG capsule Take 400-800 mg by mouth See admin instructions. Take 400 mg by mouth in the morning and take 800 mg by mouth at bedtime   galantamine (RAZADYNE) 4 MG tablet Take 4 mg by mouth 2 (two) times daily as needed (dementia). dementia   glipiZIDE (GLUCOTROL) 10 MG  tablet Take 10 mg by mouth 2 (two) times daily.   isosorbide mononitrate (IMDUR) 30 MG 24 hr tablet Take 1 tablet (30 mg total) by mouth daily.   levothyroxine (SYNTHROID) 100 MCG tablet Take 1 tablet by  mouth daily.   lisinopril (PRINIVIL,ZESTRIL) 20 MG tablet Take 20 mg by mouth daily.   magnesium oxide (MAG-OX) 400 MG tablet Take 420 mg by mouth daily.   metoprolol tartrate (LOPRESSOR) 50 MG tablet Take 25 mg by mouth 2 (two) times daily.   Multiple Vitamin (MULTIVITAMIN ADULT PO) Take 1 tablet by mouth daily. Unknown strength   omeprazole (PRILOSEC OTC) 20 MG tablet Take 20 mg by mouth at bedtime.   QUEtiapine (SEROQUEL) 100 MG tablet Take 150 mg by mouth at bedtime.    Semaglutide (OZEMPIC, 0.25 OR 0.5 MG/DOSE, [place]) Inject 0.25 mg into the skin once a week.   solifenacin (VESICARE) 5 MG tablet Take 5 mg by mouth daily.    tamsulosin (FLOMAX) 0.4 MG CAPS capsule Take 0.4 mg by mouth at bedtime.   vitamin C (ASCORBIC ACID) 250 MG tablet Take 500 mg by mouth daily.   zinc gluconate 50 MG tablet Take 1 tablet by mouth at bedtime.     Allergies:   Patient has no known allergies.   Social History   Socioeconomic History   Marital status: Married    Spouse name: Not on file   Number of children: Not on file   Years of education: Not on file   Highest education level: Not on file  Occupational History   Not on file  Tobacco Use   Smoking status: Former    Types: Cigarettes    Quit date: 06/18/1994    Years since quitting: 27.9   Smokeless tobacco: Never  Vaping Use   Vaping Use: Never used  Substance and Sexual Activity   Alcohol use: No   Drug use: No   Sexual activity: Not on file  Other Topics Concern   Not on file  Social History Narrative   Not on file   Social Determinants of Health   Financial Resource Strain: Not on file  Food Insecurity: Not on file  Transportation Needs: Not on file  Physical Activity: Not on file  Stress: Not on file  Social Connections: Not  on file     Family History: The patient's family history includes Depression in his mother; Early death in his brother, father, maternal grandmother, and maternal uncle; Heart attack in his brother, father, and paternal uncle; Heart disease in his brother and father; Heart failure in his brother and father; Hyperlipidemia in his brother and father; Hypertension in his brother and father; Osteoarthritis in his father; Rheum arthritis in his father; Stroke in his maternal grandmother and maternal uncle. ROS:   Please see the history of present illness.    All 14 point review of systems negative except as described per history of present illness  EKGs/Labs/Other Studies Reviewed:      Recent Labs: No results found for requested labs within last 365 days.  Recent Lipid Panel    Component Value Date/Time   LDLDIRECT 100 (H) 10/03/2021 1530    Physical Exam:    VS:  BP 100/60 (BP Location: Left Arm, Patient Position: Sitting)   Pulse (!) 56   Ht 6' (1.829 m)   Wt 242 lb 12.8 oz (110.1 kg)   SpO2 98%   BMI 32.93 kg/m     Wt Readings from Last 3 Encounters:  05/23/22 242 lb 12.8 oz (110.1 kg)  04/17/22 253 lb (114.8 kg)  04/15/22 253 lb (114.8 kg)     GEN:  Well nourished, well developed in no acute distress HEENT: Normal NECK: No JVD; No carotid bruits  LYMPHATICS: No lymphadenopathy CARDIAC: RRR, no murmurs, no rubs, no gallops RESPIRATORY:  Clear to auscultation without rales, wheezing or rhonchi  ABDOMEN: Soft, non-tender, non-distended MUSCULOSKELETAL:  No edema; No deformity  SKIN: Warm and dry LOWER EXTREMITIES: no swelling NEUROLOGIC:  Alert and oriented x 3 PSYCHIATRIC:  Normal affect   ASSESSMENT:    1. Preop cardiovascular exam   2. Atherosclerosis of native coronary artery of native heart with angina pectoris ([place])   3. Permanent atrial fibrillation ([place])   4. Chronic diastolic congestive heart failure (HCC)    PLAN:    In order of problems listed  above:  Cardiovascular preop evaluation had a long discussion with him as well as with his wife because of very complex multiple conditions that he have I will consider him as intermediate intermediate risk for surgery from a cardiac point of view.  He tells me that he suffers so much with this pain that he does note care much about risk he simply want to have a relief from the pain.  I told him to discuss with his wife as well with orthopedic surgery if they want to proceed.  From my point of view I do not see a role for cardiac catheterization here since he really truly have no symptoms.  He is on appropriate medications.  His Eliquis will have to be with hold before surgery he agreed to proceed with surgical intervention. Permanent atrial fibrillation rate control anticoagulated which I will continue Chronic diastolic congestive heart failure appears to be compensated.   Medication Adjustments/Labs and Tests Ordered: Current medicines are reviewed at length with the patient today.  Concerns regarding medicines are outlined above.  No orders of the defined types were placed in this encounter.  Medication changes: No orders of the defined types were placed in this encounter.   Signed, Park Liter, MD, [facility] 05/23/2022 3:31 PM    [place]

## 2022-05-23 NOTE — Telephone Encounter (Signed)
Patient's wife returning call. 

## 2022-10-22 DEATH — deceased
